# Patient Record
Sex: Male | Born: 1935 | Race: White | Hispanic: No | Marital: Married | Smoking: Never smoker
Health system: Southern US, Community
[De-identification: ages and names within clinical notes are randomized; demographics above are authoritative.]

## PROBLEM LIST (undated history)

## (undated) DIAGNOSIS — K4090 Unilateral inguinal hernia, without obstruction or gangrene, not specified as recurrent: Secondary | ICD-10-CM

## (undated) DIAGNOSIS — C801 Malignant (primary) neoplasm, unspecified: Secondary | ICD-10-CM

## (undated) DIAGNOSIS — J449 Chronic obstructive pulmonary disease, unspecified: Secondary | ICD-10-CM

## (undated) DIAGNOSIS — R251 Tremor, unspecified: Secondary | ICD-10-CM

## (undated) DIAGNOSIS — M21372 Foot drop, left foot: Secondary | ICD-10-CM

## (undated) DIAGNOSIS — M1990 Unspecified osteoarthritis, unspecified site: Secondary | ICD-10-CM

## (undated) DIAGNOSIS — H9190 Unspecified hearing loss, unspecified ear: Secondary | ICD-10-CM

## (undated) HISTORY — PX: WRIST FRACTURE SURGERY: SHX121

## (undated) HISTORY — PX: HEMORRHOID SURGERY: SHX153

## (undated) HISTORY — PX: EYE SURGERY: SHX253

## (undated) HISTORY — PX: ANKLE FRACTURE SURGERY: SHX122

---

## 1998-03-24 HISTORY — PX: JOINT REPLACEMENT: SHX530

## 2007-03-25 HISTORY — PX: ANKLE FRACTURE SURGERY: SHX122

## 2007-09-09 ENCOUNTER — Emergency Department: Payer: Self-pay | Admitting: Emergency Medicine

## 2007-12-02 ENCOUNTER — Inpatient Hospital Stay (HOSPITAL_COMMUNITY): Admission: EM | Admit: 2007-12-02 | Discharge: 2007-12-07 | Payer: Self-pay | Admitting: Emergency Medicine

## 2007-12-07 ENCOUNTER — Encounter: Payer: Self-pay | Admitting: Internal Medicine

## 2010-08-06 NOTE — Discharge Summary (Signed)
NAME:  Steven Greene, Steven Greene NO.:  [id number]   MEDICAL RECORD NO.:  [id number]          PATIENT TYPE:  INP   LOCATION:  1614                         FACILITY:  [facility]   PHYSICIAN:  Burnard Bunting, M.D.    DATE OF BIRTH:  1920-01-26   DATE OF ADMISSION:  12/02/2007  DATE OF DISCHARGE:  12/07/2007                               DISCHARGE SUMMARY   ADMISSION DIAGNOSES:  1. Trimalleolar ankle fracture.  2. Status post right total hip arthroplasty.  3. History of prostate cancer treated nonsurgically.  4. Arthritis.  5. Benign tremor.  6. Questionable spinal stenosis with chronic left foot drop.  7. Recent left distal radius fracture treated with casting.   DISCHARGE DIAGNOSES:  1. Trimalleolar ankle fracture.  2. Status post right total hip arthroplasty.  3. History of prostate cancer treated nonsurgically.  4. Arthritis.  5. Benign tremor.  6. Questionable spinal stenosis with chronic left foot drop.  7. Recent left distal radius fracture treated with casting.   PROCEDURE:  On December 02, 2007, patient underwent open reduction and  internal fixation of left trimalleolar ankle fracture performed by Dr.  August Saucer under general anesthesia.   CONSULTATIONS:  None.   BRIEF HISTORY:  The patient is an 77 year old male who fell injuring his  left ankle on the day of admission.  He apparently was going up some  stairs, and he does have a chronic left foot drop when his foot caught  on the stairs and he fell injuring his left ankle.  He was brought to  [facility] Emergency Room.  Evaluation showed obvious deformity of the  left ankle.  Radiographs indicated a displaced trimalleolar ankle  fracture dislocation.  It was felt that he would require surgical  intervention and was admitted for the procedure as stated above.   BRIEF HOSPITAL COURSE:  The patient tolerated the procedure under  general anesthesia without complications.  Postoperatively, he was  started on Coumadin  for DVT prophylaxis.  Adjustments in Coumadin dose  were made according to daily pro times by the pharmacist at [facility].  At discharge, INR was noted to be 1.1, and his dose of Coumadin at  discharge was recommended to be 7.5 mg daily until he was therapeutic  with an INR of 2 to 2.5.  Neurovascular motor function of the left toes  was intact throughout the hospital stay.  Splint was intact.  Patient's  vital signs were stable throughout the hospital stay.  Patient underwent  physical therapy evaluation for bed to chair transfers as well as  ambulation.  He was nonweightbearing to the operative extremity.  He did  require use of a platform walker to the left upper extremity as he has  recently had a left distal radius fracture.  Patient did ambulate as  much as 45 feet utilizing the left platform rolling walker and was able  to maintain the nonweightbearing status of the left foot with verbal  cueing.  He did require moderate assistance.  Patient received a  clinical social work consult in regards to discharge planning.  Being  that he did live alone, it was felt he may benefit from prolonged  rehabilitation at outlying nursing facility.  Physical therapy did  recommend that he have skilled nursing facility to allow for 24-hour  care for his safety.  FL-2 form was signed and faxed to area nursing  facilities.  A bed offer was made at [facility] which patient accepted on December 07, 2007.  At the time of  discharge, patient was taking a regular diet.  He was having regular  bowel movements and was urinating without difficulty.   LABORATORY VALUES:  Pertinent laboratory values on admission:  CBC with  hemoglobin 12.9, hematocrit 38; hemoglobin and hematocrit  postoperatively 9.8 and 28.9 respectively.  INR at discharge 1.1.  Chemistry studies on admission with sodium 133, glucose 105.  Repeat  chemistries on 12/03/2007:  Sodium 129, chloride 94, glucose  133 and  calcium 8.3.   EKG on admission showed normal sinus rhythm, left axis deviation  confirmed by Dr. Margarita Grizzle.   PLAN:  Patient will be transferred to [facility].  There, he should receive physical therapy on a daily basis.  He will  utilize a platform rolling walker to the left upper extremity.  He is  nonweightbearing on the left lower extremity.  Patient should have  ambulation and gait training.  His splint should be kept dry, clean and  intact at all times.  Patient should receive occupational therapy for  ADLs if he does plan to return home on an independent basis once he is  stable.  Patient is advised to return to clinic to see Dr. August Saucer on  December 10, 2007, and asked to call to arrange the time.  As he is  going to a facility, the facility should assist him with this and also  provide transportation.  Patient should continue on a regular diet.  He  is on Coumadin for DVT prophylaxis.  Dietary adjustments to be made for  this avoiding foods with high content of vitamin K.   OTHER MEDICATIONS AT DISCHARGE:  1. Continue on Coumadin for 4 weeks.  It is recommended that he      receive 5 mg daily.  Weekly blood draws for PT, PTT and INR.  2. Percocet 5/325 one q.4-6 h. as needed for pain.  3. Inderal 20 mg b.i.d.  4. Os-Cal 500 mg p.o. daily.  5. Pepcid 20 mg p.o. b.i.d.  6. Robaxin 500 mg 1 q.8 h. as needed for spasm.  7. Maalox as needed for indigestion, laxative on enema of choice.  8. Colace 1 p.o. b.i.d. for constipation.   All questions encouraged and answered prior to discharge.  If there are  questions or concerns regarding his orthopedic status, please notify Dr.  Diam[facility] office at [phone].      Wende Neighbors, P.A.      Burnard Bunting, M.D.  Electronically Signed    SMV/MEDQ  D:  12/07/2007  T:  12/07/2007  Job:  [id number]

## 2010-08-06 NOTE — Op Note (Signed)
NAME:  Steven Greene, Steven Greene NO.:  [id number]   MEDICAL RECORD NO.:  [id number]          PATIENT TYPE:  INP   LOCATION:  1610                         FACILITY:  [facility]   PHYSICIAN:  Burnard Bunting, M.D.    DATE OF BIRTH:  1919/08/04   DATE OF PROCEDURE:  12/02/2007  DATE OF DISCHARGE:                               OPERATIVE REPORT   PREOPERATIVE DIAGNOSIS:  Left trimalleolar ankle fracture.   POSTOPERATIVE DIAGNOSIS:  Left trimalleolar ankle fracture.   PROCEDURE:  Left trimalleolar ankle fracture open reduction and internal  fixation.   SURGEON:  Burnard Bunting, M.D.   ASSISTANT:  None.   ANESTHESIA:  General endotracheal anesthesia.   ESTIMATED BLOOD LOSS:  Minimal.   INDICATIONS FOR PROCEDURE:  Steven Greene is an 77 year old ambulatory  patient who fell on his left ankle, sustained a closed trimalleolar  ankle fracture, presents now for operative management after adequate  explanation of risks and benefits.   DESCRIPTION OF PROCEDURE:  Patient brought to the operating room where  general endotracheal anesthesia was induced.  Preoperative antibiotics  administered.  Time-out was called.  Left leg was then prepped and  prescrubbed with alcohol and Betadine which was allowed to air dry, then  prepped with DuraPrep solution.  Collier Flowers was covered over the operative  field on both sides of the ankle.  Leg was elevated.  Ankle Esmarch was  utilized for approximately an hour.  Lateral incision was made.  Skin  and subcutaneous tissue was sharply divided.  Periosteal flaps were  elevated off of the fracture site.  Fracture site was irrigated and  reduced on the lateral side and it was held in position with a lag screw  placed from anterior proximal to posterior distal.  Good position of the  screw was achieved.  Good reduction of the fracture was achieved.  A  lateral malleolar plate was then applied with both locking and  nonlocking screws.  The plate fit well on the  distal fibula.  At this  time, incision was thoroughly irrigated.  A medial incision was then  made over the medial malleolar fracture.  Skin and subcutaneous tissue  sharply divided.  Periosteum was moved from the fracture site.  The  fracture was then reduced and 2 malleolar screws were placed across the  fracture site with reasonable purchase obtained.  At this time, under  fluoroscopic guidance, reduction was confirmed in the AP and lateral  planes.  Tourniquet was released.  Bleeding points encountered were  stopped with  electrocautery.  Thorough irrigation was performed on both incisions  which were then closed with 2-0 Vicryl suture and interrupted inverted 3-  0 simple nylon sutures.  Bulky dressing and posterior splint was  applied.  Patient tolerated the procedure well without immediate  complications.      Burnard Bunting, M.D.  Electronically Signed     GSD/MEDQ  D:  12/02/2007  T:  12/03/2007  Job:  [id number]

## 2010-08-06 NOTE — H&P (Signed)
NAME:  Steven Greene NO.:  [id number]   MEDICAL RECORD NO.:  [id number]          PATIENT TYPE:  INP   LOCATION:  1614                         FACILITY:  [facility]   PHYSICIAN:  Burnard Bunting, M.D.    DATE OF BIRTH:  Jul 19, 1919   DATE OF ADMISSION:  12/02/2007  DATE OF DISCHARGE:                              HISTORY & PHYSICAL   CHIEF COMPLAINT:  Left ankle pain.   HISTORY OF PRESENT ILLNESS:  Steven Greene is an active 77 year old [place] foot drop who tripped  while going upstairs today.  He represented obvious deformity and pain  in the left ankle.  He went to [facility] Emergency Room for  evaluation.  He was noted to have a trimalleolar ankle fracture  dislocation and his ankle was splinted.  The patient denies any  orthopedic complaints.   PAST MEDICAL HISTORY:  Notable for:  1. Prostate cancer with nonsurgical treatment elected.  He has had      total hip replacement done at [facility].  2. Recent fracture of the left distal radius.  3. Arthritis.  4. Benign tumor.  5. Questionable spinal problem leading to his left foot drop which was      never essentially worked up.   FAMILY HISTORY:  Positive for father with tremor and alcoholism.  [place]  Walks with a cane occasionally.  Is  independent with ADLs.  Denies any tobacco or drug use.   ALLERGIES:  He has no known drug allergies.   CURRENT MEDICATIONS:  Meloxicam, propranolol, calcium, Zantac.   PRIMARY CARE:  [facility].   All other systems are reviewed and are negative.  No recent fever,  chills, chest pain, shortness of breath.  No history of a DVT in the  family.   PHYSICAL EXAMINATION:  His temperature is 98.5.  Pulse 69.  Respiration  20.  Blood pressure 147/78.  CHEST:  Clear to auscultation.  Heartbeat is regular rate and rhythm.  ABDOMEN:  Benign.  He is well-developed, well-nourished, no acute distress.   Alert and  oriented.  Left lower extremity is splinted.  His foot is perfused.  DP pulse is 1+  out of 4.  EHL 1 out of 5.  Ankle dorsiflexion 0-5.  Tibialis anterior  __________ .   Three views left ankle show a trimalleolar ankle fracture dislocation.  Chest x-ray bibasilar atelectasis.   IMPRESSION:  Left trimalleolar ankle fracture.   PLAN:  Patient is active and walking and patient needs open reduction  with internal fixation.  Risk and benefits discussed with the patient  and family including but not limited to infection, deep vein thrombosis  and decreased ankle mobility, possible death.  All questions answered.      Burnard Bunting, M.D.  Electronically Signed     GSD/MEDQ  D:  12/02/2007  T:  12/03/2007  Job:  [id number]

## 2010-12-23 LAB — PROTIME-INR
INR: 1.1
Prothrombin Time: 14.7

## 2010-12-25 LAB — CBC
Hemoglobin: 9.8 — ABNORMAL LOW
Platelets: 143 — ABNORMAL LOW
Platelets: 204
RDW: 13.8
WBC: 7.1

## 2010-12-25 LAB — BASIC METABOLIC PANEL
Calcium: 8.3 — ABNORMAL LOW
Creatinine, Ser: 1.09
GFR calc non Af Amer: >60
Glucose, Bld: 133 — ABNORMAL HIGH
Sodium: 129 — ABNORMAL LOW

## 2010-12-25 LAB — PROTIME-INR
INR: 1
INR: 1.1
Prothrombin Time: 13.5
Prothrombin Time: 13.6
Prothrombin Time: 14

## 2010-12-25 LAB — DIFFERENTIAL
Eosinophils Absolute: 0.1
Lymphocytes Relative: 14
Lymphs Abs: 1
Neutrophils Relative %: 78 — ABNORMAL HIGH

## 2010-12-25 LAB — POCT I-STAT, CHEM 8
BUN: 13
Creatinine, Ser: 1.1
Hemoglobin: 12.9 — ABNORMAL LOW
Potassium: 5
Sodium: 133 — ABNORMAL LOW

## 2011-05-15 ENCOUNTER — Ambulatory Visit: Payer: Self-pay | Admitting: Oncology

## 2011-05-23 ENCOUNTER — Ambulatory Visit: Payer: Self-pay | Admitting: Oncology

## 2011-06-23 ENCOUNTER — Ambulatory Visit: Payer: Self-pay | Admitting: Oncology

## 2011-08-01 ENCOUNTER — Emergency Department (HOSPITAL_COMMUNITY): Payer: Medicare Other

## 2011-08-01 ENCOUNTER — Encounter (HOSPITAL_COMMUNITY): Payer: Self-pay | Admitting: *Deleted

## 2011-08-01 ENCOUNTER — Emergency Department (HOSPITAL_COMMUNITY)
Admission: EM | Admit: 2011-08-01 | Discharge: 2011-08-01 | Disposition: A | Discharge status: Home / Self Care | Payer: Medicare Other | Attending: Emergency Medicine | Admitting: Emergency Medicine

## 2011-08-01 DIAGNOSIS — S82143A Displaced bicondylar fracture of unspecified tibia, initial encounter for closed fracture: Secondary | ICD-10-CM

## 2011-08-01 DIAGNOSIS — S52509A Unspecified fracture of the lower end of unspecified radius, initial encounter for closed fracture: Secondary | ICD-10-CM | POA: Insufficient documentation

## 2011-08-01 DIAGNOSIS — M25539 Pain in unspecified wrist: Secondary | ICD-10-CM | POA: Insufficient documentation

## 2011-08-01 DIAGNOSIS — Y9229 Other specified public building as the place of occurrence of the external cause: Secondary | ICD-10-CM | POA: Insufficient documentation

## 2011-08-01 DIAGNOSIS — W010XXA Fall on same level from slipping, tripping and stumbling without subsequent striking against object, initial encounter: Secondary | ICD-10-CM | POA: Insufficient documentation

## 2011-08-01 DIAGNOSIS — S52501A Unspecified fracture of the lower end of right radius, initial encounter for closed fracture: Secondary | ICD-10-CM

## 2011-08-01 DIAGNOSIS — S82109A Unspecified fracture of upper end of unspecified tibia, initial encounter for closed fracture: Secondary | ICD-10-CM | POA: Insufficient documentation

## 2011-08-01 DIAGNOSIS — M25569 Pain in unspecified knee: Secondary | ICD-10-CM | POA: Insufficient documentation

## 2011-08-01 HISTORY — DX: Tremor, unspecified: R25.1

## 2011-08-01 LAB — CBC
HCT: 37.6 % — ABNORMAL LOW (ref 39.0–52.0)
MCH: 30.8 pg (ref 26.0–34.0)
MCHC: 33 g/dL (ref 30.0–36.0)
MCV: 93.3 fL (ref 78.0–100.0)
RDW: 14.1 % (ref 11.5–15.5)

## 2011-08-01 LAB — DIFFERENTIAL
Basophils Absolute: 0 10*3/uL (ref 0.0–0.1)
Basophils Relative: 0 % (ref 0–1)
Eosinophils Relative: 1 % (ref 0–5)
Monocytes Absolute: 0.6 10*3/uL (ref 0.1–1.0)
Monocytes Relative: 6 % (ref 3–12)

## 2011-08-01 LAB — POCT I-STAT, CHEM 8
BUN: 17 mg/dL (ref 6–23)
Calcium, Ion: 1.22 mmol/L (ref 1.12–1.32)
HCT: 38 % — ABNORMAL LOW (ref 39.0–52.0)
Hemoglobin: 12.9 g/dL — ABNORMAL LOW (ref 13.0–17.0)
Sodium: 141 mEq/L (ref 135–145)
TCO2: 23 mmol/L (ref 0–100)

## 2011-08-01 MED ORDER — HYDROCODONE-ACETAMINOPHEN 5-325 MG PO TABS
1.0000 | ORAL_TABLET | Freq: Once | ORAL | Status: AC
  Administered 2011-08-01: 1 via ORAL
  Filled 2011-08-01: qty 1

## 2011-08-01 MED ORDER — ONDANSETRON 4 MG PO TBDP
ORAL_TABLET | ORAL | Status: AC
  Administered 2011-08-01: 8 mg via ORAL
  Filled 2011-08-01: qty 2

## 2011-08-01 MED ORDER — ONDANSETRON HCL 4 MG/2ML IJ SOLN: 4.0000 mg | Freq: Once | INTRAMUSCULAR | Status: DC

## 2011-08-01 MED ORDER — HYDROCODONE-ACETAMINOPHEN 5-325 MG PO TABS: 1.0000 | ORAL_TABLET | ORAL | Status: AC | PRN

## 2011-08-01 MED ORDER — MORPHINE SULFATE 4 MG/ML IJ SOLN: 4.0000 mg | Freq: Once | INTRAMUSCULAR | Status: DC

## 2011-08-01 NOTE — ED Notes (Signed)
Patient becoming nauseated with movement.  PA aware.  New orders.

## 2011-08-01 NOTE — Discharge Instructions (Signed)
Cast or Splint Care Casts and splints support injured limbs and keep bones from moving while they heal.  HOME CARE  Keep the cast or splint uncovered during the drying period.   A plaster cast can take 24 to 48 hours to dry.   A fiberglass cast will dry in less than 1 hour.   Do not rest the cast on anything harder than a pillow for 24 hours.   Do not put weight on your injured limb. Do not put pressure on the cast. Wait for your doctor's approval.   Keep the cast or splint dry.   Cover the cast or splint with a plastic bag during baths or wet weather.   If you have a cast over your chest and belly (trunk), take sponge baths until the cast is taken off.   Keep your cast or splint clean. Wash a dirty cast with a damp cloth.   Do not put any objects under your cast or splint. Do not scratch the skin under the cast with an object.   Do not take out the padding from inside your cast.   Exercise your joints near the cast as told by your doctor.   Raise (elevate) your injured limb on 1 or 2 pillows for the first 1 to 3 days.  GET HELP RIGHT AWAY IF:  Your cast or splint cracks.   Your cast or splint is too tight or too loose.   You itch badly under the cast.   Your cast gets wet or has a soft spot.   You have a bad smell coming from the cast.   You get an object stuck under the cast.   Your skin around the cast becomes red or raw.   You have new or more pain after the cast is put on.   You have fluid leaking through the cast.   You cannot move your fingers or toes.   Your fingers or toes turn colors or are cool, painful, or puffy (swollen).   You have tingling or lose feeling (numbness) around the injured area.   You have pain or pressure under the cast.   You have trouble breathing or have shortness of breath.   You have chest pain.  MAKE SURE YOU:  Understand these instructions.   Will watch your condition.   Will get help right away if you are not doing  well or get worse.  Document Released: 07/10/2010 Document Revised: 02/27/2011 Document Reviewed: 07/10/2010 [facility] Patient Information 2012 [place], [place].Knee Fracture, Adult A knee fracture is a break in any of the bones of the lower part of the thigh bone, the upper part of the bones of the lower leg, or of the kneecap. When the bones no longer meet the way they are supposed to it is called a dislocation. Sometimes there can be a dislocation along with fractures. SYMPTOMS  Symptoms may include pain, swelling, inability to bend the knee, deformity of the knee, and inability to walk.  DIAGNOSIS  This problem is usually diagnosed with x-rays. Special studies are sometimes done if a fracture is suspected but cannot be seen on ordinary x-rays. If vessels around the knee are injured, special tests may be done to see what the damage is. TREATMENT   The leg is usually splinted for the first couple of days to allow for swelling. After the swelling is down a cast is put on. Sometimes a cast is put on right away with the sides of the  cast cut to allow the knee to swell. If the bones are in place, this may be all that is needed.   If the bones are out of place, medications for pain are given to allow them to be put back in place. If they are seriously out of place, surgery may be needed to hold the pieces or breaks in place using wires, pins, screws or metal plates.   Generally most fractures will heal in 4 to 6 weeks.  HOME CARE INSTRUCTIONS   Use your crutches as directed.   To lessen the swelling, keep the injured leg elevated while sitting or lying down.   Apply ice to the injury for 15 to 20 minutes, 3 to 4 times per day while awake for 2 days. Put the ice in a plastic bag and place a thin towel between the bag of ice and your cast.   If you have a plaster or fiberglass cast:   Do not try to scratch the skin under the cast using sharp or pointed objects.   Check the skin around the cast  every day. You may put lotion on any red or sore areas.   Keep your cast dry and clean.   If you have a plaster splint:   Wear the splint as directed.   You may loosen the elastic around the splint if your toes become numb, tingle, or turn cold or blue.   Do not put pressure on any part of your cast or splint; it may break. Rest your cast only on a pillow the first 24 hours until it is fully hardened.   Your cast or splint can be protected during bathing with a plastic bag. Do not lower the cast or splint into water.   Only take over-the-counter or prescription medicines for pain, discomfort, or fever as directed by your caregiver.   See your caregiver soon if your cast gets damaged or breaks.   It is very important to keep all follow up appointments. Not following up as directed may result in a worsening of your condition or a failure of the fracture to heal properly.  SEEK IMMEDIATE MEDICAL CARE IF:  You have continued severe pain.   You have more swelling than you did before the cast was put on.   The area below the fracture becomes painful.   Your skin or toenails below the injury turn blue or gray, or feel cold or numb.   There is drainage coming from under the cast.   New, unexplained symptoms develop (drugs used in treatment may produce side effects).  MAKE SURE YOU:   Understand these instructions.   Will watch your condition.   Will get help right away if you are not doing well or get worse.  Document Released: 01/21/2006 Document Revised: 02/27/2011 Document Reviewed: 02/22/2007 [facility] Patient Information 2012 [place], [place].Tibial Fracture, Adult You have a fracture (break in bone) of your tibia. This is the large "shin" bone in your lower leg. These fractures are easily diagnosed with x-rays. TREATMENT  You have a simple fracture which usually will heal without disability. It can be treated with simple immobilization. This means the bone can be held with a  cast or splint in a favorable position until your caregiver feels it is stable (healed well enough). Then you can begin range of motion exercises to keep your knee and ankle limber (moving well). HOME CARE INSTRUCTIONS   Apply ice to the injury for 15 to 20 minutes, 3  to 4 times per day while awake, for 2 days. Put the ice in a plastic bag and place a thin towel between the bag of ice and your cast.   If you have a plaster or fiberglass cast:   Do not try to scratch the skin under the cast using sharp or pointed objects.   Check the skin around the cast every day. You may put lotion on any red or sore areas.   Keep your cast dry and clean.   If you have a plaster splint:   Wear the splint as directed.   You may loosen the elastic around the splint if your toes become numb, tingle, or turn cold or blue.   Do not put pressure on any part of your cast or splint until it is fully hardened.   Your cast or splint can be protected during bathing with a plastic bag. Do not lower the cast or splint into water.   Use crutches as directed.   Only take over-the-counter or prescription medicines for pain, discomfort, or fever as directed by your caregiver.   See your caregiver as directed. It is very important to keep all follow-up referrals and appointments in order to avoid any long-term problems with your leg and ankle including chronic pain, inability to move the ankle normally, failure of the fracture to heal and permanent disability.  SEEK IMMEDIATE MEDICAL CARE IF:   Pain is becoming worse rather than better, or if pain is uncontrolled with medications.   You have increased swelling, pain, or redness in the foot.   You begin to lose feeling in your foot or toes.   You develop a cold or blue foot or toes on the injured side.   You develop severe pain in your injured leg, especially if it is increased with movement of your toes.  Document Released: 12/03/2000 Document Revised:  02/27/2011 Document Reviewed: 06/27/2008 [facility] Patient Information 2012 [place], [place].

## 2011-08-01 NOTE — ED Notes (Signed)
Pt to xray, pt remains clothed, C/o R wrist & R knee injury, palpable pulses, CMS intact, pedal edema noted, +3 pitting, denies pain at this time, declines pain med. Alert, NAD, calm, interactive, skin W&D, neighbor and family at [facility].

## 2011-08-01 NOTE — ED Provider Notes (Signed)
History     CSN: [id number]  Arrival date & time 08/01/11  1836   First MD Initiated Contact with Patient 08/01/11 2003      Chief Complaint  Patient presents with  . Fall  . Wrist Injury    (Consider location/radiation/quality/duration/timing/severity/associated sxs/prior treatment) HPI Comments: Patient here with family after the was at the gas station and went in to pay tripping and falling on his right wrist and right knee - he has a recent history of frequent falls due to spinal stenosis and chronic left foot drop - he denies LOC or passing out before the fall - denies head injury, blurred vision, nausea, vomiting, has sensation distal to the injuries.    Patient is a 78 y.o. male presenting with fall and wrist injury. The history is provided by the patient. No language interpreter was used.  Fall The accident occurred 1 to 2 hours ago. The fall occurred while walking. He fell from a height of 3 to 5 ft. He landed on concrete. There was no blood loss. The point of impact was the right wrist and right knee. The pain is present in the right wrist and right knee. The pain is at a severity of 6/10. The pain is moderate. He was ambulatory at the scene. There was no entrapment after the fall. There was no drug use involved in the accident. There was no alcohol use involved in the accident. Pertinent negatives include no visual change, no fever, no numbness, no abdominal pain, no bowel incontinence, no nausea, no vomiting, no hematuria, no headaches, no hearing loss, no loss of consciousness and no tingling. The symptoms are aggravated by activity.  Wrist Injury  Pertinent negatives include no fever.    Past Medical History  Diagnosis Date  . Tremors of nervous system     History reviewed. No pertinent past surgical history.  History reviewed. No pertinent family history.  History  Substance Use Topics  . Smoking status: Never Smoker   . Smokeless tobacco: Not on file  . Alcohol  Use: No      Review of Systems  Constitutional: Negative for fever.  Gastrointestinal: Negative for nausea, vomiting, abdominal pain and bowel incontinence.  Genitourinary: Negative for hematuria.  Neurological: Negative for tingling, loss of consciousness, numbness and headaches.  All other systems reviewed and are negative.    Allergies  Review of patient's allergies indicates no known allergies.  Home Medications   Current Outpatient Rx  Name Route Sig Dispense Refill  . FLUTICASONE-SALMETEROL 250-50 MCG/DOSE IN AEPB Inhalation Inhale 1 puff into the lungs every 12 (twelve) hours as needed. For shortness of breath    . MELOXICAM 7.5 MG PO TABS Oral Take 7.5 mg by mouth daily.    Marland Kitchen PROPRANOLOL HCL 20 MG PO TABS Oral Take 20 mg by mouth daily.      BP 154/72  Pulse 71  Temp(Src) 98.1 F (36.7 C) (Oral)  Resp 16  SpO2 96%  Physical Exam  Nursing note and vitals reviewed. Constitutional: He is oriented to person, place, and time. He appears well-developed and well-nourished. No distress.  HENT:  Head: Normocephalic and atraumatic.  Right Ear: External ear normal.  Left Ear: External ear normal.  Nose: Nose normal.  Mouth/Throat: Oropharynx is clear and moist. No oropharyngeal exudate.  Eyes: Conjunctivae are normal. Pupils are equal, round, and reactive to light. No scleral icterus.  Neck: Normal range of motion. Neck supple. No spinous process tenderness and no muscular tenderness present.  Cardiovascular: Normal rate, regular rhythm and normal heart sounds.  Exam reveals no gallop and no friction rub.   No murmur heard. Pulmonary/Chest: Effort normal and breath sounds normal. No respiratory distress. He has no wheezes. He has no rales. He exhibits no tenderness.  Abdominal: Soft. Bowel sounds are normal. He exhibits no distension. There is no tenderness.  Musculoskeletal:       Right wrist: He exhibits tenderness, bony tenderness, swelling and deformity. He exhibits  normal range of motion.       Right knee: He exhibits decreased range of motion, swelling, effusion and bony tenderness. tenderness found.  Lymphadenopathy:    He has no cervical adenopathy.  Neurological: He is alert and oriented to person, place, and time. No cranial nerve deficit.  Skin: Skin is warm and dry. No rash noted. No erythema. No pallor.  Psychiatric: He has a normal mood and affect. His behavior is normal. Judgment and thought content normal.    ED Course  Procedures (including critical care time)  Labs Reviewed  CBC - Abnormal; Notable for the following:    RBC 4.03 (*)    Hemoglobin 12.4 (*)    HCT 37.6 (*)    All other components within normal limits  DIFFERENTIAL - Abnormal; Notable for the following:    Neutrophils Relative 82 (*)    Neutro Abs 8.1 (*)    Lymphocytes Relative 11 (*)    All other components within normal limits   Dg Wrist Complete Right  08/01/2011  *RADIOLOGY REPORT*  Clinical Data: Fall, wrist injury  RIGHT WRIST - COMPLETE 3+ VIEW  Comparison: None.  Findings: Four views of the right wrist submitted.  There is diffuse osteopenia.  Mild impacted fracture in distal right radius. This is best visualized on lateral view.  Subtle tiny avulsion fracture of the tip of the ulnar styloid.  IMPRESSION:  There is diffuse osteopenia.  Mild impacted fracture in distal right radius.  This is best visualized on lateral view.  Subtle tiny avulsion fracture of the tip of the ulnar styloid.  Original Report Authenticated By: Natasha Mead, M.D.   Dg Knee Complete 4 Views Right  08/01/2011  *RADIOLOGY REPORT*  Clinical Data: Fall  RIGHT KNEE - COMPLETE 4+ VIEW  Comparison: None.  Findings: Four views of the right knee submitted.  There is diffuse osteopenia.  Mild narrowing of medial joint compartment.  Moderate joint effusion. No displaced fracture or subluxation.  In oblique views there is cortical irregularity and cortical step-off lateral aspect of proximal tibia,  lateral tibial plateau suspicious for fracture.  Clinical correlation is necessary.  Further evaluation with CT scan could be performed.  IMPRESSION: Moderate joint effusion. No displaced fracture or subluxation.  In oblique views there is cortical irregularity and cortical step-off lateral aspect of proximal tibia, lateral tibial plateau suspicious for fracture.  Clinical correlation is necessary.  Further evaluation with CT scan could be performed.  Original Report Authenticated By: Natasha Mead, M.D.     Right tibial plateau fracture Right distal radius fracture    MDM  Patient is elderly gentleman with mechanical fall who presents with right distal radius fracture and small right tibial plateau fracture - Dr. Ignacia Palma has seen the patient with me, Dr. Merlyn Lot will follow the patient for the Colles fracture and Dr. Despina Hick for the tibial plateau fracture.       Izola Price [place], [place] 08/01/11 2339

## 2011-08-01 NOTE — ED Notes (Signed)
Patient with swollen right wrist and swollen right knee after fall.  Patient is CAOx3, HOH.  Patient states his pain is 3/10.  Patient declines any pain medication at this time.

## 2011-08-01 NOTE — ED Notes (Signed)
Patient being placed in gown at this time.

## 2011-08-01 NOTE — ED Notes (Signed)
Pt states that he has drop foot and got tangled up, falling forward and bracing self with right wrist, pt reports hitting right knee on ground also. Pt with swelling noted to right wrist, pt able to make fist and flex extend wrist but this is limited. Pulse good.

## 2011-08-01 NOTE — ED Notes (Signed)
Ortho tech paged  

## 2011-08-02 NOTE — ED Provider Notes (Signed)
Medical screening examination/treatment/procedure(s) were conducted as a shared visit with non-physician practitioner(s) and myself.  I personally evaluated the patient during the encounter   78 year old man fell, injuring the right wrist and right knee.  Exam showed no deformity or point tenderness of the wrist, and x-ray of the right wrist showed an undisplaced colles' fracture.  Discussed with Betha Loa, M.D., on call for hand surgery, who advised use of a volar splint and office followup.  His right knee had a large effusion, and CT of the right knee showed a small lateral tibial plateau fracture.  Discussed this with Ollen Gross, M.D., on call for orthopedics, who advised a knee immobilizer, ice, elevation, and office followup.    Carleene Cooper III, MD 08/02/11 1302

## 2011-08-06 ENCOUNTER — Encounter (HOSPITAL_BASED_OUTPATIENT_CLINIC_OR_DEPARTMENT_OTHER): Payer: Self-pay | Admitting: *Deleted

## 2011-08-06 ENCOUNTER — Other Ambulatory Visit: Payer: Self-pay | Admitting: Orthopedic Surgery

## 2011-08-06 ENCOUNTER — Other Ambulatory Visit: Payer: Self-pay

## 2011-08-06 NOTE — Progress Notes (Signed)
Daughter brought pt in-nice man-fell-fx rt wrist and rt ? Knee-splint on knee and wrist-using walker-daughter says his memory is good, lives alone-cares for self-has an old lt drop ft from spinal stenosis-hx tremors-takes inderal-has never had any cardiac issues -never seen cardiology-has had asthma in past-not used advair in yrs. ekg done-labs done in er 08/01/11

## 2011-08-07 ENCOUNTER — Encounter (HOSPITAL_BASED_OUTPATIENT_CLINIC_OR_DEPARTMENT_OTHER): Payer: Self-pay

## 2011-08-07 ENCOUNTER — Encounter (HOSPITAL_BASED_OUTPATIENT_CLINIC_OR_DEPARTMENT_OTHER): Payer: Self-pay | Admitting: Anesthesiology

## 2011-08-07 ENCOUNTER — Ambulatory Visit (HOSPITAL_BASED_OUTPATIENT_CLINIC_OR_DEPARTMENT_OTHER)
Admission: RE | Admit: 2011-08-07 | Discharge: 2011-08-07 | Disposition: A | Discharge status: Home / Self Care | Payer: Medicare Other | Source: Ambulatory Visit | Attending: Orthopedic Surgery | Admitting: Orthopedic Surgery

## 2011-08-07 ENCOUNTER — Ambulatory Visit (HOSPITAL_BASED_OUTPATIENT_CLINIC_OR_DEPARTMENT_OTHER): Payer: Medicare Other | Admitting: Anesthesiology

## 2011-08-07 ENCOUNTER — Encounter (HOSPITAL_BASED_OUTPATIENT_CLINIC_OR_DEPARTMENT_OTHER)
Admission: RE | Disposition: A | Discharge status: Home / Self Care | Payer: Self-pay | Source: Ambulatory Visit | Attending: Orthopedic Surgery

## 2011-08-07 ENCOUNTER — Encounter (HOSPITAL_BASED_OUTPATIENT_CLINIC_OR_DEPARTMENT_OTHER): Payer: Self-pay | Admitting: Orthopedic Surgery

## 2011-08-07 DIAGNOSIS — W010XXA Fall on same level from slipping, tripping and stumbling without subsequent striking against object, initial encounter: Secondary | ICD-10-CM | POA: Insufficient documentation

## 2011-08-07 DIAGNOSIS — J449 Chronic obstructive pulmonary disease, unspecified: Secondary | ICD-10-CM | POA: Insufficient documentation

## 2011-08-07 DIAGNOSIS — J4489 Other specified chronic obstructive pulmonary disease: Secondary | ICD-10-CM | POA: Insufficient documentation

## 2011-08-07 DIAGNOSIS — S52599A Other fractures of lower end of unspecified radius, initial encounter for closed fracture: Secondary | ICD-10-CM | POA: Insufficient documentation

## 2011-08-07 HISTORY — DX: Foot drop, left foot: M21.372

## 2011-08-07 HISTORY — DX: Unspecified osteoarthritis, unspecified site: M19.90

## 2011-08-07 HISTORY — DX: Chronic obstructive pulmonary disease, unspecified: J44.9

## 2011-08-07 HISTORY — DX: Unspecified hearing loss, unspecified ear: H91.90

## 2011-08-07 HISTORY — DX: Malignant (primary) neoplasm, unspecified: C80.1

## 2011-08-07 LAB — POCT HEMOGLOBIN-HEMACUE: Hemoglobin: 12.5 g/dL — ABNORMAL LOW (ref 13.0–17.0)

## 2011-08-07 SURGERY — OPEN REDUCTION INTERNAL FIXATION (ORIF) DISTAL RADIUS FRACTURE
Anesthesia: Moderate Sedation | Site: Wrist | Laterality: Right | Wound class: Clean

## 2011-08-07 MED ORDER — CEFAZOLIN SODIUM-DEXTROSE 2-3 GM-% IV SOLR: 2.0000 g | Freq: Once | INTRAVENOUS | Status: DC

## 2011-08-07 MED ORDER — LACTATED RINGERS IV SOLN
INTRAVENOUS | Status: DC
  Administered 2011-08-07: 10:00:00 via INTRAVENOUS

## 2011-08-07 MED ORDER — ROPIVACAINE HCL 5 MG/ML IJ SOLN
INTRAMUSCULAR | Status: DC | PRN
  Administered 2011-08-07: 25 mL via EPIDURAL

## 2011-08-07 MED ORDER — FENTANYL CITRATE 0.05 MG/ML IJ SOLN: 25.0000 ug | INTRAMUSCULAR | Status: DC | PRN

## 2011-08-07 MED ORDER — LIDOCAINE HCL 1 % IJ SOLN
INTRAMUSCULAR | Status: DC | PRN
  Administered 2011-08-07: 2 mL via INTRADERMAL

## 2011-08-07 MED ORDER — METOCLOPRAMIDE HCL 5 MG/ML IJ SOLN: 10.0000 mg | Freq: Once | INTRAMUSCULAR | Status: DC | PRN

## 2011-08-07 MED ORDER — CEFAZOLIN SODIUM 1-5 GM-% IV SOLN: 1.0000 g | INTRAVENOUS | Status: DC

## 2011-08-07 MED ORDER — LIDOCAINE HCL (CARDIAC) 20 MG/ML IV SOLN
INTRAVENOUS | Status: DC | PRN
  Administered 2011-08-07: 50 mg via INTRAVENOUS

## 2011-08-07 MED ORDER — HYDROCODONE-ACETAMINOPHEN 5-325 MG PO TABS: ORAL_TABLET | ORAL | Status: DC

## 2011-08-07 MED ORDER — FENTANYL CITRATE 0.05 MG/ML IJ SOLN
INTRAMUSCULAR | Status: DC | PRN
  Administered 2011-08-07: 25 ug via INTRAVENOUS

## 2011-08-07 MED ORDER — FENTANYL CITRATE 0.05 MG/ML IJ SOLN
50.0000 ug | INTRAMUSCULAR | Status: DC | PRN
  Administered 2011-08-07: 50 ug via INTRAVENOUS

## 2011-08-07 MED ORDER — PROPOFOL 10 MG/ML IV EMUL
INTRAVENOUS | Status: DC | PRN
  Administered 2011-08-07: 30 ug/kg/min via INTRAVENOUS

## 2011-08-07 MED ORDER — CHLORHEXIDINE GLUCONATE 4 % EX LIQD: 60.0000 mL | Freq: Once | CUTANEOUS | Status: DC

## 2011-08-07 MED ORDER — MIDAZOLAM HCL 2 MG/2ML IJ SOLN
0.5000 mg | INTRAMUSCULAR | Status: DC | PRN
  Administered 2011-08-07: 1 mg via INTRAVENOUS

## 2011-08-07 SURGICAL SUPPLY — 75 items
BANDAGE CONFORM 3  STR LF (GAUZE/BANDAGES/DRESSINGS) IMPLANT
BANDAGE ELASTIC 3 VELCRO ST LF (GAUZE/BANDAGES/DRESSINGS) ×2 IMPLANT
BANDAGE GAUZE ELAST BULKY 4 IN (GAUZE/BANDAGES/DRESSINGS) ×2 IMPLANT
BIT DRILL 2.0 LNG QUCK RELEASE (BIT) ×1 IMPLANT
BIT DRILL 2.8 QUICK RELEASE (BIT) ×1 IMPLANT
BLADE MINI RND TIP GREEN BEAV (BLADE) IMPLANT
BLADE SURG 10 STRL SS (BLADE) ×2 IMPLANT
BLADE SURG 15 STRL LF DISP TIS (BLADE) ×2 IMPLANT
BLADE SURG 15 STRL SS (BLADE) ×2
BNDG ELASTIC 2 VLCR STRL LF (GAUZE/BANDAGES/DRESSINGS) ×2 IMPLANT
BNDG ESMARK 4X9 LF (GAUZE/BANDAGES/DRESSINGS) ×2 IMPLANT
CHLORAPREP W/TINT 26ML (MISCELLANEOUS) ×2 IMPLANT
CLOTH BEACON ORANGE TIMEOUT ST (SAFETY) ×2 IMPLANT
CORDS BIPOLAR (ELECTRODE) ×2 IMPLANT
COVER MAYO STAND STRL (DRAPES) ×2 IMPLANT
COVER TABLE BACK 60X90 (DRAPES) ×2 IMPLANT
DRAPE EXTREMITY T 121X128X90 (DRAPE) ×2 IMPLANT
DRAPE OEC MINIVIEW 54X84 (DRAPES) ×2 IMPLANT
DRAPE SURG 17X23 STRL (DRAPES) ×2 IMPLANT
DRAPE UTILITY W/TAPE 26X15 (DRAPES) ×2 IMPLANT
DRILL 2.0 LNG QUICK RELEASE (BIT) ×2
DRILL 2.8 QUICK RELEASE (BIT) ×2
GAUZE XEROFORM 1X8 LF (GAUZE/BANDAGES/DRESSINGS) ×2 IMPLANT
GLOVE BIO SURGEON STRL SZ 6.5 (GLOVE) ×4 IMPLANT
GLOVE BIO SURGEON STRL SZ7.5 (GLOVE) ×2 IMPLANT
GLOVE BIOGEL PI IND STRL 7.0 (GLOVE) ×1 IMPLANT
GLOVE BIOGEL PI IND STRL 8 (GLOVE) ×1 IMPLANT
GLOVE BIOGEL PI IND STRL 8.5 (GLOVE) IMPLANT
GLOVE BIOGEL PI INDICATOR 7.0 (GLOVE) ×1
GLOVE BIOGEL PI INDICATOR 8 (GLOVE) ×1
GLOVE BIOGEL PI INDICATOR 8.5 (GLOVE)
GLOVE SURG ORTHO 8.0 STRL STRW (GLOVE) IMPLANT
GOWN PREVENTION PLUS XLARGE (GOWN DISPOSABLE) ×2 IMPLANT
GOWN STRL REIN XL XLG (GOWN DISPOSABLE) ×2 IMPLANT
GUIDEWIRE ORTHO 0.054X6 (WIRE) ×6 IMPLANT
NEEDLE HYPO 22GX1.5 SAFETY (NEEDLE) IMPLANT
NEEDLE HYPO 25X1 1.5 SAFETY (NEEDLE) IMPLANT
NS IRRIG 1000ML POUR BTL (IV SOLUTION) ×2 IMPLANT
PACK BASIN DAY SURGERY FS (CUSTOM PROCEDURE TRAY) ×2 IMPLANT
PAD CAST 3X4 CTTN HI CHSV (CAST SUPPLIES) ×1 IMPLANT
PAD CAST 4YDX4 CTTN HI CHSV (CAST SUPPLIES) ×1 IMPLANT
PADDING CAST ABS 4INX4YD NS (CAST SUPPLIES) ×1
PADDING CAST ABS COTTON 4X4 ST (CAST SUPPLIES) ×1 IMPLANT
PADDING CAST COTTON 3X4 STRL (CAST SUPPLIES) ×1
PADDING CAST COTTON 4X4 STRL (CAST SUPPLIES) ×1
PLATE WIDE RIGHT DISTAL (Plate) ×2 IMPLANT
SCREW CORT 3.5X14 (Screw) ×4 IMPLANT
SCREW CORT 3.5X16 (Screw) ×4 IMPLANT
SCREW CORT 3.5X18 CO (Screw) ×2 IMPLANT
SCREW CORT FT 18X2.3XLCK HEX (Screw) ×2 IMPLANT
SCREW CORTICAL LOCKING 2.3X18M (Screw) ×2 IMPLANT
SCREW CORTICAL LOCKING 2.3X22M (Screw) ×2 IMPLANT
SCREW CORTICAL LOCKING 2.3X24M (Screw) ×2 IMPLANT
SCREW FX22X2.3XLCK SMTH NS CRT (Screw) ×2 IMPLANT
SCREW FX24X2.3XLCK SMTH NS CRT (Screw) ×2 IMPLANT
SCREW SMOOTH PEGGED 2.3X26MM (Screw) ×2 IMPLANT
SLEEVE SCD COMPRESS KNEE MED (MISCELLANEOUS) ×2 IMPLANT
SPLINT PLASTER CAST XFAST 3X15 (CAST SUPPLIES) ×12 IMPLANT
SPLINT PLASTER CAST XFAST 4X15 (CAST SUPPLIES) IMPLANT
SPLINT PLASTER XTRA FAST SET 4 (CAST SUPPLIES)
SPLINT PLASTER XTRA FASTSET 3X (CAST SUPPLIES) ×12
SPONGE GAUZE 4X4 12PLY (GAUZE/BANDAGES/DRESSINGS) ×2 IMPLANT
STOCKINETTE 4X48 STRL (DRAPES) ×2 IMPLANT
SUCTION FRAZIER TIP 10 FR DISP (SUCTIONS) ×2 IMPLANT
SUT ETHILON 3 0 PS 1 (SUTURE) IMPLANT
SUT ETHILON 4 0 PS 2 18 (SUTURE) ×4 IMPLANT
SUT VIC AB 3-0 PS1 18 (SUTURE)
SUT VIC AB 3-0 PS1 18XBRD (SUTURE) IMPLANT
SUT VICRYL 4-0 PS2 18IN ABS (SUTURE) ×2 IMPLANT
SYR BULB 3OZ (MISCELLANEOUS) ×2 IMPLANT
SYR CONTROL 10ML LL (SYRINGE) IMPLANT
TOWEL OR 17X24 6PK STRL BLUE (TOWEL DISPOSABLE) ×4 IMPLANT
TUBE CONNECTING 20X1/4 (TUBING) ×2 IMPLANT
UNDERPAD 30X30 INCONTINENT (UNDERPADS AND DIAPERS) ×2 IMPLANT
WATER STERILE IRR 1000ML POUR (IV SOLUTION) IMPLANT

## 2011-08-07 NOTE — H&P (Signed)
  Steven Greene is an 78 y.o. male.   Chief Complaint: right wrist fracture HPI: 78 yo rhd male fell from standing height 08/01/11.  Seen at [facility] where XR revealed distal radius fracture.  Followed up in office.  Reports no previous injuries to right wrist and no other injuries at this time.  Past Medical History  Diagnosis Date  . Tremors of nervous system   . COPD (chronic obstructive pulmonary disease)   . Asthma   . Arthritis   . Foot drop, left   . HOH (hard of hearing)   . Cancer     prostate cancer-treated medically    Past Surgical History  Procedure Date  . Ankle fracture surgery 2009    lt  . Joint replacement 2000    rt hip replacement  . Hemorrhoid surgery   . Eye surgery     both cataracts  . Ankle fracture surgery     rt    History reviewed. No pertinent family history. Social History:  reports that he has never smoked. He does not have any smokeless tobacco history on file. He reports that he does not drink alcohol or use illicit drugs.  Allergies: No Known Allergies  Medications Prior to Admission  Medication Sig Dispense Refill  . Fluticasone-Salmeterol (ADVAIR) 250-50 MCG/DOSE AEPB Inhale 1 puff into the lungs every 12 (twelve) hours as needed. For shortness of breath      . HYDROcodone-acetaminophen (NORCO) 5-325 MG per tablet Take 1 tablet by mouth every 4 (four) hours as needed for pain.  30 tablet  0  . meloxicam (MOBIC) 7.5 MG tablet Take 7.5 mg by mouth daily.      . propranolol (INDERAL) 20 MG tablet Take 20 mg by mouth daily.        No results found for this or any previous visit (from the past 48 hour(s)).  No results found.   A comprehensive review of systems was negative except for: Eyes: positive for cataracts and contacts/glasses Ears, nose, mouth, throat, and face: positive for hearing loss Respiratory: positive for asthma and cough Hematologic/lymphatic: positive for bleeding Neurological: positive for gait problems and  headaches  Blood pressure 107/68, pulse 67, temperature 98.1 F (36.7 C), temperature source Oral, resp. rate 20, height 5\' 10"  (1.778 m), weight 84.823 kg (187 lb), SpO2 95.00%.  General appearance: alert, cooperative and appears stated age Head: Normocephalic, without obvious abnormality, atraumatic Neck: supple, symmetrical, trachea midline Resp: clear to auscultation bilaterally Cardio: regular rate and rhythm GI: soft, non-tender; bowel sounds normal; no masses,  no organomegaly Extremities: light touch sensation and capillary refill intact all digits.  +epl/fpl/io. Pulses: 2+ and symmetric Skin: eccymosis Neurologic: Grossly normal Incision/Wound: Na  Assessment/Plan Right comminuted intraarticular distal radius fracture.  Discussed non operative and operative treatment options with patient and daughter.  They elected operative fixation.  Risks, benefits, and alternatives of surgery were discussed and the patient agrees with the plan of care.   Cailyn Houdek R 08/07/2011, 10:46 AM

## 2011-08-07 NOTE — Transfer of Care (Signed)
Immediate Anesthesia Transfer of Care Note  Patient: Steven Greene  Procedure(s) Performed: Procedure(s) (LRB): OPEN REDUCTION INTERNAL FIXATION (ORIF) DISTAL RADIAL FRACTURE (Right)  Patient Location: PACU  Anesthesia Type: General  Level of Consciousness: awake  Airway & Oxygen Therapy: Patient Spontanous Breathing and Patient connected to face mask oxygen  Post-op Assessment: Report given to PACU RN and Post -op Vital signs reviewed and stable  Post vital signs: Reviewed and stable  Complications: No apparent anesthesia complications

## 2011-08-07 NOTE — Anesthesia Procedure Notes (Addendum)
Anesthesia Regional Block:  Supraclavicular block  Pre-Anesthetic Checklist: ,, timeout performed, Correct Patient, Correct Site, Correct Laterality, Correct Procedure, Correct Position, site marked, Risks and benefits discussed,  Surgical consent,  Pre-op evaluation,  At surgeon's request and post-op pain management  Laterality: Right  Prep: chloraprep       Needles:   Needle Type: Other   (Arrow Echogenic)   Needle Length: 9cm  Needle Gauge: 21    Additional Needles:  Procedures: ultrasound guided Supraclavicular block Narrative:  Start time: 08/07/2011 9:47 AM End time: 08/07/2011 9:54 AM Injection made incrementally with aspirations every 5 mL.  Performed by: Personally  Anesthesiologist: C Frederick  Additional Notes: Ultrasound guidance used to: id relevant anatomy, confirm needle position, local anesthetic spread, avoidance of vascular puncture. Picture saved. No complications. Block performed personally by Janetta Hora. Gelene Mink, MD    Supraclavicular block Procedure Name: MAC Performed by: York Grice Pre-anesthesia Checklist: Patient identified, Patient being monitored, Emergency Drugs available, Timeout performed and Suction available Patient Re-evaluated:Patient Re-evaluated prior to inductionOxygen Delivery Method: Simple face mask

## 2011-08-07 NOTE — Discharge Instructions (Addendum)
[facility] Instructions °Hand Surgery ° °Wound Care: °Keep your hand elevated above the level of your heart.  Do not allow it to dangle by your side.  Keep the dressing dry and do not remove it unless your doctor advises you to do so.  He will usually change it at the time of your post-op visit.  Moving your fingers is advised to stimulate circulation but will depend on the site of your surgery.  If you have a splint applied, your doctor will advise you regarding movement. ° °Activity: °Do not drive or operate machinery today.  Rest today and then you may return to your normal activity and work as indicated by your physician. ° °Diet:  °Drink liquids today or eat a light diet.  You may resume a regular diet tomorrow.   ° °General expectations: °Pain for two to three days. °Fingers may become slightly swollen. ° °Call your doctor if any of the following occur: °Severe pain not relieved by pain medication. °Elevated temperature. °Dressing soaked with blood. °Inability to move fingers. °White or bluish color to fingers. ° ° °Regional Anesthesia Blocks ° °1. Numbness or the inability to move the "blocked" extremity may last from 3-48 hours after placement. The length of time depends on the medication injected and your individual response to the medication. If the numbness is not going away after 48 hours, call your surgeon. ° °2. The extremity that is blocked will need to be protected until the numbness is gone and the  Strength has returned. Because you cannot feel it, you will need to take extra care to avoid injury. Because it may be weak, you may have difficulty moving it or using it. You may not know what position it is in without looking at it while the block is in effect. ° °3. For blocks in the legs and feet, returning to weight bearing and walking needs to be done carefully. You will need to wait until the numbness is entirely gone and the strength has returned. You should be able to move your leg and foot  normally before you try and bear weight or walk. You will need someone to be with you when you first try to ensure you do not fall and possibly risk injury. ° °4. Bruising and tenderness at the needle site are common side effects and will resolve in a few days. ° °5. Persistent numbness or new problems with movement should be communicated to the surgeon or the [facility] ([phone])/ [facility] ([phone]). ° ° °Post Anesthesia Home Care Instructions ° °Activity: °Get plenty of rest for the remainder of the day. A responsible adult should stay with you for 24 hours following the procedure.  °For the next 24 hours, DO NOT: °-Drive a car °-Operate machinery °-Drink alcoholic beverages °-Take any medication unless instructed by your physician °-Make any legal decisions or sign important papers. ° °Meals: °Start with liquid foods such as gelatin or soup. Progress to regular foods as tolerated. Avoid greasy, spicy, heavy foods. If nausea and/or vomiting occur, drink only clear liquids until the nausea and/or vomiting subsides. Call your physician if vomiting continues. ° °Special Instructions/Symptoms: °Your throat may feel dry or sore from the anesthesia or the breathing tube placed in your throat during surgery. If this causes discomfort, gargle with warm salt water. The discomfort should disappear within 24 hours. ° °

## 2011-08-07 NOTE — Op Note (Signed)
Dictation [phone]

## 2011-08-07 NOTE — Anesthesia Postprocedure Evaluation (Signed)
Anesthesia Post Note  Patient: Steven Greene  Procedure(s) Performed: Procedure(s) (LRB): OPEN REDUCTION INTERNAL FIXATION (ORIF) DISTAL RADIAL FRACTURE (Right)  Anesthesia type: MAC  Patient location: PACU  Post pain: Pain level controlled  Post assessment: Patient's Cardiovascular Status Stable  Last Vitals:  Filed Vitals:   08/07/11 1315  BP: 141/74  Pulse: 82  Temp:   Resp: 20    Post vital signs: Reviewed and stable  Level of consciousness: alert  Complications: No apparent anesthesia complications

## 2011-08-07 NOTE — Anesthesia Preprocedure Evaluation (Signed)
Anesthesia Evaluation  Patient identified by MRN, date of birth, ID band Patient awake    Reviewed: Allergy & Precautions, H&P , NPO status , Patient's Chart, lab work & pertinent test results, reviewed documented beta blocker date and time   Airway Mallampati: II TM Distance: >3 FB Neck ROM: full    Dental   Pulmonary asthma , COPD         Cardiovascular negative cardio ROS      Neuro/Psych  Neuromuscular disease negative psych ROS   GI/Hepatic negative GI ROS, Neg liver ROS,   Endo/Other  negative endocrine ROS  Renal/GU negative Renal ROS  negative genitourinary   Musculoskeletal   Abdominal   Peds  Hematology negative hematology ROS (+)   Anesthesia Other Findings See surgeon's H&P   Reproductive/Obstetrics negative OB ROS                           Anesthesia Physical Anesthesia Plan  ASA: III  Anesthesia Plan: MAC and Regional   Post-op Pain Management:    Induction:   Airway Management Planned: Simple Face Mask  Additional Equipment:   Intra-op Plan:   Post-operative Plan:   Informed Consent: I have reviewed the patients History and Physical, chart, labs and discussed the procedure including the risks, benefits and alternatives for the proposed anesthesia with the patient or authorized representative who has indicated his/her understanding and acceptance.   Dental Advisory Given  Plan Discussed with: CRNA and Surgeon  Anesthesia Plan Comments:         Anesthesia Quick Evaluation

## 2011-08-07 NOTE — Progress Notes (Signed)
Assisted Dr. Frederick with right, ultrasound guided, supraclavicular block. Side rails up, monitors on throughout procedure. See vital signs in flow sheet. Tolerated Procedure well. 

## 2011-08-08 NOTE — Op Note (Signed)
NAME:  Steven Greene, Steven Greene NO.:  [id number]  MEDICAL RECORD NO.:  [id number]  LOCATION:                                 FACILITY:  PHYSICIAN:  Betha Loa, MD        DATE OF BIRTH:  01-Aug-1919  DATE OF PROCEDURE:  08/07/2011 DATE OF DISCHARGE:                              OPERATIVE REPORT   PREOPERATIVE DIAGNOSIS:  Right comminuted intra-articular distal radius fracture.  POSTOPERATIVE DIAGNOSIS:  Right comminuted intra-articular distal radius fracture.  PROCEDURE:  Open reduction and internal fixation of right comminuted intra-articular distal radius fracture.  SURGEON:  Betha Loa, MD  ASSISTANT:  None.  ANESTHESIA:  General regional with sedation.  IV FLUIDS:  Per anesthesia flow sheet.  ESTIMATED BLOOD LOSS:  Minimal.  COMPLICATIONS:  None.  SPECIMENS:  None.  TOURNIQUET TIME:  70 minutes.  DISPOSITION:  Stable to PACU.  INDICATIONS:  Mr. Sturges is a 78 year old right-hand dominant male who fell 6 days ago onto his right wrist from standing height.  He presented to the [facility] Emergency Department where radiographs were taken revealing a comminuted intra-articular distal radius fracture.  He was splinted and followed up with me in the office.  On evaluation, he had intact sensation, capillary refill in all fingertips.  He could flex and extend the IP joint of his thumb and cross his fingers.  I discussed at length with Mr. Mcneal and his daughter the nature of his injury.  We discussed nonoperative and operative treatment measures including immobilization versus open reduction and internal fixation.  Risks, benefits, and alternatives of surgery were discussed including the risk of blood loss; infection; damage to nerves, vessels, tendons, ligaments, bone; failure of surgery; need for additional surgery; complications with wound healing; continued pain; nonunion; malunion; stiffness.  He voiced understanding of these risks and elected to  proceed.  OPERATIVE COURSE:  After being identified preoperatively by myself, the patient and I agreed upon the procedure and site of procedure.  The surgical site was marked.  The risks, benefits, and alternatives of surgery were reviewed and he wished to proceed.  Surgical consent had been signed.  He was given 2 g of IV Ancef as preoperative antibiotic prophylaxis.  A regional block was performed by Anesthesia in preoperative holding.  He was transferred to the operating room and placed on the operating table in supine position with the right upper extremity on an armboard.  Sedation was induced by the anesthesiologist. Right upper extremity was prepped and draped in normal sterile orthopedic fashion.  A surgical pause was performed between the surgeons, anesthesia, and operating room staff, and all were in agreement as to the patient, procedure, and site of procedure.  The tourniquet at the proximal aspect of the extremity was inflated to 250 mmHg after exsanguination of the limb with an Esmarch bandage.  Standard volar Sherilyn Cooter approach was used.  The superficial and deep portions of the FCR tendon sheath were incised.  Bipolar electrocautery was used throughout the case to obtain hemostasis.  The FCR and FPL were retracted ulnarly to protect the palmar cutaneous branch of the median nerve.  The pronator quadratus was  released from the radial side of the radius and elevated.  The brachioradialis was released.  The fracture site was easily identified.  It was somewhat impacted.  It was cleared of soft tissue interposition.  It was brought back out to length.  A wide plate from the [employer] volar distal radial locking set was selected. This was placed onto the radius and secured using the guidepins.  C-arm was used in AP, lateral, and oblique projections to ensure appropriate reduction and position of hardware, which was the case.  The distal holes in the plate were filled with all locking  pegs with the exception of the two styloid screw holes, which were filled with locking screws. The screw had been placed in the slotted hole in the shaft of the plate prior to placing the distal screw holes.  Standard AO drilling measuring technique was used throughout the case.  The remaining holes in the shaft of the plate were then filled again using standard AO drilling measuring technique.  C-arm was used in AP, lateral, and oblique projections to ensure appropriate reduction and position of hardware, which was the case.  There was no intra-articular penetration.  All screws had good purchase.  The wound was copiously irrigated with sterile saline.  The pronator quadratus was repaired back over top of the plate using 4-0 Vicryl suture.  Two inverted interrupted Vicryl sutures were placed in the subcutaneous tissues and the skin was closed with 4-0 nylon in a horizontal mattress fashion.  The wound was dressed with sterile Xeroform, 4x4s, and wrapped with a Kerlix bandage.  A volar splint was placed and wrapped with Kerlix and Ace bandage.  Tourniquet was deflated at 70 minutes.  Fingertips were pink with brisk capillary refill after deflation of the tourniquet.  The patient was awakened from sedation.  The operative drapes were broken down.  He has transferred back to the stretcher and taken to PACU in stable condition.  I will see him back in the office in 1 week for postoperative followup.  I will give him Norco 5/325 1-2 p.o. q.6 hours p.r.n. pain, dispensed #40.     Betha Loa, MD     KK/MEDQ  D:  08/07/2011  T:  08/07/2011  Job:  [id number]

## 2011-09-17 ENCOUNTER — Ambulatory Visit: Payer: Self-pay | Admitting: Oncology

## 2011-09-18 LAB — PSA: PSA: 8.8 ng/mL — ABNORMAL HIGH (ref 0.0–4.0)

## 2011-09-22 ENCOUNTER — Ambulatory Visit: Payer: Self-pay | Admitting: Oncology

## 2012-04-05 ENCOUNTER — Ambulatory Visit: Payer: Self-pay | Admitting: Family Medicine

## 2012-07-08 ENCOUNTER — Ambulatory Visit: Payer: Self-pay | Admitting: Oncology

## 2012-07-09 ENCOUNTER — Ambulatory Visit: Payer: Self-pay | Admitting: Family Medicine

## 2012-07-09 LAB — COMPREHENSIVE METABOLIC PANEL
Bilirubin,Total: 0.5 mg/dL (ref 0.2–1.0)
Calcium, Total: 9.2 mg/dL (ref 8.5–10.1)
Chloride: 101 mmol/L (ref 98–107)
Osmolality: 272 (ref 275–301)
Potassium: 4.5 mmol/L (ref 3.5–5.1)
SGOT(AST): 26 U/L (ref 15–37)
Total Protein: 7.4 g/dL (ref 6.4–8.2)

## 2012-07-09 LAB — LIPASE, BLOOD: Lipase: 78 U/L (ref 73–393)

## 2012-07-09 LAB — CBC WITH DIFFERENTIAL/PLATELET
Basophil %: 0.5 %
Eosinophil #: 0.1 10*3/uL (ref 0.0–0.7)
HCT: 38.5 % — ABNORMAL LOW (ref 40.0–52.0)
Lymphocyte %: 15.1 %
MCH: 31.4 pg (ref 26.0–34.0)
MCV: 94 fL (ref 80–100)
Neutrophil %: 76.1 %
Platelet: 152 10*3/uL (ref 150–440)
RBC: 4.11 10*6/uL — ABNORMAL LOW (ref 4.40–5.90)

## 2012-07-09 LAB — AMYLASE: Amylase: 53 U/L (ref 25–115)

## 2012-07-12 LAB — PSA: PSA: 14.1 ng/mL — ABNORMAL HIGH (ref 0.0–4.0)

## 2012-07-22 ENCOUNTER — Ambulatory Visit: Payer: Self-pay | Admitting: Oncology

## 2012-08-22 ENCOUNTER — Ambulatory Visit: Payer: Self-pay | Admitting: Oncology

## 2013-02-11 ENCOUNTER — Ambulatory Visit: Payer: Self-pay | Admitting: Family Medicine

## 2013-11-10 ENCOUNTER — Ambulatory Visit: Payer: Self-pay | Admitting: Family Medicine

## 2014-03-22 ENCOUNTER — Inpatient Hospital Stay: Payer: Self-pay | Admitting: Internal Medicine

## 2014-03-22 LAB — PROTIME-INR
INR: 0.8
Prothrombin Time: 11.5 secs (ref 11.5–14.7)

## 2014-03-22 LAB — CBC
HCT: 34 % — AB (ref 40.0–52.0)
HGB: 11 g/dL — ABNORMAL LOW (ref 13.0–18.0)
MCH: 31.3 pg (ref 26.0–34.0)
MCHC: 32.5 g/dL (ref 32.0–36.0)
MCV: 96 fL (ref 80–100)
Platelet: 160 10*3/uL (ref 150–440)
RBC: 3.53 10*6/uL — ABNORMAL LOW (ref 4.40–5.90)
RDW: 14.3 % (ref 11.5–14.5)
WBC: 6.6 10*3/uL (ref 3.8–10.6)

## 2014-03-22 LAB — COMPREHENSIVE METABOLIC PANEL
ALBUMIN: 3.3 g/dL — AB (ref 3.4–5.0)
ALK PHOS: 80 U/L
ALT: 21 U/L
AST: 32 U/L (ref 15–37)
Anion Gap: 5 — ABNORMAL LOW (ref 7–16)
BUN: 19 mg/dL — AB (ref 7–18)
Bilirubin,Total: 0.3 mg/dL (ref 0.2–1.0)
CALCIUM: 8.8 mg/dL (ref 8.5–10.1)
CHLORIDE: 105 mmol/L (ref 98–107)
Co2: 28 mmol/L (ref 21–32)
Creatinine: 0.9 mg/dL (ref 0.60–1.30)
EGFR (Non-African Amer.): >60
GLUCOSE: 107 mg/dL — AB (ref 65–99)
OSMOLALITY: 278 (ref 275–301)
Potassium: 4.6 mmol/L (ref 3.5–5.1)
Sodium: 138 mmol/L (ref 136–145)
TOTAL PROTEIN: 6.6 g/dL (ref 6.4–8.2)

## 2014-03-23 LAB — CBC WITH DIFFERENTIAL/PLATELET
Basophil #: 0 10*3/uL (ref 0.0–0.1)
Basophil %: 1.4 %
EOS ABS: 0.4 10*3/uL (ref 0.0–0.7)
Eosinophil %: 10.4 %
HCT: 25.8 % — ABNORMAL LOW (ref 40.0–52.0)
HGB: 8.4 g/dL — ABNORMAL LOW (ref 13.0–18.0)
LYMPHS ABS: 1.2 10*3/uL (ref 1.0–3.6)
LYMPHS PCT: 34.4 %
MCH: 31.1 pg (ref 26.0–34.0)
MCHC: 32.8 g/dL (ref 32.0–36.0)
MCV: 95 fL (ref 80–100)
MONO ABS: 0.4 x10 3/mm (ref 0.2–1.0)
Monocyte %: 11.7 %
NEUTROS ABS: 1.5 10*3/uL (ref 1.4–6.5)
NEUTROS PCT: 42.1 %
Platelet: 114 10*3/uL — ABNORMAL LOW (ref 150–440)
RBC: 2.72 10*6/uL — ABNORMAL LOW (ref 4.40–5.90)
RDW: 14.2 % (ref 11.5–14.5)
WBC: 3.6 10*3/uL — ABNORMAL LOW (ref 3.8–10.6)

## 2014-03-23 LAB — BASIC METABOLIC PANEL
Anion Gap: 1 — ABNORMAL LOW (ref 7–16)
BUN: 16 mg/dL (ref 7–18)
CALCIUM: 8.4 mg/dL — AB (ref 8.5–10.1)
CHLORIDE: 109 mmol/L — AB (ref 98–107)
CREATININE: 0.98 mg/dL (ref 0.60–1.30)
Co2: 31 mmol/L (ref 21–32)
EGFR (African American): >60
EGFR (Non-African Amer.): >60
Glucose: 102 mg/dL — ABNORMAL HIGH (ref 65–99)
Osmolality: 283 (ref 275–301)
Potassium: 4.2 mmol/L (ref 3.5–5.1)
SODIUM: 141 mmol/L (ref 136–145)

## 2014-03-23 LAB — HEMOGLOBIN: HGB: 8.2 g/dL — ABNORMAL LOW (ref 13.0–18.0)

## 2014-03-24 DIAGNOSIS — K625 Hemorrhage of anus and rectum: Secondary | ICD-10-CM | POA: Diagnosis not present

## 2014-03-24 DIAGNOSIS — K5730 Diverticulosis of large intestine without perforation or abscess without bleeding: Secondary | ICD-10-CM | POA: Diagnosis not present

## 2014-03-24 DIAGNOSIS — K922 Gastrointestinal hemorrhage, unspecified: Secondary | ICD-10-CM | POA: Diagnosis not present

## 2014-03-24 DIAGNOSIS — J45909 Unspecified asthma, uncomplicated: Secondary | ICD-10-CM | POA: Diagnosis not present

## 2014-03-24 DIAGNOSIS — K5732 Diverticulitis of large intestine without perforation or abscess without bleeding: Secondary | ICD-10-CM | POA: Diagnosis not present

## 2014-03-24 DIAGNOSIS — R251 Tremor, unspecified: Secondary | ICD-10-CM | POA: Diagnosis not present

## 2014-03-24 DIAGNOSIS — K259 Gastric ulcer, unspecified as acute or chronic, without hemorrhage or perforation: Secondary | ICD-10-CM | POA: Diagnosis not present

## 2014-03-24 DIAGNOSIS — Z791 Long term (current) use of non-steroidal anti-inflammatories (NSAID): Secondary | ICD-10-CM | POA: Diagnosis not present

## 2014-03-24 DIAGNOSIS — K921 Melena: Secondary | ICD-10-CM | POA: Diagnosis not present

## 2014-03-24 LAB — CBC WITH DIFFERENTIAL/PLATELET
Basophil #: 0 10*3/uL (ref 0.0–0.1)
Basophil #: 0 10*3/uL (ref 0.0–0.1)
Basophil %: 0.8 %
Basophil %: 0.9 %
EOS PCT: 8.9 %
Eosinophil #: 0.3 10*3/uL (ref 0.0–0.7)
Eosinophil #: 0.3 10*3/uL (ref 0.0–0.7)
Eosinophil %: 8.2 %
HCT: 22.9 % — AB (ref 40.0–52.0)
HCT: 25.3 % — AB (ref 40.0–52.0)
HGB: 7.4 g/dL — AB (ref 13.0–18.0)
HGB: 8.1 g/dL — AB (ref 13.0–18.0)
LYMPHS PCT: 30.3 %
LYMPHS PCT: 25.6 %
Lymphocyte #: 1 10*3/uL (ref 1.0–3.6)
Lymphocyte #: 0.9 10*3/uL — ABNORMAL LOW (ref 1.0–3.6)
MCH: 30.9 pg (ref 26.0–34.0)
MCH: 31.1 pg (ref 26.0–34.0)
MCHC: 32.3 g/dL (ref 32.0–36.0)
MCHC: 32.2 g/dL (ref 32.0–36.0)
MCV: 96 fL (ref 80–100)
MCV: 97 fL (ref 80–100)
MONO ABS: 0.3 x10 3/mm (ref 0.2–1.0)
MONOS PCT: 9.1 %
Monocyte #: 0.3 x10 3/mm (ref 0.2–1.0)
Monocyte %: 10 %
Neutrophil #: 1.6 10*3/uL (ref 1.4–6.5)
Neutrophil #: 2 10*3/uL (ref 1.4–6.5)
Neutrophil %: 50 %
Neutrophil %: 56.2 %
Platelet: 112 10*3/uL — ABNORMAL LOW (ref 150–440)
Platelet: 121 10*3/uL — ABNORMAL LOW (ref 150–440)
RBC: 2.4 10*6/uL — ABNORMAL LOW (ref 4.40–5.90)
RBC: 2.62 10*6/uL — AB (ref 4.40–5.90)
RDW: 14.2 % (ref 11.5–14.5)
RDW: 14.5 % (ref 11.5–14.5)
WBC: 3.3 10*3/uL — AB (ref 3.8–10.6)
WBC: 3.5 10*3/uL — AB (ref 3.8–10.6)

## 2014-03-24 LAB — BASIC METABOLIC PANEL
Anion Gap: 3 — ABNORMAL LOW (ref 7–16)
BUN: 11 mg/dL (ref 7–18)
CALCIUM: 7.8 mg/dL — AB (ref 8.5–10.1)
Chloride: 111 mmol/L — ABNORMAL HIGH (ref 98–107)
Co2: 28 mmol/L (ref 21–32)
Creatinine: 0.97 mg/dL (ref 0.60–1.30)
GLUCOSE: 97 mg/dL (ref 65–99)
OSMOLALITY: 282 (ref 275–301)
POTASSIUM: 3.8 mmol/L (ref 3.5–5.1)
SODIUM: 142 mmol/L (ref 136–145)

## 2014-03-25 DIAGNOSIS — K5730 Diverticulosis of large intestine without perforation or abscess without bleeding: Secondary | ICD-10-CM | POA: Diagnosis not present

## 2014-03-25 DIAGNOSIS — J45909 Unspecified asthma, uncomplicated: Secondary | ICD-10-CM | POA: Diagnosis not present

## 2014-03-25 DIAGNOSIS — R251 Tremor, unspecified: Secondary | ICD-10-CM | POA: Diagnosis not present

## 2014-03-25 DIAGNOSIS — K922 Gastrointestinal hemorrhage, unspecified: Secondary | ICD-10-CM | POA: Diagnosis not present

## 2014-03-25 LAB — BASIC METABOLIC PANEL
Anion Gap: 5 — ABNORMAL LOW (ref 7–16)
BUN: 8 mg/dL (ref 7–18)
CREATININE: 0.92 mg/dL (ref 0.60–1.30)
Calcium, Total: 8 mg/dL — ABNORMAL LOW (ref 8.5–10.1)
Chloride: 111 mmol/L — ABNORMAL HIGH (ref 98–107)
Co2: 28 mmol/L (ref 21–32)
EGFR (African American): >60
GLUCOSE: 96 mg/dL (ref 65–99)
Osmolality: 285 (ref 275–301)
POTASSIUM: 3.6 mmol/L (ref 3.5–5.1)
SODIUM: 144 mmol/L (ref 136–145)

## 2014-03-25 LAB — CBC WITH DIFFERENTIAL/PLATELET
BASOS ABS: 0 10*3/uL (ref 0.0–0.1)
Basophil %: 1 %
Eosinophil #: 0.3 10*3/uL (ref 0.0–0.7)
Eosinophil %: 8.4 %
HCT: 26 % — AB (ref 40.0–52.0)
HGB: 8.5 g/dL — ABNORMAL LOW (ref 13.0–18.0)
LYMPHS ABS: 1 10*3/uL (ref 1.0–3.6)
Lymphocyte %: 26.3 %
MCH: 30.8 pg (ref 26.0–34.0)
MCHC: 32.8 g/dL (ref 32.0–36.0)
MCV: 94 fL (ref 80–100)
MONO ABS: 0.5 x10 3/mm (ref 0.2–1.0)
MONOS PCT: 12 %
NEUTROS ABS: 2.1 10*3/uL (ref 1.4–6.5)
Neutrophil %: 52.3 %
PLATELETS: 118 10*3/uL — AB (ref 150–440)
RBC: 2.76 10*6/uL — ABNORMAL LOW (ref 4.40–5.90)
RDW: 14.7 % — AB (ref 11.5–14.5)
WBC: 4 10*3/uL (ref 3.8–10.6)

## 2014-03-26 DIAGNOSIS — R251 Tremor, unspecified: Secondary | ICD-10-CM | POA: Diagnosis not present

## 2014-03-26 DIAGNOSIS — K922 Gastrointestinal hemorrhage, unspecified: Secondary | ICD-10-CM | POA: Diagnosis not present

## 2014-03-26 DIAGNOSIS — J45909 Unspecified asthma, uncomplicated: Secondary | ICD-10-CM | POA: Diagnosis not present

## 2014-03-26 LAB — CBC WITH DIFFERENTIAL/PLATELET
Basophil #: 0 10*3/uL (ref 0.0–0.1)
Basophil %: 0.7 %
EOS ABS: 0.4 10*3/uL (ref 0.0–0.7)
Eosinophil %: 8.6 %
HCT: 25.2 % — ABNORMAL LOW (ref 40.0–52.0)
HGB: 8.1 g/dL — ABNORMAL LOW (ref 13.0–18.0)
Lymphocyte #: 1 10*3/uL (ref 1.0–3.6)
Lymphocyte %: 21.8 %
MCH: 30.4 pg (ref 26.0–34.0)
MCHC: 32.3 g/dL (ref 32.0–36.0)
MCV: 94 fL (ref 80–100)
MONOS PCT: 10.4 %
Monocyte #: 0.5 x10 3/mm (ref 0.2–1.0)
Neutrophil #: 2.6 10*3/uL (ref 1.4–6.5)
Neutrophil %: 58.5 %
PLATELETS: 126 10*3/uL — AB (ref 150–440)
RBC: 2.67 10*6/uL — ABNORMAL LOW (ref 4.40–5.90)
RDW: 14.8 % — AB (ref 11.5–14.5)
WBC: 4.4 10*3/uL (ref 3.8–10.6)

## 2014-03-26 LAB — BASIC METABOLIC PANEL
ANION GAP: 7 (ref 7–16)
BUN: 9 mg/dL (ref 7–18)
CALCIUM: 7.9 mg/dL — AB (ref 8.5–10.1)
Chloride: 110 mmol/L — ABNORMAL HIGH (ref 98–107)
Co2: 28 mmol/L (ref 21–32)
Creatinine: 0.95 mg/dL (ref 0.60–1.30)
GLUCOSE: 99 mg/dL (ref 65–99)
OSMOLALITY: 287 (ref 275–301)
Potassium: 3.5 mmol/L (ref 3.5–5.1)
Sodium: 145 mmol/L (ref 136–145)

## 2014-03-29 DIAGNOSIS — K5790 Diverticulosis of intestine, part unspecified, without perforation or abscess without bleeding: Secondary | ICD-10-CM | POA: Diagnosis not present

## 2014-03-29 DIAGNOSIS — K625 Hemorrhage of anus and rectum: Secondary | ICD-10-CM | POA: Diagnosis not present

## 2014-03-30 DIAGNOSIS — K625 Hemorrhage of anus and rectum: Secondary | ICD-10-CM | POA: Diagnosis not present

## 2014-03-30 DIAGNOSIS — K2900 Acute gastritis without bleeding: Secondary | ICD-10-CM | POA: Diagnosis not present

## 2014-04-20 DIAGNOSIS — K5790 Diverticulosis of intestine, part unspecified, without perforation or abscess without bleeding: Secondary | ICD-10-CM | POA: Diagnosis not present

## 2014-04-20 DIAGNOSIS — R58 Hemorrhage, not elsewhere classified: Secondary | ICD-10-CM | POA: Diagnosis not present

## 2014-04-20 DIAGNOSIS — K625 Hemorrhage of anus and rectum: Secondary | ICD-10-CM | POA: Diagnosis not present

## 2014-04-20 DIAGNOSIS — R251 Tremor, unspecified: Secondary | ICD-10-CM | POA: Diagnosis not present

## 2014-04-20 DIAGNOSIS — D649 Anemia, unspecified: Secondary | ICD-10-CM | POA: Diagnosis not present

## 2014-05-18 DIAGNOSIS — K625 Hemorrhage of anus and rectum: Secondary | ICD-10-CM | POA: Diagnosis not present

## 2014-05-18 DIAGNOSIS — R251 Tremor, unspecified: Secondary | ICD-10-CM | POA: Diagnosis not present

## 2014-05-18 DIAGNOSIS — K2210 Ulcer of esophagus without bleeding: Secondary | ICD-10-CM | POA: Diagnosis not present

## 2014-05-18 DIAGNOSIS — M1990 Unspecified osteoarthritis, unspecified site: Secondary | ICD-10-CM | POA: Diagnosis not present

## 2014-05-18 DIAGNOSIS — R109 Unspecified abdominal pain: Secondary | ICD-10-CM | POA: Diagnosis not present

## 2014-07-15 NOTE — Consult Note (Signed)
Chief Complaint:  Subjective/Chief Complaint seen for GI bleeding, reported black stool.  Paitent on meloxicam at home and occasional asa.  No n or v.  However, on rectal exam effluent is maroon bloody/mucousy.  Possibility that the bleeding source is lower rather than upper. I recommend EGD for initial evaluation to assess/rule out upper source, and if negative will do ct abd and pelvis to evaluate for mass, with colonoscopy possibly to follow.  DDX includes upper source, diverticular lower source, other.  Of note there is a mass like effect in the suprapubic to right of midline fullness which can be elicudated with the imaging.   I have discussed the risks beneftis and complications of egd to include not limited to bleeding infection perforationa nd sedation and he wishes to proceed.   Electronic Signatures for Addendum Section:  Loistine Simas (MD) (Signed Addendum 31-Dec-15 18:42)  please see full GI consult [phone]   Electronic Signatures: Loistine Simas (MD)  (Signed 31-Dec-15 11:37)  Authored: Chief Complaint   Last Updated: 31-Dec-15 18:42 by Loistine Simas (MD)

## 2014-07-17 ENCOUNTER — Ambulatory Visit
Admit: 2014-07-17 | Disposition: A | Discharge status: Home / Self Care | Payer: Self-pay | Attending: Family Medicine | Admitting: Family Medicine

## 2014-07-17 DIAGNOSIS — M47816 Spondylosis without myelopathy or radiculopathy, lumbar region: Secondary | ICD-10-CM | POA: Diagnosis not present

## 2014-07-17 DIAGNOSIS — R109 Unspecified abdominal pain: Secondary | ICD-10-CM | POA: Diagnosis not present

## 2014-07-17 DIAGNOSIS — R100 Acute abdomen: Secondary | ICD-10-CM | POA: Diagnosis not present

## 2014-07-17 DIAGNOSIS — M545 Low back pain: Secondary | ICD-10-CM | POA: Diagnosis not present

## 2014-07-17 DIAGNOSIS — C61 Malignant neoplasm of prostate: Secondary | ICD-10-CM | POA: Diagnosis not present

## 2014-07-19 NOTE — H&P (Signed)
PATIENT NAME:  Steven Greene, Steven Greene MR#:  [id number] DATE OF BIRTH:  1920-02-04  DATE OF ADMISSION:  03/22/2014  PRIMARY CARE PROVIDER: Dr. Dossie Arbour.     EMERGENCY DEPARTMENT REFERRING PHYSICIAN:  Dr. Lenard Lance.    CHIEF COMPLAINT: Dark-colored tarry stools.   HISTORY OF PRESENT ILLNESS: The patient is a 81 year old white male with history of prostate cancer in the past, history of resting tremor, history of drop foot, has osteoarthritis, who states that he started having dark-colored bloody stools since this Monday. Every time he went to the bathroom he continued to have that. He had these persistent symptoms, therefore came to the ED.  The patient in the ER was noted to have a slightly low hemoglobin. Hemodynamically stable. We are asked to admit. The ED physician's PA spoke to Dr. Marva Panda, the GI physician on call, who recommended an admission. The patient denies any abdominal pain. He takes meloxicam on a daily basis which is chronic for him. He does not take any additional NSAIDs. No history of GERD, in the past.   PAST MEDICAL HISTORY:  1.  History of prostate cancer in the past.  2.  History of apparently melanoma.  3.  History of osteoarthritis.  4.  Hemorrhoids.  5.  Resting tremor.   PAST SURGICAL HISTORY: Hip replacement, hemorrhoid surgery, history of melanoma resection.    ALLERGIES: None.   MEDICATIONS: Symbicort 2 puffs b.i.d., propranolol 20 one tab p.o. b.i.d., meloxicam 7.5 p.o. daily.   SOCIAL HISTORY: Does not smoke. Does not drink.   FAMILY HISTORY: Son with T-cell lymphoma, sibling with pancreatic cancer.   REVIEW OF SYSTEMS:     CONSTITUTIONAL: Denies any fevers, fatigue, weakness. No weight loss or weight gain.  EYES: No blurred or double vision. No redness. No inflammation.  EARS, NOSE, AND THROAT: Complains of some hearing loss. No sinus pain.  RESPIRATORY: Denies any cough, wheezing, hemoptysis. No COPD.   CARDIOVASCULAR: No chest pain, orthopnea. Complains of  chronic edema. No arrhythmia. No syncope.  GASTROINTESTINAL: No nausea, vomiting, diarrhea. No abdominal pain. No GERD. No IBS.  No jaundice.  GENITOURINARY: Denies any dysuria, hematuria, renal colic, or frequency.  ENDOCRINE: Denies any polyuria, nocturia, thyroid problems.  HEMATOLOGIC AND LYMPHATIC: Denies anemia, easy bleeding, or bruisability.  MUSCULOSKELETAL: Has osteoarthritis.  NEUROLOGIC: No CVA, TIA, seizure.  PSYCHIATRIC: No anxiety, insomnia, or ADD.   PHYSICAL EXAMINATION:  VITAL SIGNS: Temperature 97.9, pulse 89, respirations 18, blood pressure 156/85, O2 of 99%.  GENERAL: The patient is a well-developed, well-nourished male in no acute distress.  HEENT: Head atraumatic, normocephalic. Pupils equally round, reactive to light and accommodation. There is no conjunctival pallor. No sclerae icterus. Nasal exam shows no drainage or ulceration. Oropharynx is clear without any exudate.  NECK: Supple without any thyromegaly.  CARDIOVASCULAR: Regular rate and rhythm. No murmurs, rubs, clicks, or gallops.  LUNGS: Clear to auscultation bilaterally without any rales, rhonchi, wheezing.  ABDOMEN: Soft, nontender, nondistended. Positive bowel sounds x 4.  EXTREMITIES:  He has got 2 + edema.  SKIN: No rash.  LYMPH NODES: Nonpalpable.  MUSCULOSKELETAL: There is no erythema or swelling.  VASCULAR: Good DP and PT pulses.  PSYCHIATRIC: Not anxious or depressed.  NEUROLOGIC:  Awake, alert, oriented x 3.   LABORATORY DATA: Glucose 107, BUN 19, creatinine 0.90, sodium 138, potassium 4.6, chloride 105, CO2 of 28, calcium 8.8. LFTs are normal except albumin of 3.3. WBC 6.6, hemoglobin 11, platelet count 160,000.   ASSESSMENT AND PLAN: The patient is a  81 year old white male with history of mild asthma, resting tremor, presents with dark tarry stools since Monday.   1.  Hematochezia likely due to upper gastrointestinal bleed. At this time will place him on Protonix drip, follow H and H.  GI  consult. Transfuse as needed.  2.  Resting tremor. Continue propranolol.  3.  Mild asthma. Continue Symbicort as taking at home.  4.  Miscellaneous. The patient will be on SCDs for DVT prophylaxis.   TIME SPENT ON THIS PATIENT: 45 minutes.     ____________________________ Lacie Scotts Allena Katz, MD shp:bu D: 03/22/2014 19:24:41 ET T: 03/22/2014 19:34:42 ET JOB#: [id number]  cc: Istvan Behar H. Allena Katz, MD, <Dictator> Charise Carwin MD ELECTRONICALLY SIGNED 03/26/2014 12:24

## 2014-07-19 NOTE — Consult Note (Signed)
Chief Complaint:  Subjective/Chief Complaint seen for GI bleeding.  hemodynamically stable, minimal bleeding overnight/since yesterday pm.  no nausea or abdominal pain.   VITAL SIGNS/ANCILLARY NOTES: **Vital Signs.:   01-Jan-16 07:51  Vital Signs Type Q 8hr  Temperature Temperature (F) 97.8  Celsius 36.5  Pulse Pulse 78  Respirations Respirations 18  Systolic BP Systolic BP 93  Diastolic BP (mmHg) Diastolic BP (mmHg) 46  Mean BP 61  Pulse Ox % Pulse Ox % 90  Pulse Ox Activity Level  At rest  Oxygen Delivery Room Air/ 21 %   Brief Assessment:  Cardiac Regular   Respiratory clear BS   Gastrointestinal details normal Soft  Nontender  Nondistended  No masses palpable  Bowel sounds normal   Lab Results: Routine BB:  01-Jan-16 07:38   Crossmatch Unit 1 Ready (Result(s) reported on 24 Mar 2014 at 11:27AM.)  ABO Group + Rh Type A Positive  Antibody Screen NEGATIVE (Result(s) reported on 24 Mar 2014 at 12:20PM.)  Routine Chem:  01-Jan-16 07:38   Glucose, Serum 97  BUN 11  Creatinine (comp) 0.97  Sodium, Serum 142  Potassium, Serum 3.8  Chloride, Serum  111  CO2, Serum 28  Calcium (Total), Serum  7.8  Anion Gap  3  Osmolality (calc) 282  eGFR (African American) >60  eGFR (Non-African American) >60 (eGFR values <76m/min/1.73 m2 may be an indication of chronic kidney disease (CKD). Calculated eGFR, using the MRDR Study equation, is useful in  patients with stable renal function. The eGFR calculation will not be reliable in acutely ill patients when serum creatinine is changing rapidly. It is not useful in patients on dialysis. The eGFR calculation may not be applicable to patients at the low and high extremes of body sizes, pregnant women, and vegetarians.)  Routine Hem:  01-Jan-16 07:38   WBC (CBC)  3.3  RBC (CBC)  2.40  Hemoglobin (CBC)  7.4  Hematocrit (CBC)  22.9  Platelet Count (CBC)  112  MCV 96  MCH 30.9  MCHC 32.3  RDW 14.2  Neutrophil % 50.0   Lymphocyte % 30.3  Monocyte % 10.0  Eosinophil % 8.9  Basophil % 0.8  Neutrophil # 1.6  Lymphocyte # 1.0  Monocyte # 0.3  Eosinophil # 0.3  Basophil # 0.0 (Result(s) reported on 24 Mar 2014 at 08:00AM.)   Assessment/Plan:  Assessment/Plan:  Assessment 1) GI bleeding-EGD, though showing marked gastritis and multiple erosions/shallow ulcerations likely from meloxicam use,  negative for active bleeding or stigmata of recent bleeding yesterday.  Character of blood is consistant with diverticular bleeding. Improving, much less.  Some drift of hgb, possibly dilutional.   2) history of untreated/tx declined by patient prostate cancer.   Plan 1) agree with tfx, continue current.  Bleeding is improved, and if diverticular in nature should resolve.  would consider ct abd and pelvis for fu on lower right /suprapubic abdominal fullness.  Will need colonoscopy once current clinical situation resolved or sooner if continues blood with stool. Dr RRayann Hemanto be available tomorrow.   Electronic Signatures: SLoistine Simas(MD)  (Signed 01-Jan-16 12:54)  Authored: Chief Complaint, VITAL SIGNS/ANCILLARY NOTES, Brief Assessment, Lab Results, Assessment/Plan   Last Updated: 01-Jan-16 12:54 by SLoistine Simas(MD)

## 2014-07-19 NOTE — Consult Note (Signed)
PATIENT NAME:  Steven Greene, Steven Greene MR#:  [id number] DATE OF BIRTH:  March 21, 1920  DATE OF CONSULTATION:  03/23/2014  REFERRING PHYSICIAN:   CONSULTING PHYSICIAN:  Lollie Sails, MD  REASON FOR CONSULTATION: Melena.   HISTORY OF PRESENT ILLNESS: Mr. Labat is a very pleasant 81 year old Caucasian male who was in his usual state of health until this past Monday. At that time, he noted several stools that were black. He states that this seemed to get a little bit better on Tuesday, but then repeated on Wednesday. He came to the Emergency Room for further evaluation and was admitted in the evening. He denies any nausea or pain. There has been no emesis. He does have some occasional dysphagia, this occurring for well over 5 years. He does have some occasional heartburn, for which he uses TUMS to good effect. He has a bowel movement, usually every 1-2 days, no black stool, blood in the stools, or slimy stools, otherwise. He occasionally takes an aspirin for headaches. He takes a "yellow arthritis pill." In review, this is meloxicam 15 mg daily. He states he did have a colonoscopy about 20 years ago.   The patient has been hemodynamically stable. He has had several maroon-type stools overnight.   GASTROINTESTINAL FAMILY HISTORY: Pertinent for a brother with pancreatic cancer. There is no family history of colon cancer.   PAST MEDICAL HISTORY:  History of prostate cancer diagnosed within the past 2-3 years. He did not follow through with treatment.  1. He has a history of osteoarthritis. 2. History of hemorrhoids.  3. Resting tremor.   PAST SURGICAL HISTORY:   1. History of melanoma resection.  2. He has had a hip replacement. That was done well over 10 years ago.   ALLERGIES: There are no known drug allergies.   CURRENT MEDICATIONS: Includes meloxicam 7.5 mg once a day and propranolol 20 mg 1 tablet twice a day. He also uses Symbicort 160 mcg/4.5 mcg inhalation 2 puffs twice a day.   ALLERGIES: He has  no known drug allergies.   REVIEW OF SYSTEMS: Per admission history and physical. Agree with same.   PHYSICAL EXAMINATION: VITAL SIGNS: Temperature is 97.5, pulse 105, respirations 18, blood pressure 145/80, pulse oximetry 98.  GENERAL: He is a well-appearing 81 year old Caucasian male no acute distress.  HEENT: Normocephalic, atraumatic. Eyes are anicteric. Nose: Septum midline, no lesions. Oropharynx: No lesions.  NECK: Supple. No JVD.  HEART: Regular rate and rhythm.  LUNGS: Clear.  ABDOMEN: Soft, nontender, nondistended. Bowel sounds positive, normoactive.  ANORECTAL: Shows a watery, maroon effluent. This is not a black stool.  EXTREMITIES: No clubbing, cyanosis, or edema.  NEUROLOGICAL: Cranial nerves II through XII grossly intact. Muscle strength, bilaterally equal and symmetric.   LABORATORY DATA: Includes the following: On admission to the hospital, he had a BUN of 19, creatinine 0.90, sodium 138, potassium 4.6, chloride 105, bicarbonate 28, calcium 8.8. Hepatic profile was normal with the exception of a slightly low albumin at 3.3. Hemogram showed a white count of 6.6, H and H 11.0/34.0, platelet count 160. Repeat labs early this morning showed his a hemoglobin to have dropped to 8.4, white count 3.6, platelet count 114, MCV 95. His BUN had improved to 16. His INR was 0.8.   IMAGING STUDIES: There were no imaging studies.   ASSESSMENT: Gastrointestinal bleeding. There were reported black stools prior to admission, and the patient even refers to current stools, that have happened since admission, as being black; however, on rectal examination,  these or not black. These are more of a dark maroon, indicative of possibly a lower gastrointestinal bleed, rather than upper; however, the patient does have a history of taking meloxicam regularly. There is a possibility that the bleeding source is lower, rather than upper. I do recommend an EGD for initial evaluation to assess or rule out upper GI  source. If this were negative, would consider a CT scan of the abdomen and pelvis to evaluate for a mass, with colonoscopy possibly to follow. Differential diagnoses to include upper sources, diverticular lower source, other. Of note, there is a mass-like effect in the suprapubic region to the right of midline, fullness in nature, which can be elucidated further on imaging. It is of note that he does have a history of prostate cancer, which was not treated and has not been followed up. This was approximately a year to 2 years ago.   RECOMMENDATIONS: Continue IV PPI, as you are. I have discussed the risks, benefits, and complications of EGD to include but not limited to bleeding, infection, perforation, and the risk of sedation with both the patient and his daughter, and they wish to proceed.    ____________________________ Lollie Sails, MD mus:mw D: 03/23/2014 18:42:08 ET T: 03/23/2014 19:27:24 ET JOB#: [id number]  cc: Lollie Sails, MD, <Dictator> Lollie Sails MD ELECTRONICALLY SIGNED 04/04/2014 12:06

## 2014-07-23 NOTE — Discharge Summary (Signed)
PATIENT NAME:  Steven Greene, Steven Greene MR#:  [id number] DATE OF BIRTH:  [birth year]  DATE OF ADMISSION:  03/22/2014 DATE OF DISCHARGE:  03/26/2014  ADMITTING DIAGNOSES: 1. Lower gastrointestinal bleed, likely related to diverticular bleed as per gastrointestinal bleeding scan positive for uptake in the rectal area. The patient needs outpatient luminal evaluation. He will follow up with [facility].  2. Acute blood loss anemia, status post transfusion of 1 unit.  3. Some erosive gastritis noted on EGD.  4. Essential tremor,  5. History of prostate cancer in the past.  6. History of melanoma in the past.  7. Osteoarthritis.  8. Status post hemorrhoidectomy.  9. Status post hip replacement.   CONSULTANTS: [facility].  PERTINENT LABORATORIES AND EVALUATIONS: Admitting glucose 107, BUN 19, creatinine 0.9, sodium 138, potassium 4.6, chloride 105, CO2 of 28, calcium 8.8. LFTs were normal except albumin of 3.3. WBC 6.6, hemoglobin 11. It dropped to 7.5 on January 1. INR 0.8. Nuclear blood tag scan positive for uptake in the rectal area.   HOSPITAL COURSE: Please refer to H and P done by me on admission. The patient is a 81 year old white male with history of previous prostate cancer. Her presented with complaint of dark-colored, tarry stools but he also had bright red blood per rectum. It was unclear whether it was an upper or lower GI bleed. He was admitted to the hospital with serial observation of his hemoglobin. The patient did receive 1 unit of packed RBCs. He was seen in consultation by GI throughout the hospitalization. The patient continued to have episodes of bleeding. Therefore, he had an EGD which showed erosive gastritis. He also had a bleeding scan which was positive for in the rectal area. He was seen by Dr. Arther Dames who felt that it was likely diverticular in nature. Stated that if his hemoglobin was stable, the patient can be discharged home. They will consider outpatient sigmoidoscopy or  colonoscopy. At this time, the patient is stable. No further bleeding.   DISCHARGE MEDICATIONS: Propranolol 20 mg 1 tab p.o. b.i.d., Symbicort 2 puffs b.i.d., acetaminophen 650 mg q. 4 hours p.r.n., Protonix 40 mg 1 tab p.o. b.i.d.   DISCHARGE DIET: Low-sodium, low-fat, low-cholesterol.   ACTIVITY: As tolerated.   DISCHARGE FOLLOWUP: Follow up with Dr. Gustavo Lah in 3 to 4 weeks. The patient should have a CBC checked with his primary care provider in the coming week.Marland Kitchen   TIME SPENT: 35 minutes on this discharge.     ____________________________ Lafonda Mosses. Posey Pronto, MD shp:jh D: 03/26/2014 12:16:54 ET T: 03/26/2014 17:04:19 ET JOB#: [id number]  cc: Abe Schools H. Posey Pronto, MD, <Dictator> Alric Seton MD ELECTRONICALLY SIGNED 04/01/2014 9:22

## 2014-07-26 DIAGNOSIS — M545 Low back pain: Secondary | ICD-10-CM | POA: Diagnosis not present

## 2014-07-26 DIAGNOSIS — C61 Malignant neoplasm of prostate: Secondary | ICD-10-CM | POA: Diagnosis not present

## 2014-07-26 DIAGNOSIS — S32008D Other fracture of unspecified lumbar vertebra, subsequent encounter for fracture with routine healing: Secondary | ICD-10-CM | POA: Diagnosis not present

## 2014-08-10 ENCOUNTER — Other Ambulatory Visit: Payer: Self-pay | Admitting: Family Medicine

## 2014-08-10 DIAGNOSIS — C61 Malignant neoplasm of prostate: Secondary | ICD-10-CM

## 2014-08-10 DIAGNOSIS — M545 Low back pain: Secondary | ICD-10-CM

## 2014-08-10 DIAGNOSIS — R1084 Generalized abdominal pain: Secondary | ICD-10-CM

## 2014-08-16 ENCOUNTER — Encounter
Admission: RE | Admit: 2014-08-16 | Discharge: 2014-08-16 | Disposition: A | Discharge status: Home / Self Care | Payer: Medicare Other | Source: Ambulatory Visit | Attending: Family Medicine | Admitting: Family Medicine

## 2014-08-16 DIAGNOSIS — M545 Low back pain: Secondary | ICD-10-CM | POA: Insufficient documentation

## 2014-08-16 DIAGNOSIS — R296 Repeated falls: Secondary | ICD-10-CM | POA: Diagnosis not present

## 2014-08-16 DIAGNOSIS — R109 Unspecified abdominal pain: Secondary | ICD-10-CM | POA: Diagnosis not present

## 2014-08-16 DIAGNOSIS — C61 Malignant neoplasm of prostate: Secondary | ICD-10-CM | POA: Diagnosis not present

## 2014-08-16 DIAGNOSIS — R1084 Generalized abdominal pain: Secondary | ICD-10-CM

## 2014-08-16 MED ORDER — TECHNETIUM TC 99M MEDRONATE IV KIT
25.0000 | PACK | Freq: Once | INTRAVENOUS | Status: AC | PRN
  Administered 2014-08-16: 23.27 via INTRAVENOUS

## 2014-08-22 DIAGNOSIS — K929 Disease of digestive system, unspecified: Secondary | ICD-10-CM | POA: Diagnosis not present

## 2014-08-22 DIAGNOSIS — M545 Low back pain: Secondary | ICD-10-CM | POA: Diagnosis not present

## 2014-08-22 DIAGNOSIS — C61 Malignant neoplasm of prostate: Secondary | ICD-10-CM | POA: Diagnosis not present

## 2014-08-22 DIAGNOSIS — R64 Cachexia: Secondary | ICD-10-CM | POA: Diagnosis not present

## 2014-08-22 DIAGNOSIS — K922 Gastrointestinal hemorrhage, unspecified: Secondary | ICD-10-CM | POA: Diagnosis not present

## 2014-08-22 DIAGNOSIS — M1990 Unspecified osteoarthritis, unspecified site: Secondary | ICD-10-CM | POA: Diagnosis not present

## 2014-08-22 LAB — HEPATIC FUNCTION PANEL
ALT: 9 U/L — AB (ref 10–40)
AST: 21 U/L (ref 14–40)
Alkaline Phosphatase: 107 U/L (ref 25–125)

## 2014-08-22 LAB — BASIC METABOLIC PANEL
BUN: 14 mg/dL (ref 4–21)
CREATININE: 0.9 mg/dL (ref 0.6–1.3)
Glucose: 97 mg/dL
Sodium: 141 mmol/L (ref 137–147)

## 2014-08-22 LAB — CBC AND DIFFERENTIAL
HCT: 37 % — AB (ref 41–53)
Hemoglobin: 11.9 g/dL — AB (ref 13.5–17.5)
NEUTROS ABS: 4 /uL
PLATELETS: 211 10*3/uL (ref 150–399)
WBC: 5.8 10*3/mL

## 2014-08-22 LAB — PSA: PSA: 29.3

## 2014-08-24 DIAGNOSIS — M519 Unspecified thoracic, thoracolumbar and lumbosacral intervertebral disc disorder: Secondary | ICD-10-CM | POA: Diagnosis not present

## 2014-08-24 DIAGNOSIS — K219 Gastro-esophageal reflux disease without esophagitis: Secondary | ICD-10-CM | POA: Diagnosis not present

## 2014-08-24 DIAGNOSIS — Z9181 History of falling: Secondary | ICD-10-CM | POA: Diagnosis not present

## 2014-08-24 DIAGNOSIS — J449 Chronic obstructive pulmonary disease, unspecified: Secondary | ICD-10-CM | POA: Diagnosis not present

## 2014-08-24 DIAGNOSIS — N400 Enlarged prostate without lower urinary tract symptoms: Secondary | ICD-10-CM | POA: Diagnosis not present

## 2014-08-24 DIAGNOSIS — M810 Age-related osteoporosis without current pathological fracture: Secondary | ICD-10-CM | POA: Diagnosis not present

## 2014-08-24 DIAGNOSIS — I10 Essential (primary) hypertension: Secondary | ICD-10-CM | POA: Diagnosis not present

## 2014-08-24 DIAGNOSIS — M21372 Foot drop, left foot: Secondary | ICD-10-CM | POA: Diagnosis not present

## 2014-08-30 ENCOUNTER — Institutional Professional Consult (permissible substitution): Payer: Medicare Other | Admitting: Radiation Oncology

## 2014-08-31 ENCOUNTER — Ambulatory Visit
Admission: RE | Admit: 2014-08-31 | Discharge: 2014-08-31 | Disposition: A | Discharge status: Home / Self Care | Payer: Medicare Other | Source: Ambulatory Visit | Attending: Radiation Oncology | Admitting: Radiation Oncology

## 2014-08-31 ENCOUNTER — Inpatient Hospital Stay: Payer: Medicare Other | Attending: Oncology | Admitting: Oncology

## 2014-08-31 ENCOUNTER — Inpatient Hospital Stay: Payer: Medicare Other

## 2014-08-31 ENCOUNTER — Encounter: Payer: Self-pay | Admitting: Radiation Oncology

## 2014-08-31 VITALS — BP 156/89 | HR 70 | Temp 96.8°F | Resp 21 | Ht 70.0 in | Wt 181.8 lb

## 2014-08-31 DIAGNOSIS — G8929 Other chronic pain: Secondary | ICD-10-CM

## 2014-08-31 DIAGNOSIS — R251 Tremor, unspecified: Secondary | ICD-10-CM

## 2014-08-31 DIAGNOSIS — Z800 Family history of malignant neoplasm of digestive organs: Secondary | ICD-10-CM | POA: Diagnosis not present

## 2014-08-31 DIAGNOSIS — M129 Arthropathy, unspecified: Secondary | ICD-10-CM | POA: Diagnosis not present

## 2014-08-31 DIAGNOSIS — M21372 Foot drop, left foot: Secondary | ICD-10-CM | POA: Insufficient documentation

## 2014-08-31 DIAGNOSIS — J449 Chronic obstructive pulmonary disease, unspecified: Secondary | ICD-10-CM | POA: Insufficient documentation

## 2014-08-31 DIAGNOSIS — C799 Secondary malignant neoplasm of unspecified site: Secondary | ICD-10-CM

## 2014-08-31 DIAGNOSIS — J45909 Unspecified asthma, uncomplicated: Secondary | ICD-10-CM | POA: Diagnosis not present

## 2014-08-31 DIAGNOSIS — C61 Malignant neoplasm of prostate: Secondary | ICD-10-CM

## 2014-08-31 DIAGNOSIS — Z79899 Other long term (current) drug therapy: Secondary | ICD-10-CM

## 2014-08-31 DIAGNOSIS — Z79810 Long term (current) use of selective estrogen receptor modulators (SERMs): Secondary | ICD-10-CM | POA: Insufficient documentation

## 2014-08-31 DIAGNOSIS — C801 Malignant (primary) neoplasm, unspecified: Secondary | ICD-10-CM | POA: Diagnosis not present

## 2014-08-31 DIAGNOSIS — C7951 Secondary malignant neoplasm of bone: Principal | ICD-10-CM

## 2014-08-31 DIAGNOSIS — Z8781 Personal history of (healed) traumatic fracture: Secondary | ICD-10-CM | POA: Insufficient documentation

## 2014-08-31 DIAGNOSIS — M549 Dorsalgia, unspecified: Secondary | ICD-10-CM | POA: Insufficient documentation

## 2014-08-31 DIAGNOSIS — H9190 Unspecified hearing loss, unspecified ear: Secondary | ICD-10-CM | POA: Insufficient documentation

## 2014-08-31 LAB — BASIC METABOLIC PANEL
Anion gap: 8 (ref 5–15)
BUN: 16 mg/dL (ref 6–20)
CO2: 25 mmol/L (ref 22–32)
Calcium: 9.6 mg/dL (ref 8.9–10.3)
Chloride: 106 mmol/L (ref 101–111)
Creatinine, Ser: 0.9 mg/dL (ref 0.61–1.24)
GFR calc Af Amer: >60 mL/min (ref >60)
GFR calc non Af Amer: >60 mL/min (ref >60)
Glucose, Bld: 101 mg/dL — ABNORMAL HIGH (ref 65–99)
POTASSIUM: 4.4 mmol/L (ref 3.5–5.1)
Sodium: 139 mmol/L (ref 135–145)

## 2014-08-31 LAB — PSA: PSA: 27.57 ng/mL — ABNORMAL HIGH (ref 0.00–4.00)

## 2014-08-31 NOTE — Progress Notes (Signed)
Patient here today for a evaluation of radiation treatment for prostate metastatic disease.

## 2014-08-31 NOTE — Consult Note (Signed)
Radiation Oncology NEW PATIENT EVALUATION  Name: Steven Greene  MRN: [id number]  Date:   08/31/2014     DOB: 01/06/1920   This 81 y.o. male patient presents to the clinic for initial evaluation of stage IV prostate cancer with bone metastasis.  REFERRING PHYSICIAN: Jerrol Greene.,*  CHIEF COMPLAINT: No chief complaint on file.   DIAGNOSIS: The encounter diagnosis was Metastatic disease.   PREVIOUS INVESTIGATIONS:  Bone scan and plain films of lumbar spine reviewed Clinical notes reviewed  HPI: Patient is a 81 year old male seen by his PMD Dr. Rosanna Greene for increasing back pain. Patient has a history of over 10 years of stage IV prostate cancer initially treated with [facility] agonist. Is also seeing medical oncology in the past although discontinued all follow-up care about 2 years prior. Patient is a right hip replacement. He had a bone scan which shows multiple areas of metastatic disease. Plain films of lumbar spine show degenerative changes at L2 and L3 with compression deformities. Bone scan also show some increased uptake in the SI joint region. Patient uses a cane for ambulatory assistance. He is referred to radiation oncology for opinion. He is also seeing medical oncology today for opinion.  PLANNED TREATMENT REGIMEN: Probable systemic treatment at this time hold palliative radiation therapy for worsening of symptoms  PAST MEDICAL HISTORY:  has a past medical history of Tremors of nervous system; COPD (chronic obstructive pulmonary disease); Asthma; Arthritis; Foot drop, left; HOH (hard of hearing); and Cancer.    PAST SURGICAL HISTORY:  Past Surgical History  Procedure Laterality Date  . Ankle fracture surgery  2009    lt  . Joint replacement  2000    rt hip replacement  . Hemorrhoid surgery    . Eye surgery      both cataracts  . Ankle fracture surgery      rt    FAMILY HISTORY: family history is not on file.  SOCIAL HISTORY:  reports that he has never smoked. He  has never used smokeless tobacco. He reports that he does not drink alcohol or use illicit drugs.  ALLERGIES: Review of patient's allergies indicates no known allergies.  MEDICATIONS:  Current Outpatient Prescriptions  Medication Sig Dispense Refill  . Fluticasone-Salmeterol (ADVAIR) 250-50 MCG/DOSE AEPB Inhale 1 puff into the lungs 2 (two) times daily. As needed    . HYDROcodone-acetaminophen (NORCO/VICODIN) 5-325 MG per tablet Take 1 tablet by mouth every 6 (six) hours as needed.    . meloxicam (MOBIC) 7.5 MG tablet Take 7.5 mg by mouth daily.    . propranolol (INDERAL) 20 MG tablet Take 20 mg by mouth 3 (three) times daily.     No current facility-administered medications for this encounter.    ECOG PERFORMANCE STATUS:  1 - Symptomatic but completely ambulatory  REVIEW OF SYSTEMS:  Patient denies any weight loss, fatigue, weakness, fever, chills or night sweats. Patient denies any loss of vision, blurred vision. Patient denies any ringing  of the ears or hearing loss. No irregular heartbeat. Patient denies heart murmur or history of fainting. Patient denies any chest pain or pain radiating to her upper extremities. Patient denies any shortness of breath, difficulty breathing at night, cough or hemoptysis. Patient denies any swelling in the lower legs. Patient denies any nausea vomiting, vomiting of blood, or coffee ground material in the vomitus. Patient denies any stomach pain. Patient states has had normal bowel movements no significant constipation or diarrhea. Patient denies any dysuria, hematuria or significant nocturia.  Patient denies any problems walking, swelling in the joints or loss of balance. Patient denies any skin changes, loss of hair or loss of weight. Patient denies any excessive worrying or anxiety or significant depression. Patient denies any problems with insomnia. Patient denies excessive thirst, polyuria, polydipsia. Patient denies any swollen glands, patient denies easy  bruising or easy bleeding. Patient denies any recent infections, allergies or URI. Patient "s visual fields have not changed significantly in recent time.    PHYSICAL EXAM: BP 156/89 mmHg  Pulse 70  Temp(Src) 96.8 F (36 C)  Resp 21  Ht 5\' 10"  (1.778 m)  Wt 181 lb 12.3 oz (82.45 kg)  BMI 26.08 kg/m2 Well-developed elderly male in NAD. Deep palpation of his thoracic lumbar spine does not elicit pain. Range of motion of the lower extremities does not elicit pain. Motor sensory and DTR levels in his lower extremities are equal and symmetric in the upper lower extremities. Proprioception is intact. Well-developed well-nourished patient in NAD. HEENT reveals PERLA, EOMI, discs not visualized.  Oral cavity is clear. No oral mucosal lesions are identified. Neck is clear without evidence of cervical or supraclavicular adenopathy. Lungs are clear to A&P. Cardiac examination is essentially unremarkable with regular rate and rhythm without murmur rub or thrill. Abdomen is benign with no organomegaly or masses noted. Motor sensory and DTR levels are equal and symmetric in the upper and lower extremities. Cranial nerves II through XII are grossly intact. Proprioception is intact. No peripheral adenopathy or edema is identified. No motor or sensory levels are noted. Crude visual fields are within normal range.   LABORATORY DATA:  No results found for this or any previous visit (from the past 72 hour(s)).   RADIOLOGY RESULTS: No results found.  IMPRESSION: Stage for prostate cancer in 81 year old male  PLAN: I discussed the case personally with medical oncology. Would probably recommend a systemic treatment at this time since he is not been symptomatic as far as pain is concerned. If we decide to treat his lumbar spine I would like an MRI scan of that area to further delineate what is just compression deformities as opposed to metastatic disease. I have told Dr. Grayland Greene I can reevaluate the patient any  time should pain progress. Patient is very comfortable at this time not having any palliative treatment. We'll follow-up accordingly as per Dr. Grayland Greene.  I would like to take this opportunity for allowing me to participate in the care of your patient.Armstead Peaks., MD

## 2014-09-01 DIAGNOSIS — Z9181 History of falling: Secondary | ICD-10-CM | POA: Diagnosis not present

## 2014-09-01 DIAGNOSIS — N400 Enlarged prostate without lower urinary tract symptoms: Secondary | ICD-10-CM | POA: Diagnosis not present

## 2014-09-01 DIAGNOSIS — K219 Gastro-esophageal reflux disease without esophagitis: Secondary | ICD-10-CM | POA: Diagnosis not present

## 2014-09-01 DIAGNOSIS — M810 Age-related osteoporosis without current pathological fracture: Secondary | ICD-10-CM | POA: Diagnosis not present

## 2014-09-01 DIAGNOSIS — I10 Essential (primary) hypertension: Secondary | ICD-10-CM | POA: Diagnosis not present

## 2014-09-01 DIAGNOSIS — J449 Chronic obstructive pulmonary disease, unspecified: Secondary | ICD-10-CM | POA: Diagnosis not present

## 2014-09-01 DIAGNOSIS — M21372 Foot drop, left foot: Secondary | ICD-10-CM | POA: Diagnosis not present

## 2014-09-01 DIAGNOSIS — M519 Unspecified thoracic, thoracolumbar and lumbosacral intervertebral disc disorder: Secondary | ICD-10-CM | POA: Diagnosis not present

## 2014-09-04 ENCOUNTER — Telehealth: Payer: Self-pay | Admitting: Family Medicine

## 2014-09-04 DIAGNOSIS — M519 Unspecified thoracic, thoracolumbar and lumbosacral intervertebral disc disorder: Secondary | ICD-10-CM | POA: Diagnosis not present

## 2014-09-04 DIAGNOSIS — I10 Essential (primary) hypertension: Secondary | ICD-10-CM | POA: Diagnosis not present

## 2014-09-04 DIAGNOSIS — K219 Gastro-esophageal reflux disease without esophagitis: Secondary | ICD-10-CM | POA: Diagnosis not present

## 2014-09-04 DIAGNOSIS — Z9181 History of falling: Secondary | ICD-10-CM | POA: Diagnosis not present

## 2014-09-04 DIAGNOSIS — J449 Chronic obstructive pulmonary disease, unspecified: Secondary | ICD-10-CM | POA: Diagnosis not present

## 2014-09-04 DIAGNOSIS — N400 Enlarged prostate without lower urinary tract symptoms: Secondary | ICD-10-CM | POA: Diagnosis not present

## 2014-09-04 DIAGNOSIS — M21372 Foot drop, left foot: Secondary | ICD-10-CM | POA: Diagnosis not present

## 2014-09-04 DIAGNOSIS — M810 Age-related osteoporosis without current pathological fracture: Secondary | ICD-10-CM | POA: Diagnosis not present

## 2014-09-06 DIAGNOSIS — K219 Gastro-esophageal reflux disease without esophagitis: Secondary | ICD-10-CM | POA: Diagnosis not present

## 2014-09-06 DIAGNOSIS — N400 Enlarged prostate without lower urinary tract symptoms: Secondary | ICD-10-CM | POA: Diagnosis not present

## 2014-09-06 DIAGNOSIS — M810 Age-related osteoporosis without current pathological fracture: Secondary | ICD-10-CM | POA: Diagnosis not present

## 2014-09-06 DIAGNOSIS — Z9181 History of falling: Secondary | ICD-10-CM | POA: Diagnosis not present

## 2014-09-06 DIAGNOSIS — M21372 Foot drop, left foot: Secondary | ICD-10-CM | POA: Diagnosis not present

## 2014-09-06 DIAGNOSIS — M519 Unspecified thoracic, thoracolumbar and lumbosacral intervertebral disc disorder: Secondary | ICD-10-CM | POA: Diagnosis not present

## 2014-09-06 DIAGNOSIS — I10 Essential (primary) hypertension: Secondary | ICD-10-CM | POA: Diagnosis not present

## 2014-09-06 DIAGNOSIS — J449 Chronic obstructive pulmonary disease, unspecified: Secondary | ICD-10-CM | POA: Diagnosis not present

## 2014-09-07 ENCOUNTER — Inpatient Hospital Stay: Payer: Medicare Other

## 2014-09-07 VITALS — BP 157/84 | HR 76 | Temp 96.7°F | Resp 18

## 2014-09-07 DIAGNOSIS — M129 Arthropathy, unspecified: Secondary | ICD-10-CM | POA: Diagnosis not present

## 2014-09-07 DIAGNOSIS — J45909 Unspecified asthma, uncomplicated: Secondary | ICD-10-CM | POA: Diagnosis not present

## 2014-09-07 DIAGNOSIS — G8929 Other chronic pain: Secondary | ICD-10-CM | POA: Diagnosis not present

## 2014-09-07 DIAGNOSIS — C61 Malignant neoplasm of prostate: Secondary | ICD-10-CM | POA: Diagnosis not present

## 2014-09-07 DIAGNOSIS — M549 Dorsalgia, unspecified: Secondary | ICD-10-CM | POA: Diagnosis not present

## 2014-09-07 DIAGNOSIS — H9190 Unspecified hearing loss, unspecified ear: Secondary | ICD-10-CM | POA: Diagnosis not present

## 2014-09-07 DIAGNOSIS — C7951 Secondary malignant neoplasm of bone: Secondary | ICD-10-CM | POA: Diagnosis not present

## 2014-09-07 DIAGNOSIS — Z79810 Long term (current) use of selective estrogen receptor modulators (SERMs): Secondary | ICD-10-CM | POA: Diagnosis not present

## 2014-09-07 DIAGNOSIS — Z8781 Personal history of (healed) traumatic fracture: Secondary | ICD-10-CM | POA: Diagnosis not present

## 2014-09-07 DIAGNOSIS — M21372 Foot drop, left foot: Secondary | ICD-10-CM | POA: Diagnosis not present

## 2014-09-07 DIAGNOSIS — R251 Tremor, unspecified: Secondary | ICD-10-CM | POA: Diagnosis not present

## 2014-09-07 DIAGNOSIS — Z800 Family history of malignant neoplasm of digestive organs: Secondary | ICD-10-CM | POA: Diagnosis not present

## 2014-09-07 DIAGNOSIS — J449 Chronic obstructive pulmonary disease, unspecified: Secondary | ICD-10-CM | POA: Diagnosis not present

## 2014-09-07 DIAGNOSIS — Z79899 Other long term (current) drug therapy: Secondary | ICD-10-CM | POA: Diagnosis not present

## 2014-09-07 MED ORDER — DENOSUMAB 120 MG/1.7ML ~~LOC~~ SOLN
120.0000 mg | Freq: Once | SUBCUTANEOUS | Status: AC
  Administered 2014-09-07: 120 mg via SUBCUTANEOUS
  Filled 2014-09-07: qty 1.7

## 2014-09-07 MED ORDER — LEUPROLIDE ACETATE (6 MONTH) 45 MG IM KIT
45.0000 mg | PACK | Freq: Once | INTRAMUSCULAR | Status: AC
  Administered 2014-09-07: 45 mg via INTRAMUSCULAR
  Filled 2014-09-07: qty 45

## 2014-09-07 MED ORDER — LEUPROLIDE ACETATE (4 MONTH) 30 MG IM KIT: 45.0000 mg | PACK | Freq: Once | INTRAMUSCULAR | Status: DC

## 2014-09-08 DIAGNOSIS — Z9181 History of falling: Secondary | ICD-10-CM | POA: Diagnosis not present

## 2014-09-08 DIAGNOSIS — K219 Gastro-esophageal reflux disease without esophagitis: Secondary | ICD-10-CM | POA: Diagnosis not present

## 2014-09-08 DIAGNOSIS — M21372 Foot drop, left foot: Secondary | ICD-10-CM | POA: Diagnosis not present

## 2014-09-08 DIAGNOSIS — I10 Essential (primary) hypertension: Secondary | ICD-10-CM | POA: Diagnosis not present

## 2014-09-08 DIAGNOSIS — J449 Chronic obstructive pulmonary disease, unspecified: Secondary | ICD-10-CM | POA: Diagnosis not present

## 2014-09-08 DIAGNOSIS — M810 Age-related osteoporosis without current pathological fracture: Secondary | ICD-10-CM | POA: Diagnosis not present

## 2014-09-08 DIAGNOSIS — N400 Enlarged prostate without lower urinary tract symptoms: Secondary | ICD-10-CM | POA: Diagnosis not present

## 2014-09-08 DIAGNOSIS — M519 Unspecified thoracic, thoracolumbar and lumbosacral intervertebral disc disorder: Secondary | ICD-10-CM | POA: Diagnosis not present

## 2014-09-11 DIAGNOSIS — I10 Essential (primary) hypertension: Secondary | ICD-10-CM | POA: Diagnosis not present

## 2014-09-11 DIAGNOSIS — J449 Chronic obstructive pulmonary disease, unspecified: Secondary | ICD-10-CM | POA: Diagnosis not present

## 2014-09-11 DIAGNOSIS — N400 Enlarged prostate without lower urinary tract symptoms: Secondary | ICD-10-CM | POA: Diagnosis not present

## 2014-09-11 DIAGNOSIS — Z9181 History of falling: Secondary | ICD-10-CM | POA: Diagnosis not present

## 2014-09-11 DIAGNOSIS — M519 Unspecified thoracic, thoracolumbar and lumbosacral intervertebral disc disorder: Secondary | ICD-10-CM | POA: Diagnosis not present

## 2014-09-11 DIAGNOSIS — M21372 Foot drop, left foot: Secondary | ICD-10-CM | POA: Diagnosis not present

## 2014-09-11 DIAGNOSIS — M810 Age-related osteoporosis without current pathological fracture: Secondary | ICD-10-CM | POA: Diagnosis not present

## 2014-09-11 DIAGNOSIS — K219 Gastro-esophageal reflux disease without esophagitis: Secondary | ICD-10-CM | POA: Diagnosis not present

## 2014-09-14 NOTE — Telephone Encounter (Signed)
Referral completed

## 2014-09-15 DIAGNOSIS — N400 Enlarged prostate without lower urinary tract symptoms: Secondary | ICD-10-CM | POA: Diagnosis not present

## 2014-09-15 DIAGNOSIS — Z9181 History of falling: Secondary | ICD-10-CM | POA: Diagnosis not present

## 2014-09-15 DIAGNOSIS — I10 Essential (primary) hypertension: Secondary | ICD-10-CM | POA: Diagnosis not present

## 2014-09-15 DIAGNOSIS — J449 Chronic obstructive pulmonary disease, unspecified: Secondary | ICD-10-CM | POA: Diagnosis not present

## 2014-09-15 DIAGNOSIS — M519 Unspecified thoracic, thoracolumbar and lumbosacral intervertebral disc disorder: Secondary | ICD-10-CM | POA: Diagnosis not present

## 2014-09-15 DIAGNOSIS — M810 Age-related osteoporosis without current pathological fracture: Secondary | ICD-10-CM | POA: Diagnosis not present

## 2014-09-15 DIAGNOSIS — K219 Gastro-esophageal reflux disease without esophagitis: Secondary | ICD-10-CM | POA: Diagnosis not present

## 2014-09-15 DIAGNOSIS — M21372 Foot drop, left foot: Secondary | ICD-10-CM | POA: Diagnosis not present

## 2014-09-18 DIAGNOSIS — Z9181 History of falling: Secondary | ICD-10-CM | POA: Diagnosis not present

## 2014-09-18 DIAGNOSIS — J449 Chronic obstructive pulmonary disease, unspecified: Secondary | ICD-10-CM | POA: Diagnosis not present

## 2014-09-18 DIAGNOSIS — K219 Gastro-esophageal reflux disease without esophagitis: Secondary | ICD-10-CM | POA: Diagnosis not present

## 2014-09-18 DIAGNOSIS — M519 Unspecified thoracic, thoracolumbar and lumbosacral intervertebral disc disorder: Secondary | ICD-10-CM | POA: Diagnosis not present

## 2014-09-18 DIAGNOSIS — N400 Enlarged prostate without lower urinary tract symptoms: Secondary | ICD-10-CM | POA: Diagnosis not present

## 2014-09-18 DIAGNOSIS — M21372 Foot drop, left foot: Secondary | ICD-10-CM | POA: Diagnosis not present

## 2014-09-18 DIAGNOSIS — I10 Essential (primary) hypertension: Secondary | ICD-10-CM | POA: Diagnosis not present

## 2014-09-18 DIAGNOSIS — M810 Age-related osteoporosis without current pathological fracture: Secondary | ICD-10-CM | POA: Diagnosis not present

## 2014-09-20 DIAGNOSIS — I10 Essential (primary) hypertension: Secondary | ICD-10-CM | POA: Diagnosis not present

## 2014-09-20 DIAGNOSIS — M810 Age-related osteoporosis without current pathological fracture: Secondary | ICD-10-CM | POA: Diagnosis not present

## 2014-09-20 DIAGNOSIS — Z9181 History of falling: Secondary | ICD-10-CM | POA: Diagnosis not present

## 2014-09-20 DIAGNOSIS — J449 Chronic obstructive pulmonary disease, unspecified: Secondary | ICD-10-CM | POA: Diagnosis not present

## 2014-09-20 DIAGNOSIS — K219 Gastro-esophageal reflux disease without esophagitis: Secondary | ICD-10-CM | POA: Diagnosis not present

## 2014-09-20 DIAGNOSIS — M21372 Foot drop, left foot: Secondary | ICD-10-CM | POA: Diagnosis not present

## 2014-09-20 DIAGNOSIS — M519 Unspecified thoracic, thoracolumbar and lumbosacral intervertebral disc disorder: Secondary | ICD-10-CM | POA: Diagnosis not present

## 2014-09-20 DIAGNOSIS — N400 Enlarged prostate without lower urinary tract symptoms: Secondary | ICD-10-CM | POA: Diagnosis not present

## 2014-09-24 NOTE — Progress Notes (Signed)
[place]  [phone] [phone]  ID: Steven Greene OB: 1919-06-26  MR#: [id number]  DGL#:[id number]  Patient Care Team: Jerrol Banana., MD as PCP - General (Unknown Physician Specialty)  CHIEF COMPLAINT: No chief complaint on file.   INTERVAL HISTORY: Patient last evaluated in clinic in April 2014. He is referred back for further evaluation after having a nuclear med bone scan that revealed progression of disease. His PSA is also increasing. He continues to have persistent back pain, but this is chronic and unchanged. He has no neurologic complaints. He denies any recent fevers. He has a good appetite and denies weight loss. He denies any chest pain or shortness of breath. He has no nausea, vomiting, constipation, or diarrhea. He has no urinary complaints. Patient offers no further specific complaints today.  REVIEW OF SYSTEMS:   Review of Systems  Constitutional: Negative for weight loss and malaise/fatigue.  Respiratory: Negative.   Cardiovascular: Negative.   Musculoskeletal: Positive for back pain.  Neurological: Negative for weakness.    As per HPI. Otherwise, a complete review of systems is negatve.  PAST MEDICAL HISTORY: Past Medical History  Diagnosis Date  . Tremors of nervous system   . COPD (chronic obstructive pulmonary disease)   . Asthma   . Arthritis   . Foot drop, left   . HOH (hard of hearing)   . Cancer     prostate cancer-treated medically    PAST SURGICAL HISTORY: Past Surgical History  Procedure Laterality Date  . Ankle fracture surgery  2009    lt  . Joint replacement  2000    rt hip replacement  . Hemorrhoid surgery    . Eye surgery      both cataracts  . Ankle fracture surgery      rt    FAMILY HISTORY: Son with T-cell lymphoma, siblings with pancreatic cancer.     ADVANCED DIRECTIVES:    HEALTH MAINTENANCE: History  Substance Use Topics  . Smoking status: Never Smoker   . Smokeless  tobacco: Never Used  . Alcohol Use: No     Colonoscopy:  PAP:  Bone density:  Lipid panel:  No Known Allergies  Current Outpatient Prescriptions  Medication Sig Dispense Refill  . Fluticasone-Salmeterol (ADVAIR) 250-50 MCG/DOSE AEPB Inhale 1 puff into the lungs 2 (two) times daily. As needed    . HYDROcodone-acetaminophen (NORCO/VICODIN) 5-325 MG per tablet Take 1 tablet by mouth every 6 (six) hours as needed.    . meloxicam (MOBIC) 7.5 MG tablet Take 7.5 mg by mouth daily.    . propranolol (INDERAL) 20 MG tablet Take 20 mg by mouth 3 (three) times daily.     No current facility-administered medications for this visit.    OBJECTIVE: There were no vitals filed for this visit.   There is no weight on file to calculate BMI.    ECOG FS:1 - Symptomatic but completely ambulatory  General: Well-developed, well-nourished, no acute distress. Eyes: anicteric sclera. Lungs: Clear to auscultation bilaterally. Heart: Regular rate and rhythm. No rubs, murmurs, or gallops. Abdomen: Soft, nontender, nondistended. No organomegaly noted, normoactive bowel sounds. Musculoskeletal: No edema, cyanosis, or clubbing. Neuro: Alert, answering all questions appropriately. Cranial nerves grossly intact. Skin: No rashes or petechiae noted. Psych: Normal affect.   LAB RESULTS:  Lab Results  Component Value Date   NA 139 08/31/2014   K 4.4 08/31/2014   CL 106 08/31/2014   CO2 25 08/31/2014   GLUCOSE 101* 08/31/2014  BUN 16 08/31/2014   CREATININE 0.90 08/31/2014   CALCIUM 9.6 08/31/2014   PROT 6.6 03/22/2014   ALBUMIN 3.3* 03/22/2014   AST 32 03/22/2014   ALT 21 03/22/2014   ALKPHOS 80 03/22/2014   GFRNONAA >60 08/31/2014   GFRAA >60 08/31/2014    Lab Results  Component Value Date   WBC 4.4 03/26/2014   NEUTROABS 2.6 03/26/2014   HGB 8.1* 03/26/2014   HCT 25.2* 03/26/2014   MCV 94 03/26/2014   PLT 126* 03/26/2014     STUDIES: No results found.  ASSESSMENT: Progressive stage  IV pancreatic cancer with bony metastasis.  PLAN:    1. Prostate cancer: Nuclear med bone scan from Aug 16, 2014 revealed progressive disease. Patient's PSA is also increasing and now greater than 23. He has agreed to reinitiate Lupron and will return to clinic in 1 week to receive 45 mg. He also has agreed to receive Delton See, but declines to return to clinic on a monthly basis for injections therefore will return to clinic in 3 months for further evaluation, laboratory work, and continuation of Xgeva. His next Lupron will be scheduled for December 2016.  Greater than 30 minutes was spent in discussion and consultation.  Patient expressed understanding and was in agreement with this plan. He also understands that He can call clinic at any time with any questions, concerns, or complaints.   Prostate cancer metastatic to bone   Staging form: Prostate, AJCC 7th Edition     Clinical stage from 09/24/2014: Stage IV (TX, NX, M1b, PSA: 20 or greater, Gleason X - Gleason score cannot be processed) - Signed by Lloyd Huger, MD on 09/24/2014   Lloyd Huger, MD   09/24/2014 1:50 PM

## 2014-11-30 ENCOUNTER — Other Ambulatory Visit: Payer: Self-pay | Admitting: *Deleted

## 2014-11-30 DIAGNOSIS — C7951 Secondary malignant neoplasm of bone: Principal | ICD-10-CM

## 2014-11-30 DIAGNOSIS — C61 Malignant neoplasm of prostate: Secondary | ICD-10-CM

## 2014-12-04 ENCOUNTER — Inpatient Hospital Stay: Payer: Medicare Other | Attending: Oncology

## 2014-12-04 ENCOUNTER — Inpatient Hospital Stay (HOSPITAL_BASED_OUTPATIENT_CLINIC_OR_DEPARTMENT_OTHER): Payer: Medicare Other | Admitting: Oncology

## 2014-12-04 ENCOUNTER — Inpatient Hospital Stay: Payer: Medicare Other

## 2014-12-04 VITALS — BP 159/85 | HR 72 | Temp 97.1°F | Resp 16 | Wt 178.3 lb

## 2014-12-04 DIAGNOSIS — M129 Arthropathy, unspecified: Secondary | ICD-10-CM

## 2014-12-04 DIAGNOSIS — J449 Chronic obstructive pulmonary disease, unspecified: Secondary | ICD-10-CM | POA: Diagnosis not present

## 2014-12-04 DIAGNOSIS — D696 Thrombocytopenia, unspecified: Secondary | ICD-10-CM | POA: Insufficient documentation

## 2014-12-04 DIAGNOSIS — C61 Malignant neoplasm of prostate: Secondary | ICD-10-CM

## 2014-12-04 DIAGNOSIS — M549 Dorsalgia, unspecified: Secondary | ICD-10-CM | POA: Insufficient documentation

## 2014-12-04 DIAGNOSIS — D649 Anemia, unspecified: Secondary | ICD-10-CM

## 2014-12-04 DIAGNOSIS — Z79899 Other long term (current) drug therapy: Secondary | ICD-10-CM | POA: Diagnosis not present

## 2014-12-04 DIAGNOSIS — M21372 Foot drop, left foot: Secondary | ICD-10-CM | POA: Diagnosis not present

## 2014-12-04 DIAGNOSIS — H9190 Unspecified hearing loss, unspecified ear: Secondary | ICD-10-CM | POA: Diagnosis not present

## 2014-12-04 DIAGNOSIS — C7951 Secondary malignant neoplasm of bone: Secondary | ICD-10-CM | POA: Insufficient documentation

## 2014-12-04 DIAGNOSIS — J45909 Unspecified asthma, uncomplicated: Secondary | ICD-10-CM

## 2014-12-04 LAB — BASIC METABOLIC PANEL
Anion gap: 5 (ref 5–15)
BUN: 12 mg/dL (ref 6–20)
CHLORIDE: 104 mmol/L (ref 101–111)
CO2: 27 mmol/L (ref 22–32)
Calcium: 8.5 mg/dL — ABNORMAL LOW (ref 8.9–10.3)
Creatinine, Ser: 0.81 mg/dL (ref 0.61–1.24)
GFR calc Af Amer: >60 mL/min (ref >60)
GLUCOSE: 103 mg/dL — AB (ref 65–99)
Potassium: 4.4 mmol/L (ref 3.5–5.1)
Sodium: 136 mmol/L (ref 135–145)

## 2014-12-04 LAB — PSA: PSA: 26.15 ng/mL — AB (ref 0.00–4.00)

## 2014-12-04 MED ORDER — DENOSUMAB 120 MG/1.7ML ~~LOC~~ SOLN
120.0000 mg | Freq: Once | SUBCUTANEOUS | Status: AC
  Administered 2014-12-04: 120 mg via SUBCUTANEOUS
  Filled 2014-12-04: qty 1.7

## 2014-12-07 NOTE — Progress Notes (Signed)
[place]  [phone] [phone]  ID: [name] OB: [birth year]  MR#: [id number]  LYY#:[id number]  Patient Care Team: Jerrol Banana., MD as PCP - General (Unknown Physician Specialty)  CHIEF COMPLAINT:  Chief Complaint  Patient presents with  . Prostate Cancer    INTERVAL HISTORY: Patient returns to clinic today for further evaluation and continuation of Xgeva. He continues to have persistent back pain, but this is chronic and unchanged. He complained of some weakness and fatigue after his most recent Lupron injection but this has since resolved. He has no neurologic complaints. He denies any recent fevers. He has a good appetite and denies weight loss. He denies any chest pain or shortness of breath. He has no nausea, vomiting, constipation, or diarrhea. He has no urinary complaints. Patient offers no further specific complaints today.  REVIEW OF SYSTEMS:   Review of Systems  Constitutional: Negative for weight loss and malaise/fatigue.  Respiratory: Negative.   Cardiovascular: Negative.   Musculoskeletal: Positive for back pain.  Neurological: Negative for weakness.    As per HPI. Otherwise, a complete review of systems is negatve.  PAST MEDICAL HISTORY: Past Medical History  Diagnosis Date  . Tremors of nervous system   . COPD (chronic obstructive pulmonary disease)   . Asthma   . Arthritis   . Foot drop, left   . HOH (hard of hearing)   . Cancer     prostate cancer-treated medically    PAST SURGICAL HISTORY: Past Surgical History  Procedure Laterality Date  . Ankle fracture surgery  2009    lt  . Joint replacement  2000    rt hip replacement  . Hemorrhoid surgery    . Eye surgery      both cataracts  . Ankle fracture surgery      rt    FAMILY HISTORY: Son with T-cell lymphoma, siblings with pancreatic cancer.     ADVANCED DIRECTIVES:    HEALTH MAINTENANCE: Social History  Substance Use Topics  .  Smoking status: Never Smoker   . Smokeless tobacco: Never Used  . Alcohol Use: No     Colonoscopy:  PAP:  Bone density:  Lipid panel:  No Known Allergies  Current Outpatient Prescriptions  Medication Sig Dispense Refill  . acetaminophen (TYLENOL) 500 MG tablet Take 500 mg by mouth every 6 (six) hours as needed.    . Fluticasone-Salmeterol (ADVAIR) 250-50 MCG/DOSE AEPB Inhale 1 puff into the lungs 2 (two) times daily. As needed    . pantoprazole (PROTONIX) 40 MG tablet Take 40 mg by mouth daily as needed.    . propranolol (INDERAL) 20 MG tablet Take 20 mg by mouth 3 (three) times daily.     No current facility-administered medications for this visit.    OBJECTIVE: Filed Vitals:   12/04/14 1435  BP: 159/85  Pulse: 72  Temp: 97.1 F (36.2 C)  Resp: 16     Body mass index is 25.59 kg/(m^2).    ECOG FS:1 - Symptomatic but completely ambulatory  General: Well-developed, well-nourished, no acute distress. Eyes: anicteric sclera. Lungs: Clear to auscultation bilaterally. Heart: Regular rate and rhythm. No rubs, murmurs, or gallops. Abdomen: Soft, nontender, nondistended. No organomegaly noted, normoactive bowel sounds. Musculoskeletal: No edema, cyanosis, or clubbing. Neuro: Alert, answering all questions appropriately. Cranial nerves grossly intact. Skin: No rashes or petechiae noted. Psych: Normal affect.   LAB RESULTS:  Lab Results  Component Value Date   NA 136 12/04/2014  K 4.4 12/04/2014   CL 104 12/04/2014   CO2 27 12/04/2014   GLUCOSE 103* 12/04/2014   BUN 12 12/04/2014   CREATININE 0.81 12/04/2014   CALCIUM 8.5* 12/04/2014   PROT 6.6 03/22/2014   ALBUMIN 3.3* 03/22/2014   AST 32 03/22/2014   ALT 21 03/22/2014   ALKPHOS 80 03/22/2014   BILITOT 0.3 03/22/2014   GFRNONAA >60 12/04/2014   GFRAA >60 12/04/2014    Lab Results  Component Value Date   WBC 4.4 03/26/2014   NEUTROABS 2.6 03/26/2014   HGB 8.1* 03/26/2014   HCT 25.2* 03/26/2014   MCV 94  03/26/2014   PLT 126* 03/26/2014     STUDIES: No results found.  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  PLAN:    1. Prostate cancer: Nuclear med bone scan from Aug 16, 2014 revealed progressive disease. Patient's PSA is also essentially stable. It was 27.6 in June and now 26.1 today. Patient last received 45mg  Lupron in June 2016. Proceed with Xgeva today. He continues to refuse monthly treatments as recommended, therefore will return to clinic in 3 months for further evaluation and continuation of Xgeva. His next Lupron will be scheduled for December 2016. 2. Anemia: Monitor, consider iron stores with next blood draw. 3. Thrombocytopenia: Mild. Monitor.  Patient expressed understanding and was in agreement with this plan. He also understands that He can call clinic at any time with any questions, concerns, or complaints.   Prostate cancer metastatic to bone   Staging form: Prostate, AJCC 7th Edition     Clinical stage from 09/24/2014: Stage IV (TX, NX, M1b, PSA: 20 or greater, Gleason X - Gleason score cannot be processed) - Signed by Lloyd Huger, MD on 09/24/2014   Lloyd Huger, MD   12/07/2014 12:22 PM

## 2014-12-08 DIAGNOSIS — J449 Chronic obstructive pulmonary disease, unspecified: Secondary | ICD-10-CM | POA: Insufficient documentation

## 2014-12-08 DIAGNOSIS — M549 Dorsalgia, unspecified: Principal | ICD-10-CM

## 2014-12-08 DIAGNOSIS — J45909 Unspecified asthma, uncomplicated: Secondary | ICD-10-CM | POA: Insufficient documentation

## 2014-12-08 DIAGNOSIS — M1990 Unspecified osteoarthritis, unspecified site: Secondary | ICD-10-CM | POA: Insufficient documentation

## 2014-12-08 DIAGNOSIS — R251 Tremor, unspecified: Secondary | ICD-10-CM

## 2014-12-08 DIAGNOSIS — I10 Essential (primary) hypertension: Secondary | ICD-10-CM | POA: Insufficient documentation

## 2014-12-08 DIAGNOSIS — G8929 Other chronic pain: Secondary | ICD-10-CM

## 2014-12-08 DIAGNOSIS — K219 Gastro-esophageal reflux disease without esophagitis: Secondary | ICD-10-CM | POA: Insufficient documentation

## 2014-12-08 DIAGNOSIS — N400 Enlarged prostate without lower urinary tract symptoms: Secondary | ICD-10-CM | POA: Insufficient documentation

## 2014-12-11 ENCOUNTER — Ambulatory Visit (INDEPENDENT_AMBULATORY_CARE_PROVIDER_SITE_OTHER): Payer: Medicare Other | Admitting: Family Medicine

## 2014-12-11 ENCOUNTER — Encounter: Payer: Self-pay | Admitting: Family Medicine

## 2014-12-11 VITALS — BP 138/70 | HR 80 | Temp 98.1°F | Resp 16 | Wt 178.0 lb

## 2014-12-11 DIAGNOSIS — I10 Essential (primary) hypertension: Secondary | ICD-10-CM | POA: Diagnosis not present

## 2014-12-11 DIAGNOSIS — Z23 Encounter for immunization: Secondary | ICD-10-CM

## 2014-12-11 DIAGNOSIS — J449 Chronic obstructive pulmonary disease, unspecified: Secondary | ICD-10-CM | POA: Diagnosis not present

## 2014-12-11 DIAGNOSIS — C61 Malignant neoplasm of prostate: Secondary | ICD-10-CM

## 2014-12-11 DIAGNOSIS — C7951 Secondary malignant neoplasm of bone: Secondary | ICD-10-CM | POA: Diagnosis not present

## 2014-12-11 NOTE — Progress Notes (Signed)
Patient ID: Steven Greene, male   DOB: 03-22-1920, 81 y.o.   MRN: [id number]    Subjective:  HPI  Hypertension, follow-up:  BP Readings from Last 3 Encounters:  12/11/14 138/70  08/22/14 152/84  12/04/14 159/85    He was last seen for hypertension 3 months ago.  BP at that visit was 152/84. Management since that visit includes none. He reports good compliance with treatment. He is not having side effects.  He is not exercising. He is adherent to low salt diet.   Outside blood pressures are not being checked. He is experiencing none.    Wt Readings from Last 3 Encounters:  12/11/14 178 lb (80.74 kg)  08/22/14 184 lb (83.462 kg)  12/04/14 178 lb 5.6 oz (80.899 kg)   -------------------------------------------- ----------------------------  Pt is also following up on Metastatic prostate cancer to the bone. He had recent labs from the cancer center that he would like to discuss. PSA is stable at 26. Last PSA was 27 and before that was 29. He wants to know what to do about the prostate cancer and complains bitterly about the cost of the Lupron.     Prior to Admission medications   Medication Sig Start Date End Date Taking? Authorizing Provider  acetaminophen (TYLENOL) 500 MG tablet Take 500 mg by mouth every 6 (six) hours as needed.   Yes Historical Provider, MD  Budesonide-Formoterol Fumarate (SYMBICORT IN) Inhale into the lungs.   Yes Historical Provider, MD  pantoprazole (PROTONIX) 40 MG tablet Take 40 mg by mouth daily as needed.   Yes Historical Provider, MD  propranolol (INDERAL) 20 MG tablet Take 20 mg by mouth 3 (three) times daily.   Yes Historical Provider, MD    Patient Active Problem List   Diagnosis Date Noted  . Hypertension 12/08/2014  . Chronic obstructive airway disease 12/08/2014  . Asthma 12/08/2014  . GERD (gastroesophageal reflux disease) 12/08/2014  . Osteoarthritis 12/08/2014  . Chronic back pain 12/08/2014  . BPH (benign prostatic hypertrophy)  12/08/2014  . Tremor 12/08/2014  . Prostate cancer metastatic to bone 08/31/2014    Past Medical History  Diagnosis Date  . Tremors of nervous system   . COPD (chronic obstructive pulmonary disease)   . Asthma   . Arthritis   . Foot drop, left   . HOH (hard of hearing)   . Cancer     prostate cancer-treated medically    Social History   Social History  . Marital Status: Married    Spouse Name: N/A  . Number of Children: N/A  . Years of Education: N/A   Occupational History  . Not on file.   Social History Main Topics  . Smoking status: Never Smoker   . Smokeless tobacco: Never Used  . Alcohol Use: No  . Drug Use: No  . Sexual Activity: Not on file   Other Topics Concern  . Not on file   Social History Narrative    No Known Allergies  Review of Systems  Constitutional: Negative.   HENT: Negative.   Eyes: Negative.   Respiratory: Negative.   Cardiovascular: Negative.   Gastrointestinal: Negative.   Genitourinary: Negative.   Musculoskeletal: Negative.   Skin: Negative.   Neurological: Negative.   Endo/Heme/Allergies: Negative.   Psychiatric/Behavioral: Negative.     Immunization History  Administered Date(s) Administered  . Pneumococcal Polysaccharide-23 02/02/2012  . Tdap 02/10/2013   Objective:  BP 138/70 mmHg  Pulse 80  Temp(Src) 98.1 F (36.7 C) (Oral)  Resp 16  Wt 178 lb (80.74 kg)  Physical Exam  Constitutional: He is oriented to person, place, and time and well-developed, well-nourished, and in no distress.  HENT:  Head: Normocephalic and atraumatic.  Right Ear: External ear normal.  Left Ear: External ear normal.  Nose: Nose normal.  Eyes: Conjunctivae are normal.  Neck: Neck supple.  Cardiovascular: Normal rate, regular rhythm and normal heart sounds.   Pulmonary/Chest: Effort normal and breath sounds normal.  Abdominal: Soft.  Musculoskeletal:  Increased thoracic kyphosis that is mild.  Neurological: He is alert and oriented  to person, place, and time.  Chronic foot drop  Skin: Skin is warm and dry.  Psychiatric: Mood, memory, affect and judgment normal.    Lab Results  Component Value Date   WBC 5.8 08/22/2014   HGB 11.9* 08/22/2014   HCT 37* 08/22/2014   PLT 211 08/22/2014   GLUCOSE 103* 12/04/2014   PSA 26.15* 12/04/2014   INR 0.8 03/22/2014    CMP     Component Value Date/Time   NA 136 12/04/2014 1412   NA 141 08/22/2014   NA 145 03/26/2014 0424   K 4.4 12/04/2014 1412   K 3.5 03/26/2014 0424   CL 104 12/04/2014 1412   CL 110* 03/26/2014 0424   CO2 27 12/04/2014 1412   CO2 28 03/26/2014 0424   GLUCOSE 103* 12/04/2014 1412   GLUCOSE 99 03/26/2014 0424   BUN 12 12/04/2014 1412   BUN 14 08/22/2014   BUN 9 03/26/2014 0424   CREATININE 0.81 12/04/2014 1412   CREATININE 0.9 08/22/2014   CREATININE 0.95 03/26/2014 0424   CALCIUM 8.5* 12/04/2014 1412   CALCIUM 7.9* 03/26/2014 0424   PROT 6.6 03/22/2014 1540   ALBUMIN 3.3* 03/22/2014 1540   AST 21 08/22/2014   AST 32 03/22/2014 1540   ALT 9* 08/22/2014   ALT 21 03/22/2014 1540   ALKPHOS 107 08/22/2014   ALKPHOS 80 03/22/2014 1540   BILITOT 0.3 03/22/2014 1540   GFRNONAA >60 12/04/2014 1412   GFRNONAA >60 03/26/2014 0424   GFRNONAA >60 07/09/2012 1110   GFRAA >60 12/04/2014 1412   GFRAA >60 03/26/2014 0424   GFRAA >60 07/09/2012 1110    Assessment and Plan :  1. Prostate cancer metastatic to bone His quality of life is very good. In general I would recommend continue treatment as long as he has good health and alertness. He has no cognitive impairment at all.  2. Essential hypertension More than 50% of this visit was spent in counseling regarding his issues.  3. Chronic obstructive pulmonary disease, unspecified COPD, unspecified chronic bronchitis type Controlle  4. Need for influenza vaccination  - Flu vaccine HIGH DOSE PF 5. Chronic foot drop Patient does walk with a cane. Consider evaluation by PT to see if he should  consider a walker in the future. Also PT evaluation for fall risk assessment.  Miguel Aschoff MD [facility] 12/11/2014 2:50 PM

## 2015-03-05 ENCOUNTER — Inpatient Hospital Stay: Payer: Medicare Other | Admitting: Oncology

## 2015-03-05 ENCOUNTER — Inpatient Hospital Stay: Payer: Medicare Other

## 2015-03-12 ENCOUNTER — Inpatient Hospital Stay: Payer: Medicare Other

## 2015-03-12 ENCOUNTER — Inpatient Hospital Stay: Payer: Medicare Other | Attending: Oncology

## 2015-03-12 ENCOUNTER — Inpatient Hospital Stay (HOSPITAL_BASED_OUTPATIENT_CLINIC_OR_DEPARTMENT_OTHER): Payer: Medicare Other | Admitting: Oncology

## 2015-03-12 VITALS — BP 147/70 | HR 90 | Temp 97.5°F | Resp 18 | Wt 176.8 lb

## 2015-03-12 DIAGNOSIS — C61 Malignant neoplasm of prostate: Secondary | ICD-10-CM | POA: Diagnosis not present

## 2015-03-12 DIAGNOSIS — J45909 Unspecified asthma, uncomplicated: Secondary | ICD-10-CM

## 2015-03-12 DIAGNOSIS — D649 Anemia, unspecified: Secondary | ICD-10-CM | POA: Diagnosis not present

## 2015-03-12 DIAGNOSIS — R251 Tremor, unspecified: Secondary | ICD-10-CM | POA: Insufficient documentation

## 2015-03-12 DIAGNOSIS — C7951 Secondary malignant neoplasm of bone: Secondary | ICD-10-CM

## 2015-03-12 DIAGNOSIS — J449 Chronic obstructive pulmonary disease, unspecified: Secondary | ICD-10-CM

## 2015-03-12 DIAGNOSIS — R531 Weakness: Secondary | ICD-10-CM | POA: Diagnosis not present

## 2015-03-12 DIAGNOSIS — Z79899 Other long term (current) drug therapy: Secondary | ICD-10-CM

## 2015-03-12 DIAGNOSIS — H9190 Unspecified hearing loss, unspecified ear: Secondary | ICD-10-CM

## 2015-03-12 DIAGNOSIS — M549 Dorsalgia, unspecified: Secondary | ICD-10-CM

## 2015-03-12 DIAGNOSIS — M21372 Foot drop, left foot: Secondary | ICD-10-CM | POA: Insufficient documentation

## 2015-03-12 DIAGNOSIS — R5383 Other fatigue: Secondary | ICD-10-CM

## 2015-03-12 DIAGNOSIS — Z79818 Long term (current) use of other agents affecting estrogen receptors and estrogen levels: Secondary | ICD-10-CM | POA: Diagnosis not present

## 2015-03-12 DIAGNOSIS — M129 Arthropathy, unspecified: Secondary | ICD-10-CM | POA: Insufficient documentation

## 2015-03-12 LAB — BASIC METABOLIC PANEL
ANION GAP: 6 (ref 5–15)
BUN: 19 mg/dL (ref 6–20)
CO2: 30 mmol/L (ref 22–32)
Calcium: 9.1 mg/dL (ref 8.9–10.3)
Chloride: 98 mmol/L — ABNORMAL LOW (ref 101–111)
Creatinine, Ser: 0.99 mg/dL (ref 0.61–1.24)
Glucose, Bld: 94 mg/dL (ref 65–99)
POTASSIUM: 4.1 mmol/L (ref 3.5–5.1)
SODIUM: 134 mmol/L — AB (ref 135–145)

## 2015-03-12 LAB — PSA: PSA: 39.17 ng/mL — AB (ref 0.00–4.00)

## 2015-03-12 MED ORDER — LEUPROLIDE ACETATE (4 MONTH) 30 MG IM KIT
45.0000 mg | PACK | Freq: Once | INTRAMUSCULAR | Status: AC
  Administered 2015-03-12: 45 mg via INTRAMUSCULAR
  Filled 2015-03-12: qty 60

## 2015-03-12 MED ORDER — DENOSUMAB 120 MG/1.7ML ~~LOC~~ SOLN
120.0000 mg | Freq: Once | SUBCUTANEOUS | Status: AC
Start: 2015-03-12 — End: 2015-03-12
  Administered 2015-03-12: 120 mg via SUBCUTANEOUS
  Filled 2015-03-12: qty 1.7

## 2015-03-12 NOTE — Progress Notes (Addendum)
[place]  [phone] [phone]  ID: Steven Greene OB: [birth year]  MR#: [id number]  [id number]  Patient Care Team: Jerrol Banana., MD as PCP - General (Unknown Physician Specialty)  CHIEF COMPLAINT:  Chief Complaint  Patient presents with   Prostate Cancer    INTERVAL HISTORY: Patient returns to clinic today for further evaluation and continuation of Xgeva and Lupron. He continues to have persistent back pain, which is relieved with tylenol. He complained of some weakness and fatigue after his most recent Lupron injection but this has since resolved. He has no neurologic complaints. He denies any recent fevers. He has a good appetite and denies weight loss. He denies any chest pain or shortness of breath. He has no nausea, vomiting, constipation, or diarrhea. He has no urinary complaints. Patient offers no further specific complaints today.  REVIEW OF SYSTEMS:   Review of Systems  Constitutional: Negative for weight loss and malaise/fatigue.  Respiratory: Negative.   Cardiovascular: Negative.   Musculoskeletal: Positive for back pain.  Neurological: Negative for weakness.    As per HPI. Otherwise, a complete review of systems is negatve.  PAST MEDICAL HISTORY: Past Medical History  Diagnosis Date   Tremors of nervous system    COPD (chronic obstructive pulmonary disease)    Asthma    Arthritis    Foot drop, left    HOH (hard of hearing)    Cancer     prostate cancer-treated medically    PAST SURGICAL HISTORY: Past Surgical History  Procedure Laterality Date   Ankle fracture surgery  2009    lt   Joint replacement  2000    rt hip replacement   Hemorrhoid surgery     Eye surgery      both cataracts   Ankle fracture surgery      rt    FAMILY HISTORY: Son with T-cell lymphoma, siblings with pancreatic cancer.     ADVANCED DIRECTIVES:    HEALTH MAINTENANCE: Social History  Substance Use Topics    Smoking status: Never Smoker    Smokeless tobacco: Never Used   Alcohol Use: No     Colonoscopy:  PAP:  Bone density:  Lipid panel:  No Known Allergies  Current Outpatient Prescriptions  Medication Sig Dispense Refill   acetaminophen (TYLENOL) 500 MG tablet Take 500 mg by mouth every 6 (six) hours as needed.     Budesonide-Formoterol Fumarate (SYMBICORT IN) Inhale into the lungs.     pantoprazole (PROTONIX) 40 MG tablet Take 40 mg by mouth daily as needed.     propranolol (INDERAL) 20 MG tablet Take 20 mg by mouth 3 (three) times daily.     No current facility-administered medications for this visit.    OBJECTIVE: Filed Vitals:   03/12/15 1244  BP: 147/70  Pulse: 90  Temp: 97.5 F (36.4 C)  Resp: 18     Body mass index is 25.37 kg/(m^2).    ECOG FS:1 - Symptomatic but completely ambulatory  General: Well-developed, well-nourished, no acute distress. Eyes: anicteric sclera. Lungs: Clear to auscultation bilaterally. Heart: Regular rate and rhythm. No rubs, murmurs, or gallops. Abdomen: Soft, nontender, nondistended. No organomegaly noted, normoactive bowel sounds. Musculoskeletal: No edema, cyanosis or clubbing. Neuro: Alert, answering all questions appropriately. Cranial nerves grossly intact. Skin: No rashes or petechiae noted. Psych: Normal affect.   LAB RESULTS:  Lab Results  Component Value Date   NA 134* 03/12/2015   K 4.1 03/12/2015   CL 98* 03/12/2015  CO2 30 03/12/2015   GLUCOSE 94 03/12/2015   BUN 19 03/12/2015   CREATININE 0.99 03/12/2015   CALCIUM 9.1 03/12/2015   PROT 6.6 03/22/2014   ALBUMIN 3.3* 03/22/2014   AST 21 08/22/2014   ALT 9* 08/22/2014   ALKPHOS 107 08/22/2014   BILITOT 0.3 03/22/2014   GFRNONAA >60 03/12/2015   GFRAA >60 03/12/2015    Lab Results  Component Value Date   WBC 5.8 08/22/2014   NEUTROABS 4 08/22/2014   HGB 11.9* 08/22/2014   HCT 37* 08/22/2014   MCV 94 03/26/2014   PLT 211 08/22/2014      STUDIES: No results found.  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  PLAN:    1. Prostate cancer: Patient's PSA today 39.17 up from 26.15 on 12/04/14. Patient last received 45mg  Lupron in June 2016. Proceed with Lupron and Xgeva today. He continues to refuse monthly treatments as recommended, therefore will return to clinic in 3 months for further evaluation, recheck PSA, continuation of Xgeva, and consideration of change in therapy although patient unlikely to agree to change in therapy. 2. Anemia: Monitor, consider iron stores with next blood draw. 3. Thrombocytopenia: Resolved.  Patient expressed understanding and was in agreement with this plan. He also understands that He can call clinic at any time with any questions, concerns, or complaints.   Prostate cancer metastatic to bone    Staging form: Prostate, AJCC 7th Edition     Clinical stage from 09/24/2014: Stage IV (TX, NX, M1b, PSA: 20 or greater, Gleason X - Gleason score cannot be processed) - Signed by Lloyd Huger, MD on 09/24/2014   Steven Greene   03/12/2015 2:01 PM   Patient was seen and evaluated and I agree with the assessment and plan as above.

## 2015-03-21 ENCOUNTER — Other Ambulatory Visit: Payer: Self-pay | Admitting: Nurse Practitioner

## 2015-04-06 ENCOUNTER — Encounter: Payer: Self-pay | Admitting: Family Medicine

## 2015-04-06 ENCOUNTER — Ambulatory Visit (INDEPENDENT_AMBULATORY_CARE_PROVIDER_SITE_OTHER): Payer: Medicare Other | Admitting: Family Medicine

## 2015-04-06 ENCOUNTER — Other Ambulatory Visit: Payer: Self-pay | Admitting: Family Medicine

## 2015-04-06 VITALS — BP 118/66 | HR 68 | Temp 98.4°F | Resp 16 | Wt 181.0 lb

## 2015-04-06 DIAGNOSIS — J069 Acute upper respiratory infection, unspecified: Secondary | ICD-10-CM | POA: Diagnosis not present

## 2015-04-06 DIAGNOSIS — K219 Gastro-esophageal reflux disease without esophagitis: Secondary | ICD-10-CM | POA: Diagnosis not present

## 2015-04-06 DIAGNOSIS — J441 Chronic obstructive pulmonary disease with (acute) exacerbation: Secondary | ICD-10-CM | POA: Diagnosis not present

## 2015-04-06 MED ORDER — PANTOPRAZOLE SODIUM 40 MG PO TBEC: 40.0000 mg | DELAYED_RELEASE_TABLET | Freq: Every day | ORAL | Status: DC

## 2015-04-06 MED ORDER — AZITHROMYCIN 250 MG PO TABS: ORAL_TABLET | ORAL | Status: DC

## 2015-04-06 MED ORDER — PREDNISONE 10 MG PO TABS: ORAL_TABLET | ORAL | Status: DC

## 2015-04-06 NOTE — Progress Notes (Signed)
Patient ID: Steven Greene, male   DOB: [birth year], 82 y.o.   MRN: [id number]         Patient: Steven Greene Male    DOB: Oct 30, 1919   82 y.o.   MRN: [id number] Visit Date: 04/06/2015  Today's Provider: Margarita Rana, MD   Chief Complaint  Patient presents with  . URI  . COPD   Subjective:    URI  Associated symptoms include congestion, coughing, neck pain (Chronic Issue), rhinorrhea and wheezing. Pertinent negatives include no chest pain, ear pain, headaches, sneezing or sore throat.  Breathing Problem He complains of cough, difficulty breathing, shortness of breath and wheezing. Associated symptoms include postnasal drip and rhinorrhea. Pertinent negatives include no chest pain, ear pain, headaches, sneezing, sore throat or trouble swallowing.       No Known Allergies Previous Medications   ACETAMINOPHEN (TYLENOL) 500 MG TABLET    Take 500 mg by mouth every 6 (six) hours as needed.   BUDESONIDE-FORMOTEROL FUMARATE (SYMBICORT IN)    Inhale into the lungs. Reported on 04/06/2015   FLUTICASONE-SALMETEROL (ADVAIR DISKUS) 100-50 MCG/DOSE AEPB    Inhale 1 puff into the lungs 2 (two) times daily.   PANTOPRAZOLE (PROTONIX) 40 MG TABLET    Take 40 mg by mouth daily as needed.   PROPRANOLOL (INDERAL) 20 MG TABLET    Take 20 mg by mouth 3 (three) times daily.    Review of Systems  Constitutional: Negative.   HENT: Positive for congestion, postnasal drip and rhinorrhea. Negative for ear discharge, ear pain, sinus pressure, sneezing, sore throat, tinnitus, trouble swallowing and voice change.   Respiratory: Positive for cough, shortness of breath and wheezing. Negative for apnea, choking, chest tightness and stridor.   Cardiovascular: Negative for chest pain, palpitations and leg swelling.  Gastrointestinal: Negative.   Musculoskeletal: Positive for neck pain (Chronic Issue).  Neurological: Negative for dizziness, light-headedness and headaches.    Social History  Substance Use Topics    . Smoking status: Never Smoker   . Smokeless tobacco: Never Used  . Alcohol Use: No   Objective:   BP 118/66 mmHg  Pulse 68  Temp(Src) 98.4 F (36.9 C) (Oral)  Resp 16  Wt 181 lb (82.101 kg)  Physical Exam  Constitutional: He is oriented to person, place, and time. He appears well-developed and well-nourished.  HENT:  Head: Normocephalic and atraumatic.  Right Ear: Tympanic membrane, external ear and ear canal normal.  Left Ear: Tympanic membrane, external ear and ear canal normal.  Nose: Nose normal.  Mouth/Throat: Uvula is midline, oropharynx is clear and moist and mucous membranes are normal.  Cardiovascular: Normal rate, regular rhythm and normal heart sounds.   Pulmonary/Chest: Effort normal. He has decreased breath sounds. He has wheezes.  Prolonged expiratory phase.   Neurological: He is alert and oriented to person, place, and time.  Psychiatric: He has a normal mood and affect. His speech is normal and behavior is normal. Judgment and thought content normal. Cognition and memory are normal.       Assessment & Plan:      1. COPD exacerbation ([place]) New problem. Will start medication. Patient instructed to call back if condition worsens or does not improve.    - predniSONE (DELTASONE) 10 MG tablet; Take 6 tablets a day for one day; Take 5 tablets a day for one day; Take 4 tablets a day for one day; Take 3 tablets a day for one day; Take 2 tablets for one day; Take 1 tablet  for one day.  Dispense: 21 tablet; Refill: 0 - azithromycin (ZITHROMAX Z-PAK) 250 MG tablet; Take two tablets for one day; then one everyday until gone.  Dispense: 6 each; Refill: 0  2. Upper respiratory infection Has caused COPD exacerbation.   - predniSONE (DELTASONE) 10 MG tablet; Take 6 tablets a day for one day; Take 5 tablets a day for one day; Take 4 tablets a day for one day; Take 3 tablets a day for one day; Take 2 tablets for one day; Take 1 tablet for one day.  Dispense: 21 tablet; Refill:  0 - azithromycin (ZITHROMAX Z-PAK) 250 MG tablet; Take two tablets for one day; then one everyday until gone.  Dispense: 6 each; Refill: 0  3. Gastroesophageal reflux disease, esophagitis presence not specified Has history of bleed from esophagitis.  Stressed importance of restarting Protonix while on Prednisone.   - pantoprazole (PROTONIX) 40 MG tablet; Take 1 tablet (40 mg total) by mouth daily.  Dispense: 30 tablet; Refill: 1     Patient was seen and examined by Jerrell Belfast, MD, and note scribed by Ashley Royalty, CMA.  I have reviewed the document for accuracy and completeness and I agree with above. - Jerrell Belfast, MD   Margarita Rana, MD  [facility]

## 2015-04-12 ENCOUNTER — Ambulatory Visit (INDEPENDENT_AMBULATORY_CARE_PROVIDER_SITE_OTHER): Payer: Medicare Other | Admitting: Family Medicine

## 2015-04-12 ENCOUNTER — Ambulatory Visit
Admission: RE | Admit: 2015-04-12 | Discharge: 2015-04-12 | Disposition: A | Discharge status: Home / Self Care | Payer: Medicare Other | Source: Ambulatory Visit | Attending: Family Medicine | Admitting: Family Medicine

## 2015-04-12 VITALS — BP 152/78 | HR 70 | Temp 97.5°F | Resp 18 | Wt 189.0 lb

## 2015-04-12 DIAGNOSIS — R062 Wheezing: Secondary | ICD-10-CM | POA: Insufficient documentation

## 2015-04-12 DIAGNOSIS — R0989 Other specified symptoms and signs involving the circulatory and respiratory systems: Secondary | ICD-10-CM | POA: Insufficient documentation

## 2015-04-12 DIAGNOSIS — J069 Acute upper respiratory infection, unspecified: Secondary | ICD-10-CM | POA: Diagnosis not present

## 2015-04-12 DIAGNOSIS — J441 Chronic obstructive pulmonary disease with (acute) exacerbation: Secondary | ICD-10-CM

## 2015-04-12 DIAGNOSIS — R05 Cough: Secondary | ICD-10-CM | POA: Diagnosis not present

## 2015-04-12 DIAGNOSIS — R059 Cough, unspecified: Secondary | ICD-10-CM

## 2015-04-12 MED ORDER — PREDNISONE 10 MG PO TABS: ORAL_TABLET | ORAL | Status: DC

## 2015-04-12 MED ORDER — GUAIFENESIN-DM 100-10 MG/5ML PO SYRP: 5.0000 mL | ORAL_SOLUTION | ORAL | Status: DC | PRN

## 2015-04-12 MED ORDER — DOXYCYCLINE HYCLATE 100 MG PO TABS: 100.0000 mg | ORAL_TABLET | Freq: Two times a day (BID) | ORAL | Status: DC

## 2015-04-12 MED ORDER — PREDNISONE 10 MG (21) PO TBPK: 10.0000 mg | ORAL_TABLET | ORAL | Status: DC

## 2015-04-12 NOTE — Progress Notes (Signed)
Patient ID: Steven Greene, male   DOB: [birth year], 82 y.o.   MRN: [id number]   Steven Greene  MRN: [id number] DOB: 06-04-1919  Subjective:  HPI  1. Upper respiratory infection The patient is a 82 year old male who presents today for evaluation of his upper respiratory infection.  He was seen on 04/06/15 by Dr. Venia Minks.  At that time he was started on ZPak, Prednisone 6 day taper and Protonix.  He states that when he first started taking the medication he felt like he was beginning to get better.  However since the day he finished the antibiotic he feels he is now worse than he was before in that he is coughing more and his wheezing is worse.  His cough is still sometimes productive but is no longer yellow.  He also stated that at time he has coughed so hard it has caused him to have a bowel movement.  He was having trouble at night sleeping from the cough but is sleeping well now.  He states he does not feel sick and if not for the cough and wheezing he would be back to his normal self.  He has no increased coughing with walking and denies any DOE.  Patient Active Problem List   Diagnosis Date Noted  . Hypertension 12/08/2014  . Chronic obstructive airway disease ([place]) 12/08/2014  . Asthma 12/08/2014  . GERD (gastroesophageal reflux disease) 12/08/2014  . Osteoarthritis 12/08/2014  . Chronic back pain 12/08/2014  . BPH (benign prostatic hypertrophy) 12/08/2014  . Tremor 12/08/2014  . Prostate cancer metastatic to bone [facility]) 08/31/2014    Past Medical History  Diagnosis Date  . Tremors of nervous system   . COPD (chronic obstructive pulmonary disease) ([place])   . Asthma   . Arthritis   . Foot drop, left   . HOH (hard of hearing)   . Cancer [facility])     prostate cancer-treated medically    Social History   Social History  . Marital Status: Married    Spouse Name: N/A  . Number of Children: N/A  . Years of Education: N/A   Occupational History  . Not on file.   Social History Main  Topics  . Smoking status: Never Smoker   . Smokeless tobacco: Never Used  . Alcohol Use: No  . Drug Use: No  . Sexual Activity: Not on file   Other Topics Concern  . Not on file   Social History Narrative    Outpatient Prescriptions Prior to Visit  Medication Sig Dispense Refill  . acetaminophen (TYLENOL) 500 MG tablet Take 500 mg by mouth every 6 (six) hours as needed.    . Budesonide-Formoterol Fumarate (SYMBICORT IN) Inhale into the lungs. Reported on 04/06/2015    . Fluticasone-Salmeterol (ADVAIR DISKUS) 100-50 MCG/DOSE AEPB Inhale 1 puff into the lungs 2 (two) times daily.    . pantoprazole (PROTONIX) 40 MG tablet Take 1 tablet (40 mg total) by mouth daily. 30 tablet 1  . propranolol (INDERAL) 20 MG tablet Take 20 mg by mouth 3 (three) times daily.    Marland Kitchen azithromycin (ZITHROMAX Z-PAK) 250 MG tablet Take two tablets for one day; then one everyday until gone. 6 each 0  . predniSONE (DELTASONE) 10 MG tablet Take 6 tablets a day for one day; Take 5 tablets a day for one day; Take 4 tablets a day for one day; Take 3 tablets a day for one day; Take 2 tablets for one day; Take 1 tablet  for one day. 21 tablet 0   No facility-administered medications prior to visit.    No Known Allergies  Review of Systems  Constitutional: Negative for fever, chills and malaise/fatigue.  HENT: Positive for hearing loss (chronic and unchanged). Negative for congestion, ear discharge, ear pain, nosebleeds, sore throat and tinnitus.   Eyes: Negative for blurred vision, double vision, photophobia, pain, discharge and redness.  Respiratory: Positive for cough, sputum production and wheezing. Negative for shortness of breath (He only gets short of breath after coughing bad) and stridor.   Cardiovascular: Negative for chest pain, palpitations, orthopnea and leg swelling.  Neurological: Negative for weakness and headaches.   Objective:  BP 152/78 mmHg  Pulse 70  Temp(Src) 97.5 F (36.4 C) (Oral)  Resp 18   Wt 189 lb (85.73 kg)  SpO2 98%  Physical Exam  Constitutional: He is oriented to person, place, and time and well-developed, well-nourished, and in no distress.  HENT:  Head: Normocephalic and atraumatic.  Eyes: Pupils are equal, round, and reactive to light.  Neck: Normal range of motion. Neck supple.  Cardiovascular: Normal rate, regular rhythm and normal heart sounds.   Pulmonary/Chest: Effort normal and breath sounds normal.  Left lower lobe rhonchi  Abdominal: Soft.  Neurological: He is alert and oriented to person, place, and time.  Skin: Skin is warm and dry.  Psychiatric: Mood, memory, affect and judgment normal.    Assessment and Plan :   1. Upper respiratory infection  - predniSONE (DELTASONE) 10 MG tablet; Take 6 tablets a day for one day; Take 5 tablets a day for one day; Take 4 tablets a day for one day; Take 3 tablets a day for one day; Take 2 tablets for one day; Take 1 tablet for one day.  Dispense: 21 tablet; Refill: 0  2. Rhonchi  - DG Chest 2 View; Future - doxycycline (VIBRA-TABS) 100 MG tablet; Take 1 tablet (100 mg total) by mouth 2 (two) times daily.  Dispense: 20 tablet; Refill: 0  3. Wheezing  - DG Chest 2 View; Future - doxycycline (VIBRA-TABS) 100 MG tablet; Take 1 tablet (100 mg total) by mouth 2 (two) times daily.  Dispense: 20 tablet; Refill: 0  4. COPD exacerbation ([facility])  - predniSONE (DELTASONE) 10 MG tablet; Take 6 tablets a day for one day; Take 5 tablets a day for one day; Take 4 tablets a day for one day; Take 3 tablets a day for one day; Take 2 tablets for one day; Take 1 tablet for one day.  Dispense: 21 tablet; Refill: 0  5. Cough  - guaiFENesin-dextromethorphan (ROBITUSSIN DM) 100-10 MG/5ML syrup; Take 5 mLs by mouth every 4 (four) hours as needed for cough.  Dispense: 118 mL; Refill: 0  I have done the exam and reviewed the above chart and it is accurate to the best of my knowledge.  Miguel Aschoff MD [facility] 04/12/2015 10:30 AM

## 2015-04-12 NOTE — Telephone Encounter (Signed)
Patient thinks he is getting worse, have instructed him to come in and be seen.

## 2015-06-04 ENCOUNTER — Other Ambulatory Visit: Payer: Self-pay | Admitting: Family Medicine

## 2015-06-04 ENCOUNTER — Ambulatory Visit (INDEPENDENT_AMBULATORY_CARE_PROVIDER_SITE_OTHER): Payer: Medicare Other | Admitting: Family Medicine

## 2015-06-04 VITALS — BP 160/80 | HR 98 | Temp 97.6°F | Resp 16 | Wt 186.0 lb

## 2015-06-04 DIAGNOSIS — R112 Nausea with vomiting, unspecified: Secondary | ICD-10-CM

## 2015-06-04 DIAGNOSIS — R197 Diarrhea, unspecified: Secondary | ICD-10-CM | POA: Diagnosis not present

## 2015-06-04 DIAGNOSIS — K219 Gastro-esophageal reflux disease without esophagitis: Secondary | ICD-10-CM

## 2015-06-04 MED ORDER — PROPRANOLOL HCL 20 MG PO TABS: 20.0000 mg | ORAL_TABLET | Freq: Every day | ORAL | Status: DC

## 2015-06-04 NOTE — Progress Notes (Signed)
Patient ID: Steven Greene, male   DOB: 02-19-1920, 82 y.o.   MRN: [id number]   Steven Greene  MRN: [id number] DOB: 07/16/1919  Subjective:  HPI   1. Gastroesophageal reflux disease, esophagitis presence not specified Patient is in need of refill for his Pantoprazole  2. Non-intractable vomiting with nausea, vomiting of unspecified type The patient is a 82 year old male who presents with vomiting and diarrhea since last night about midnight.  He said that he has history of diverticulosis and ate popcorn and vegetable soup yesterday.  He states that it has never bothered his diverticulosis before.  The care giver with him today states he was still having symptoms at 9 am today but has been improving and has been able to keep some things down.  3. Diarrhea, unspecified type  The patient also asked if his leg weakness is coming from the bone cancer and when he rolls over it hurts in his ribs. 4. Pain in ribs Think the pain in the. Patient is having this from his metastatic prostate cancer. Says he does not need any treatment for the pain. Patient Active Problem List   Diagnosis Date Noted  . Hypertension 12/08/2014  . Chronic obstructive airway disease ([place]) 12/08/2014  . Asthma 12/08/2014  . GERD (gastroesophageal reflux disease) 12/08/2014  . Osteoarthritis 12/08/2014  . Chronic back pain 12/08/2014  . BPH (benign prostatic hypertrophy) 12/08/2014  . Tremor 12/08/2014  . Prostate cancer metastatic to bone [facility]) 08/31/2014    Past Medical History  Diagnosis Date  . Tremors of nervous system   . COPD (chronic obstructive pulmonary disease) ([place])   . Asthma   . Arthritis   . Foot drop, left   . HOH (hard of hearing)   . Cancer [facility])     prostate cancer-treated medically    Social History   Social History  . Marital Status: Married    Spouse Name: N/A  . Number of Children: N/A  . Years of Education: N/A   Occupational History  . Not on file.   Social History Main Topics    . Smoking status: Never Smoker   . Smokeless tobacco: Never Used  . Alcohol Use: No  . Drug Use: No  . Sexual Activity: Not on file   Other Topics Concern  . Not on file   Social History Narrative    Outpatient Prescriptions Prior to Visit  Medication Sig Dispense Refill  . acetaminophen (TYLENOL) 500 MG tablet Take 500 mg by mouth every 6 (six) hours as needed.    . Fluticasone-Salmeterol (ADVAIR DISKUS) 100-50 MCG/DOSE AEPB Inhale 1 puff into the lungs 2 (two) times daily.    . pantoprazole (PROTONIX) 40 MG tablet Take 1 tablet (40 mg total) by mouth daily. 30 tablet 1  . propranolol (INDERAL) 20 MG tablet TAKE ONE TABLET BY MOUTH ONCE DAILY 90 tablet 0  . Budesonide-Formoterol Fumarate (SYMBICORT IN) Inhale into the lungs. Reported on 06/04/2015    . doxycycline (VIBRA-TABS) 100 MG tablet Take 1 tablet (100 mg total) by mouth 2 (two) times daily. (Patient not taking: Reported on 06/04/2015) 20 tablet 0  . guaiFENesin-dextromethorphan (ROBITUSSIN DM) 100-10 MG/5ML syrup Take 5 mLs by mouth every 4 (four) hours as needed for cough. (Patient not taking: Reported on 06/04/2015) 118 mL 0  . predniSONE (DELTASONE) 10 MG tablet Take 6 tablets a day for one day; Take 5 tablets a day for one day; Take 4 tablets a day for one day; Take  3 tablets a day for one day; Take 2 tablets for one day; Take 1 tablet for one day. (Patient not taking: Reported on 06/04/2015) 21 tablet 0  . predniSONE (STERAPRED UNI-PAK 21 TAB) 10 MG (21) TBPK tablet Take 1 tablet (10 mg total) by mouth See admin instructions. 6 po day 1, 5 po day 2, 4 po day 3, 3 po day 4, 2 po day 5, 1 po day 6 (Patient not taking: Reported on 06/04/2015) 21 tablet 0   No facility-administered medications prior to visit.   Outpatient Encounter Prescriptions as of 06/04/2015  Medication Sig  . acetaminophen (TYLENOL) 500 MG tablet Take 500 mg by mouth every 6 (six) hours as needed.  . Fluticasone-Salmeterol (ADVAIR DISKUS) 100-50 MCG/DOSE AEPB  Inhale 1 puff into the lungs 2 (two) times daily.  . pantoprazole (PROTONIX) 40 MG tablet Take 1 tablet (40 mg total) by mouth daily.  . propranolol (INDERAL) 20 MG tablet Take 1 tablet (20 mg total) by mouth daily.  . [DISCONTINUED] propranolol (INDERAL) 20 MG tablet TAKE ONE TABLET BY MOUTH ONCE DAILY  . guaiFENesin-dextromethorphan (ROBITUSSIN DM) 100-10 MG/5ML syrup Take 5 mLs by mouth every 4 (four) hours as needed for cough. (Patient not taking: Reported on 06/04/2015)  . [DISCONTINUED] Budesonide-Formoterol Fumarate (SYMBICORT IN) Inhale into the lungs. Reported on 06/04/2015  . [DISCONTINUED] doxycycline (VIBRA-TABS) 100 MG tablet Take 1 tablet (100 mg total) by mouth 2 (two) times daily. (Patient not taking: Reported on 06/04/2015)  . [DISCONTINUED] predniSONE (DELTASONE) 10 MG tablet Take 6 tablets a day for one day; Take 5 tablets a day for one day; Take 4 tablets a day for one day; Take 3 tablets a day for one day; Take 2 tablets for one day; Take 1 tablet for one day. (Patient not taking: Reported on 06/04/2015)  . [DISCONTINUED] predniSONE (STERAPRED UNI-PAK 21 TAB) 10 MG (21) TBPK tablet Take 1 tablet (10 mg total) by mouth See admin instructions. 6 po day 1, 5 po day 2, 4 po day 3, 3 po day 4, 2 po day 5, 1 po day 6 (Patient not taking: Reported on 06/04/2015)   No facility-administered encounter medications on file as of 06/04/2015.    No Known Allergies  Review of Systems  Constitutional: Negative for fever, chills and malaise/fatigue.  Eyes: Negative.   Respiratory: Negative for cough, hemoptysis, sputum production, shortness of breath and wheezing.   Cardiovascular: Negative for chest pain, palpitations, orthopnea and leg swelling.  Gastrointestinal: Positive for vomiting, abdominal pain and diarrhea. Negative for heartburn, nausea, constipation, blood in stool and melena.       Symptoms have all improved since this morning  Musculoskeletal: Positive for neck pain.  Neurological:  Negative for dizziness, weakness and headaches.  Endo/Heme/Allergies: Negative.   Psychiatric/Behavioral: Negative.    Objective:  BP 160/80 mmHg  Pulse 98  Temp(Src) 97.6 F (36.4 C) (Oral)  Resp 16  Wt 186 lb (84.369 kg)  Physical Exam  Constitutional: He is oriented to person, place, and time and well-developed, well-nourished, and in no distress.  HENT:  Head: Normocephalic and atraumatic.  Right Ear: External ear normal.  Left Ear: External ear normal.  Nose: Nose normal.  Eyes: Conjunctivae are normal.  Neck: Neck supple.  Cardiovascular: Normal rate, regular rhythm and normal heart sounds.   Pulmonary/Chest: Effort normal and breath sounds normal.  Abdominal: Soft.  Neurological: He is alert and oriented to person, place, and time.  Skin: Skin is warm and dry.  Psychiatric:  Mood, memory, affect and judgment normal.    Assessment and Plan :   1. Gastroesophageal reflux disease, esophagitis presence not specified   2. Non-intractable vomiting with nausea, vomiting of unspecified type Gastroenteritis, resolving. It is 90% better than it was.  3. Diarrhea, unspecified type Resolving. 4. metastatic prostate cancer to bone He declines. Treatment for the pain for his prostate cancer bony metastases.More than 50% of time is spent in counseling regarding the overall situation. 5. Mild asthma I have done the exam and reviewed the above chart and it is accurate to the best of my knowledge.  Miguel Aschoff MD [facility] 06/04/2015 3:10 PM

## 2015-06-12 ENCOUNTER — Inpatient Hospital Stay: Payer: Medicare Other | Admitting: Oncology

## 2015-06-12 ENCOUNTER — Inpatient Hospital Stay: Payer: Medicare Other

## 2015-07-02 ENCOUNTER — Ambulatory Visit: Payer: Medicare Other

## 2015-07-02 ENCOUNTER — Inpatient Hospital Stay: Payer: Medicare Other

## 2015-07-02 ENCOUNTER — Ambulatory Visit: Payer: Medicare Other | Admitting: Oncology

## 2015-07-02 ENCOUNTER — Inpatient Hospital Stay: Payer: Medicare Other | Attending: Oncology

## 2015-07-02 ENCOUNTER — Inpatient Hospital Stay (HOSPITAL_BASED_OUTPATIENT_CLINIC_OR_DEPARTMENT_OTHER): Payer: Medicare Other | Admitting: Oncology

## 2015-07-02 ENCOUNTER — Other Ambulatory Visit: Payer: Medicare Other

## 2015-07-02 DIAGNOSIS — M542 Cervicalgia: Secondary | ICD-10-CM

## 2015-07-02 DIAGNOSIS — Z807 Family history of other malignant neoplasms of lymphoid, hematopoietic and related tissues: Secondary | ICD-10-CM | POA: Insufficient documentation

## 2015-07-02 DIAGNOSIS — M549 Dorsalgia, unspecified: Secondary | ICD-10-CM

## 2015-07-02 DIAGNOSIS — M129 Arthropathy, unspecified: Secondary | ICD-10-CM | POA: Insufficient documentation

## 2015-07-02 DIAGNOSIS — J45909 Unspecified asthma, uncomplicated: Secondary | ICD-10-CM | POA: Insufficient documentation

## 2015-07-02 DIAGNOSIS — R251 Tremor, unspecified: Secondary | ICD-10-CM | POA: Diagnosis not present

## 2015-07-02 DIAGNOSIS — M21372 Foot drop, left foot: Secondary | ICD-10-CM | POA: Diagnosis not present

## 2015-07-02 DIAGNOSIS — C7951 Secondary malignant neoplasm of bone: Secondary | ICD-10-CM

## 2015-07-02 DIAGNOSIS — H9190 Unspecified hearing loss, unspecified ear: Secondary | ICD-10-CM | POA: Insufficient documentation

## 2015-07-02 DIAGNOSIS — Z79899 Other long term (current) drug therapy: Secondary | ICD-10-CM | POA: Insufficient documentation

## 2015-07-02 DIAGNOSIS — C61 Malignant neoplasm of prostate: Secondary | ICD-10-CM

## 2015-07-02 DIAGNOSIS — M8929 Other disorders of bone development and growth, multiple sites: Secondary | ICD-10-CM

## 2015-07-02 DIAGNOSIS — J449 Chronic obstructive pulmonary disease, unspecified: Secondary | ICD-10-CM

## 2015-07-02 DIAGNOSIS — Z800 Family history of malignant neoplasm of digestive organs: Secondary | ICD-10-CM | POA: Diagnosis not present

## 2015-07-02 DIAGNOSIS — D649 Anemia, unspecified: Secondary | ICD-10-CM | POA: Insufficient documentation

## 2015-07-02 LAB — BASIC METABOLIC PANEL
ANION GAP: 3 — AB (ref 5–15)
BUN: 24 mg/dL — ABNORMAL HIGH (ref 6–20)
CALCIUM: 8.8 mg/dL — AB (ref 8.9–10.3)
CO2: 27 mmol/L (ref 22–32)
Chloride: 103 mmol/L (ref 101–111)
Creatinine, Ser: 1.07 mg/dL (ref 0.61–1.24)
GFR calc Af Amer: >60 mL/min (ref >60)
GFR calc non Af Amer: 57 mL/min — ABNORMAL LOW (ref >60)
GLUCOSE: 105 mg/dL — AB (ref 65–99)
Potassium: 4.6 mmol/L (ref 3.5–5.1)
Sodium: 133 mmol/L — ABNORMAL LOW (ref 135–145)

## 2015-07-02 LAB — PSA: PSA: 38.79 ng/mL — ABNORMAL HIGH (ref 0.00–4.00)

## 2015-07-02 MED ORDER — DENOSUMAB 120 MG/1.7ML ~~LOC~~ SOLN
120.0000 mg | Freq: Once | SUBCUTANEOUS | Status: AC
  Administered 2015-07-02: 120 mg via SUBCUTANEOUS
  Filled 2015-07-02: qty 1.7

## 2015-07-02 NOTE — Progress Notes (Signed)
[place]  Telephone:(336) [phone] Fax:(336) [phone]  ID: Steven Greene OB: [birth year]  MR#: [id number]  [id number]  Patient Care Team: Jerrol Banana., MD as PCP - General (Unknown Physician Specialty)  CHIEF COMPLAINT: Metastatic prostate cancer.  INTERVAL HISTORY: Patient returns to clinic today for further evaluation and continuation of Xgeva. He continues to have chronic back and neck pain, which is relieved with tylenol. He complained of some weakness and fatigue after his most recent Lupron injection but this has since resolved. He has no neurologic complaints. He denies any recent fevers. He has a good appetite and denies weight loss. He denies any chest pain or shortness of breath. He has no nausea, vomiting, constipation, or diarrhea. He has no urinary complaints. Patient offers no further specific complaints today.  REVIEW OF SYSTEMS:   Review of Systems  Constitutional: Negative for weight loss and malaise/fatigue.  Respiratory: Negative.   Cardiovascular: Negative.   Musculoskeletal: Positive for back pain.  Neurological: Negative for weakness.    As per HPI. Otherwise, a complete review of systems is negatve.  PAST MEDICAL HISTORY: Past Medical History  Diagnosis Date  . Tremors of nervous system   . COPD (chronic obstructive pulmonary disease) ([place])   . Asthma   . Arthritis   . Foot drop, left   . HOH (hard of hearing)   . Cancer [facility])     prostate cancer-treated medically    PAST SURGICAL HISTORY: Past Surgical History  Procedure Laterality Date  . Ankle fracture surgery  2009    lt  . Joint replacement  2000    rt hip replacement  . Hemorrhoid surgery    . Eye surgery      both cataracts  . Ankle fracture surgery      rt    FAMILY HISTORY: Son with T-cell lymphoma, siblings with pancreatic cancer.     ADVANCED DIRECTIVES:    HEALTH MAINTENANCE: Social History  Substance Use Topics  . Smoking status: Never  Smoker   . Smokeless tobacco: Never Used  . Alcohol Use: No     Colonoscopy:  PAP:  Bone density:  Lipid panel:  No Known Allergies  Current Outpatient Prescriptions  Medication Sig Dispense Refill  . acetaminophen (TYLENOL) 500 MG tablet Take 500 mg by mouth every 6 (six) hours as needed.    . Fluticasone-Salmeterol (ADVAIR DISKUS) 100-50 MCG/DOSE AEPB Inhale 1 puff into the lungs 2 (two) times daily.    Marland Kitchen guaiFENesin-dextromethorphan (ROBITUSSIN DM) 100-10 MG/5ML syrup Take 5 mLs by mouth every 4 (four) hours as needed for cough. (Patient not taking: Reported on 06/04/2015) 118 mL 0  . pantoprazole (PROTONIX) 40 MG tablet Take 1 tablet (40 mg total) by mouth daily. 30 tablet 1  . propranolol (INDERAL) 20 MG tablet Take 1 tablet (20 mg total) by mouth daily. 90 tablet 3   No current facility-administered medications for this visit.    OBJECTIVE: There were no vitals filed for this visit.   There is no weight on file to calculate BMI.    ECOG FS:1 - Symptomatic but completely ambulatory  General: Well-developed, well-nourished, no acute distress. Eyes: anicteric sclera. Lungs: Clear to auscultation bilaterally. Heart: Regular rate and rhythm. No rubs, murmurs, or gallops. Abdomen: Soft, nontender, nondistended. No organomegaly noted, normoactive bowel sounds. Musculoskeletal: No edema, cyanosis or clubbing. Neuro: Alert, answering all questions appropriately. Cranial nerves grossly intact. Skin: No rashes or petechiae noted. Psych: Normal affect.   LAB RESULTS:  Lab Results  Component Value Date   NA 133* 07/02/2015   K 4.6 07/02/2015   CL 103 07/02/2015   CO2 27 07/02/2015   GLUCOSE 105* 07/02/2015   BUN 24* 07/02/2015   CREATININE 1.07 07/02/2015   CALCIUM 8.8* 07/02/2015   PROT 6.6 03/22/2014   ALBUMIN 3.3* 03/22/2014   AST 21 08/22/2014   ALT 9* 08/22/2014   ALKPHOS 107 08/22/2014   BILITOT 0.3 03/22/2014   GFRNONAA 57* 07/02/2015   GFRAA >60 07/02/2015     Lab Results  Component Value Date   WBC 5.8 08/22/2014   NEUTROABS 4 08/22/2014   HGB 11.9* 08/22/2014   HCT 37* 08/22/2014   MCV 94 03/26/2014   PLT 211 08/22/2014   Lab Results  Component Value Date   PSA 39.17* 03/12/2015     STUDIES: No results found.  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  PLAN:    1. Prostate cancer: Proceed with Xgeva only today. Patient's most recent PSA was 39.17, today's result is pending. Patient last received 45mg  Lupron in June 2016. He continues to refuse monthly treatments as recommended, therefore will return to clinic in 3 months for further evaluation, recheck PSA, continuation of Xgeva and Lupron. 2. Anemia: Monitor, consider iron stores with next blood draw. 3. Thrombocytopenia: Resolved.  Patient expressed understanding and was in agreement with this plan. He also understands that He can call clinic at any time with any questions, concerns, or complaints.   Prostate cancer metastatic to bone    Staging form: Prostate, AJCC 7th Edition     Clinical stage from 09/24/2014: Stage IV (TX, NX, M1b, PSA: 20 or greater, Gleason X - Gleason score cannot be processed) - Signed by Lloyd Huger, MD on 09/24/2014   Lloyd Huger, MD   07/02/2015 3:25 PM

## 2015-07-18 ENCOUNTER — Telehealth: Payer: Self-pay | Admitting: Family Medicine

## 2015-07-18 NOTE — Telephone Encounter (Signed)
Pt called me last night at home.  He wanted to know if I could bring him an Advair sample. He was given one in January and needs another one.

## 2015-07-18 NOTE — Telephone Encounter (Signed)
If we have 1.

## 2015-09-21 ENCOUNTER — Other Ambulatory Visit: Payer: Self-pay | Admitting: Nurse Practitioner

## 2015-10-01 ENCOUNTER — Inpatient Hospital Stay: Payer: Medicare Other | Attending: Oncology | Admitting: Oncology

## 2015-10-01 ENCOUNTER — Inpatient Hospital Stay: Payer: Medicare Other

## 2015-10-01 VITALS — BP 154/80 | HR 72 | Wt 179.0 lb

## 2015-10-01 DIAGNOSIS — Z79818 Long term (current) use of other agents affecting estrogen receptors and estrogen levels: Secondary | ICD-10-CM | POA: Diagnosis not present

## 2015-10-01 DIAGNOSIS — C7951 Secondary malignant neoplasm of bone: Principal | ICD-10-CM

## 2015-10-01 DIAGNOSIS — C61 Malignant neoplasm of prostate: Secondary | ICD-10-CM

## 2015-10-01 DIAGNOSIS — M129 Arthropathy, unspecified: Secondary | ICD-10-CM | POA: Insufficient documentation

## 2015-10-01 DIAGNOSIS — Z79899 Other long term (current) drug therapy: Secondary | ICD-10-CM

## 2015-10-01 DIAGNOSIS — J45909 Unspecified asthma, uncomplicated: Secondary | ICD-10-CM | POA: Diagnosis not present

## 2015-10-01 DIAGNOSIS — M21372 Foot drop, left foot: Secondary | ICD-10-CM | POA: Diagnosis not present

## 2015-10-01 DIAGNOSIS — G8929 Other chronic pain: Secondary | ICD-10-CM | POA: Insufficient documentation

## 2015-10-01 DIAGNOSIS — D649 Anemia, unspecified: Secondary | ICD-10-CM | POA: Diagnosis not present

## 2015-10-01 DIAGNOSIS — M542 Cervicalgia: Secondary | ICD-10-CM | POA: Diagnosis not present

## 2015-10-01 DIAGNOSIS — J449 Chronic obstructive pulmonary disease, unspecified: Secondary | ICD-10-CM | POA: Diagnosis not present

## 2015-10-01 DIAGNOSIS — M549 Dorsalgia, unspecified: Secondary | ICD-10-CM | POA: Diagnosis not present

## 2015-10-01 LAB — BASIC METABOLIC PANEL
ANION GAP: 5 (ref 5–15)
BUN: 16 mg/dL (ref 6–20)
CALCIUM: 8.9 mg/dL (ref 8.9–10.3)
CO2: 26 mmol/L (ref 22–32)
Chloride: 105 mmol/L (ref 101–111)
Creatinine, Ser: 0.85 mg/dL (ref 0.61–1.24)
GFR calc Af Amer: >60 mL/min (ref >60)
GLUCOSE: 108 mg/dL — AB (ref 65–99)
Potassium: 4.6 mmol/L (ref 3.5–5.1)
SODIUM: 136 mmol/L (ref 135–145)

## 2015-10-01 LAB — PSA: PSA: 39.38 ng/mL — AB (ref 0.00–4.00)

## 2015-10-01 MED ORDER — LEUPROLIDE ACETATE (4 MONTH) 30 MG IM KIT: 45.0000 mg | PACK | Freq: Once | INTRAMUSCULAR | Status: DC

## 2015-10-01 MED ORDER — DENOSUMAB 120 MG/1.7ML ~~LOC~~ SOLN
120.0000 mg | Freq: Once | SUBCUTANEOUS | Status: AC
  Administered 2015-10-01: 120 mg via SUBCUTANEOUS
  Filled 2015-10-01: qty 1.7

## 2015-10-01 MED ORDER — LEUPROLIDE ACETATE (6 MONTH) 45 MG IM KIT
45.0000 mg | PACK | Freq: Once | INTRAMUSCULAR | Status: AC
  Administered 2015-10-01: 45 mg via INTRAMUSCULAR
  Filled 2015-10-01: qty 45

## 2015-10-01 NOTE — Progress Notes (Signed)
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  INTERVAL HISTORY: Patient returns to clinic today for further evaluation and continuation of Xgeva and Lupron. He continues to have chronic back and neck pain, which is relieved with tylenol. He continues to have mild weakness and fatigue, but is chronic and unchanged. He has no neurologic complaints. He denies any recent fevers. He has a good appetite and denies weight loss. He denies any chest pain or shortness of breath. He has no nausea, vomiting, constipation, or diarrhea. He has no urinary complaints. Patient offers no further specific complaints today.  REVIEW OF SYSTEMS:   Review of Systems  Constitutional: Negative for weight loss and malaise/fatigue.  Respiratory: Negative.   Cardiovascular: Negative.   Musculoskeletal: Positive for back pain.  Neurological: Negative for weakness.    As per HPI. Otherwise, a complete review of systems is negatve.  PAST MEDICAL HISTORY: Past Medical History  Diagnosis Date  . Tremors of nervous system   . COPD (chronic obstructive pulmonary disease) ([place])   . Asthma   . Arthritis   . Foot drop, left   . HOH (hard of hearing)   . Cancer [facility])     prostate cancer-treated medically    PAST SURGICAL HISTORY: Past Surgical History  Procedure Laterality Date  . Ankle fracture surgery  2009    lt  . Joint replacement  2000    rt hip replacement  . Hemorrhoid surgery    . Eye surgery      both cataracts  . Ankle fracture surgery      rt    FAMILY HISTORY: Son with T-cell lymphoma, siblings with pancreatic cancer.     ADVANCED DIRECTIVES:    HEALTH MAINTENANCE: Social History  Substance Use Topics  . Smoking  status: Never Smoker   . Smokeless tobacco: Never Used  . Alcohol Use: No     Colonoscopy:  PAP:  Bone density:  Lipid panel:  No Known Allergies  Current Outpatient Prescriptions  Medication Sig Dispense Refill  . acetaminophen (TYLENOL) 500 MG tablet Take 500 mg by mouth every 6 (six) hours as needed.    . Fluticasone-Salmeterol (ADVAIR DISKUS) 100-50 MCG/DOSE AEPB Inhale 1 puff into the lungs 2 (two) times daily.    Marland Kitchen guaiFENesin-dextromethorphan (ROBITUSSIN DM) 100-10 MG/5ML syrup Take 5 mLs by mouth every 4 (four) hours as needed for cough. 118 mL 0  . pantoprazole (PROTONIX) 40 MG tablet Take 1 tablet (40 mg total) by mouth daily. 30 tablet 1  . propranolol (INDERAL) 20 MG tablet Take 1 tablet (20 mg total) by mouth daily. 90 tablet 3   No current facility-administered medications for this visit.    OBJECTIVE: Filed Vitals:   10/01/15 1457  BP: 154/80  Pulse: 72     Body mass index is 25.69 kg/(m^2).    ECOG FS:1 - Symptomatic but completely ambulatory  General: Well-developed, well-nourished, no acute distress. Eyes: anicteric sclera. Lungs: Clear to auscultation bilaterally. Heart: Regular rate and rhythm. No rubs, murmurs, or gallops. Abdomen: Soft, nontender, nondistended. No organomegaly noted, normoactive bowel sounds. Musculoskeletal: No edema, cyanosis or clubbing. Neuro: Alert, answering all questions appropriately. Cranial nerves grossly intact. Skin: No rashes or petechiae noted. Psych: Normal affect.   LAB RESULTS:  Lab  Results  Component Value Date   NA 136 10/01/2015   K 4.6 10/01/2015   CL 105 10/01/2015   CO2 26 10/01/2015   GLUCOSE 108* 10/01/2015   BUN 16 10/01/2015   CREATININE 0.85 10/01/2015   CALCIUM 8.9 10/01/2015   PROT 6.6 03/22/2014   ALBUMIN 3.3* 03/22/2014   AST 21 08/22/2014   ALT 9* 08/22/2014   ALKPHOS 107 08/22/2014   BILITOT 0.3 03/22/2014   GFRNONAA >60 10/01/2015   GFRAA >60 10/01/2015    Lab Results  Component  Value Date   WBC 5.8 08/22/2014   NEUTROABS 4 08/22/2014   HGB 11.9* 08/22/2014   HCT 37* 08/22/2014   MCV 94 03/26/2014   PLT 211 08/22/2014   Lab Results  Component Value Date   PSA 38.79* 07/02/2015     STUDIES: No results found.  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  PLAN:    1. 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 Patient's most recent PSA is 38.79 is unchanged from previous. Today's result is pending. He continues to refuse monthly treatments as recommended, in fact has now requested less frequent follow-up, therefore will return to clinic in 6 months for further evaluation, recheck PSA, continuation of Xgeva and Lupron. 2. Anemia: Improved, monitor.  3. Thrombocytopenia: Resolved. 4. Bony metastasis: Xgeva as above.  Patient expressed understanding and was in agreement with this plan. He also understands that He can call clinic at any time with any questions, concerns, or complaints.   Prostate cancer metastatic to bone    Staging form: Prostate, AJCC 7th Edition     Clinical stage from 09/24/2014: Stage IV (TX, NX, M1b, PSA: 20 or greater, Gleason X - Gleason score cannot be processed) - Signed by Lloyd Huger, MD on 09/24/2014   Lloyd Huger, MD   10/01/2015 3:15 PM

## 2015-10-01 NOTE — Progress Notes (Signed)
Patient ambulates via cane, brought to exam room 7, accompanied by his daughter.  Patient denies any pain or discomfort at the present time.  Vitals documented, medication record updated, information provided by patient.

## 2015-11-21 ENCOUNTER — Telehealth: Payer: Self-pay | Admitting: Family Medicine

## 2015-11-21 MED ORDER — FLUTICASONE-SALMETEROL 100-50 MCG/DOSE IN AEPB: 1.0000 | INHALATION_SPRAY | Freq: Two times a day (BID) | RESPIRATORY_TRACT | 12 refills | Status: DC

## 2015-11-21 NOTE — Telephone Encounter (Signed)
Pt had gotten a sample of Advair and it has been helping him.  Can he get a RX for one.  He uses [facility] back is [phone]  Thanks Con Memos

## 2015-11-21 NOTE — Telephone Encounter (Signed)
RX sent in-aa 

## 2016-01-01 ENCOUNTER — Ambulatory Visit: Payer: Self-pay

## 2016-01-01 ENCOUNTER — Ambulatory Visit: Payer: Self-pay | Admitting: Oncology

## 2016-01-01 ENCOUNTER — Other Ambulatory Visit: Payer: Self-pay

## 2016-01-17 ENCOUNTER — Ambulatory Visit (INDEPENDENT_AMBULATORY_CARE_PROVIDER_SITE_OTHER): Payer: Medicare Other

## 2016-01-17 DIAGNOSIS — Z23 Encounter for immunization: Secondary | ICD-10-CM | POA: Diagnosis not present

## 2016-01-18 ENCOUNTER — Ambulatory Visit: Payer: Self-pay

## 2016-01-21 ENCOUNTER — Ambulatory Visit (INDEPENDENT_AMBULATORY_CARE_PROVIDER_SITE_OTHER): Payer: Medicare Other | Admitting: Family Medicine

## 2016-01-21 VITALS — BP 164/86 | HR 76 | Temp 97.6°F | Resp 18 | Wt 180.0 lb

## 2016-01-21 DIAGNOSIS — R059 Cough, unspecified: Secondary | ICD-10-CM

## 2016-01-21 DIAGNOSIS — R350 Frequency of micturition: Secondary | ICD-10-CM | POA: Diagnosis not present

## 2016-01-21 DIAGNOSIS — R05 Cough: Secondary | ICD-10-CM

## 2016-01-21 LAB — POCT URINALYSIS DIPSTICK
Bilirubin, UA: NEGATIVE
Blood, UA: NEGATIVE
Glucose, UA: NEGATIVE
KETONES UA: NEGATIVE
LEUKOCYTES UA: NEGATIVE
Nitrite, UA: NEGATIVE
PH UA: 6
PROTEIN UA: NEGATIVE
SPEC GRAV UA: 1.015
Urobilinogen, UA: NEGATIVE

## 2016-01-21 LAB — GLUCOSE, POCT (MANUAL RESULT ENTRY): POC Glucose: 110 mg/dl — AB (ref 70–99)

## 2016-01-21 MED ORDER — FLUTICASONE-SALMETEROL 100-50 MCG/DOSE IN AEPB: 1.0000 | INHALATION_SPRAY | Freq: Two times a day (BID) | RESPIRATORY_TRACT | 2 refills | Status: DC

## 2016-01-21 NOTE — Progress Notes (Signed)
Steven Greene  MRN: [id number] DOB: 01-11-1920  Subjective:  HPI   The patient is a 82 year old male who presents for evaluation of his right sided neck pain that radiates into his head.  He states he has had headaches for years but the pain in  His neck has been for about 1 year.  He states that they are more frequently now.  Patient has a whole list to go through  today. During the visit he expressed concerns about the cost of the contrast cancer center but in reality the nose calls were minimal as what they charged him what workup a by Medicare and the patient were small. The neck pain and mild headache the patient describes above is chronic for at least a year and probably more. The cough that he has is much improved when he uses his Advair. He does not use this because of cost. His last complaint today is one of nocturia 2 and some urinary frequency. Is concerned that this is from metastatic prostate cancer.  Patient Active Problem List   Diagnosis Date Noted  . Hypertension 12/08/2014  . Chronic obstructive airway disease ([place]) 12/08/2014  . Asthma 12/08/2014  . GERD (gastroesophageal reflux disease) 12/08/2014  . Osteoarthritis 12/08/2014  . Chronic back pain 12/08/2014  . BPH (benign prostatic hypertrophy) 12/08/2014  . Tremor 12/08/2014  . Prostate cancer metastatic to bone [facility]) 08/31/2014    Past Medical History:  Diagnosis Date  . Arthritis   . Asthma   . Cancer [facility]    prostate cancer-treated medically  . COPD (chronic obstructive pulmonary disease) ([place])   . Foot drop, left   . HOH (hard of hearing)   . Tremors of nervous system     Social History   Social History  . Marital status: Married    Spouse name: N/A  . Number of children: N/A  . Years of education: N/A   Occupational History  . Not on file.   Social History Main Topics  . Smoking status: Never Smoker  . Smokeless tobacco: Never Used  . Alcohol use No  . Drug use: No  . Sexual activity:  Not on file   Other Topics Concern  . Not on file   Social History Narrative  . No narrative on file    Outpatient Encounter Prescriptions as of 01/21/2016  Medication Sig  . acetaminophen (TYLENOL) 500 MG tablet Take 500 mg by mouth every 6 (six) hours as needed.  . Fluticasone-Salmeterol (ADVAIR DISKUS) 100-50 MCG/DOSE AEPB Inhale 1 puff into the lungs 2 (two) times daily.  . propranolol (INDERAL) 20 MG tablet Take 1 tablet (20 mg total) by mouth daily.  . [DISCONTINUED] guaiFENesin-dextromethorphan (ROBITUSSIN DM) 100-10 MG/5ML syrup Take 5 mLs by mouth every 4 (four) hours as needed for cough.  . [DISCONTINUED] pantoprazole (PROTONIX) 40 MG tablet Take 1 tablet (40 mg total) by mouth daily.   No facility-administered encounter medications on file as of 01/21/2016.     No Known Allergies  Review of Systems  Constitutional: Negative for chills, fever and malaise/fatigue.  Eyes: Negative.   Respiratory: Positive for cough and wheezing. Negative for sputum production and shortness of breath.   Cardiovascular: Negative for chest pain, palpitations and orthopnea.  Gastrointestinal: Negative.   Genitourinary: Positive for frequency.  Musculoskeletal: Positive for neck pain.  Neurological: Positive for dizziness and headaches. Negative for weakness.  Endo/Heme/Allergies: Negative.   Psychiatric/Behavioral: Negative.    Objective:  BP (!) 164/86 (BP  Location: Right Arm, Patient Position: Sitting, Cuff Size: Normal)   Pulse 76   Temp 97.6 F (36.4 C) (Oral)   Resp 18   Wt 180 lb (81.6 kg)   BMI 25.83 kg/m   Physical Exam  Constitutional: He is well-developed, well-nourished, and in no distress.  HENT:  Head: Normocephalic.  Eyes: Pupils are equal, round, and reactive to light.  Neck: Normal range of motion.  Cardiovascular: Normal rate and regular rhythm.   Pulmonary/Chest: Effort normal and breath sounds normal.  II/VI systolic murmur at the LLSB    Assessment and  Plan :   1. Cough Use Advair daily and I think the cough will be much improved as a has for many years. - Fluticasone-Salmeterol (ADVAIR DISKUS) 100-50 MCG/DOSE AEPB; Inhale 1 puff into the lungs 2 (two) times daily.  Dispense: 60 each; Refill: 2  2. Frequency of urination Patient to cut down on liquids after dinner at night. Offered referral back to urology. UA today is normal. Blood sugar is normal. I do not think this is a prostatitis. - POCT urinalysis dipstick - POCT Glucose (CBG) 3. Metastatic prostate cancer Certainly possible that some of the symptoms are from the prostate cancer. As discussed with patient and his neighbor who is a sometime caregiver. The patient is very wise and does not drive. More  than 50% of this 35 minute visit is spent in counseling. 5. Chronic foot drop 6. OA 7. GERD I have done the exam and reviewed the chart and it is accurate to the best of my knowledge. Miguel Aschoff M.D. [facility]  HPI, Exam and A&P Transcribed under the direction and in the presence of Wilhemena Durie., MD. Electronically Signed: Althea Charon, [place]

## 2016-02-04 ENCOUNTER — Ambulatory Visit: Payer: Self-pay | Admitting: Family Medicine

## 2016-02-12 ENCOUNTER — Encounter: Payer: Self-pay | Admitting: Physician Assistant

## 2016-02-12 ENCOUNTER — Ambulatory Visit
Admission: RE | Admit: 2016-02-12 | Discharge: 2016-02-12 | Disposition: A | Discharge status: Home / Self Care | Payer: Medicare Other | Source: Ambulatory Visit | Attending: Physician Assistant | Admitting: Physician Assistant

## 2016-02-12 ENCOUNTER — Ambulatory Visit (INDEPENDENT_AMBULATORY_CARE_PROVIDER_SITE_OTHER): Payer: Medicare Other | Admitting: Physician Assistant

## 2016-02-12 ENCOUNTER — Ambulatory Visit: Payer: Self-pay

## 2016-02-12 ENCOUNTER — Telehealth: Payer: Self-pay | Admitting: Family Medicine

## 2016-02-12 VITALS — BP 122/60 | HR 80 | Temp 97.8°F | Resp 16

## 2016-02-12 DIAGNOSIS — Z96641 Presence of right artificial hip joint: Secondary | ICD-10-CM | POA: Diagnosis not present

## 2016-02-12 DIAGNOSIS — W19XXXA Unspecified fall, initial encounter: Secondary | ICD-10-CM

## 2016-02-12 DIAGNOSIS — M25551 Pain in right hip: Secondary | ICD-10-CM

## 2016-02-12 DIAGNOSIS — M25572 Pain in left ankle and joints of left foot: Secondary | ICD-10-CM | POA: Diagnosis not present

## 2016-02-12 DIAGNOSIS — S99912A Unspecified injury of left ankle, initial encounter: Secondary | ICD-10-CM | POA: Diagnosis not present

## 2016-02-12 DIAGNOSIS — S79912A Unspecified injury of left hip, initial encounter: Secondary | ICD-10-CM | POA: Diagnosis not present

## 2016-02-12 DIAGNOSIS — M25552 Pain in left hip: Secondary | ICD-10-CM | POA: Diagnosis not present

## 2016-02-12 DIAGNOSIS — S79911A Unspecified injury of right hip, initial encounter: Secondary | ICD-10-CM | POA: Diagnosis not present

## 2016-02-12 NOTE — Patient Instructions (Signed)
Hip Fracture A hip fracture is a fracture of the upper part of your thigh bone (femur). What are the causes? A hip fracture is caused by a direct blow to the side of your hip. This is usually the result of a fall but can occur in other circumstances, such as an automobile accident. What increases the risk? There is an increased risk of hip fractures in people with:  An unsteady walking pattern (gait) and those with conditions that contribute to poor balance, such as Parkinson's disease or dementia.  Osteopenia and osteoporosis.  Cancer that spreads to the leg bones.  Certain metabolic diseases.  What are the signs or symptoms? Symptoms of hip fracture include:  Pain over the injured hip.  Inability to put weight on the leg in which the fracture occurred (although, some patients are able to walk after a hip fracture).  Toes and foot of the affected leg point outward when you lie down.  How is this diagnosed? A physical exam can determine if a hip fracture is likely to have occurred. X-ray exams are needed to confirm the fracture and to look for other injuries. The X-ray exam can help to determine the type of hip fracture. Rarely, the fracture is not visible on an X-ray image and a CT scan or MRI will have to be done. How is this treated? The treatment for a fracture is usually surgery. This means using a screw, nail, or rod to hold the bones in place. Follow these instructions at home: Take all medicines as directed by your health care provider. Contact a health care provider if: Pain continues, even after taking pain medicine. This information is not intended to replace advice given to you by your health care provider. Make sure you discuss any questions you have with your health care provider. Document Released: 03/10/2005 Document Revised: 08/16/2015 Document Reviewed: 10/20/2012 Elsevier Interactive Patient Education  2017 [employer].  

## 2016-02-12 NOTE — Telephone Encounter (Signed)
Daughter is calling back about her father's hip xray.   The xray tech told them they should hear from [place] today.   Call back is [phone].

## 2016-02-12 NOTE — Progress Notes (Signed)
Patient: Steven Greene Male    DOB: 1919/11/20   82 y.o.   MRN: [id number] Visit Date: 02/12/2016  Today's Provider: Trinna Post, PA-C   Chief Complaint  Patient presents with  . Fall  . Hip Pain    right hip   Subjective:    HPI Pt is a 82 year old man with a history of right hip arthroplasty, left ankle fracture with possible hardware (patient is unsure), and prostate cancer on lupron and denosumab injections who is here today for a fall. He was in the kitchen, he was trying to get the bottom drawer of the refrigerator in place and fell onto his right side. He thought he was bracing himself when he fell. He reports that he hit his knee and hit on the right side. He reports that it kept him up some last night because he could not get comfortable. He can walk on it but he can tell something is wrong with his hip when he walks. No loss of sensation. No bleeding or open wound. When he got up this morning he stood up and walked to kitchen for breakfast and " I got as sick as I've ever been" He reports that he never threw up but was severely nauseated. Pt reports that it eventually went away and he was able to eat breakfast. He reports feeling well now. His left ankle is bothering him. He does not believe this happened in the fall but he broke this ankle before and has swelling in it ever since. He also has a red place on the inside of his left ankle that he says gets hot sometimes.     No Known Allergies   Current Outpatient Prescriptions:  .  acetaminophen (TYLENOL) 500 MG tablet, Take 500 mg by mouth every 6 (six) hours as needed., Disp: , Rfl:  .  Fluticasone-Salmeterol (ADVAIR DISKUS) 100-50 MCG/DOSE AEPB, Inhale 1 puff into the lungs 2 (two) times daily., Disp: 60 each, Rfl: 12 .  propranolol (INDERAL) 20 MG tablet, Take 1 tablet (20 mg total) by mouth daily., Disp: 90 tablet, Rfl: 3 .  Fluticasone-Salmeterol (ADVAIR DISKUS) 100-50 MCG/DOSE AEPB, Inhale 1 puff into the lungs 2  (two) times daily., Disp: 60 each, Rfl: 2  Review of Systems  Constitutional: Negative.   HENT: Negative.   Eyes: Negative.   Respiratory: Negative.   Cardiovascular: Negative.   Gastrointestinal: Negative.   Endocrine: Negative.   Genitourinary: Negative.   Musculoskeletal: Positive for arthralgias and joint swelling.  Skin: Negative.   Allergic/Immunologic: Negative.   Neurological: Negative.   Hematological: Negative.   Psychiatric/Behavioral: Negative.     Social History  Substance Use Topics  . Smoking status: Never Smoker  . Smokeless tobacco: Never Used  . Alcohol use No   Objective:   BP 122/60 (BP Location: Right Arm, Patient Position: Sitting, Cuff Size: Normal)   Pulse 80   Temp 97.8 F (36.6 C) (Oral)   Resp 16   Physical Exam  Constitutional: He is oriented to person, place, and time. He appears well-developed and well-nourished. No distress.  Cardiovascular:  Pulses:      Dorsalis pedis pulses are 1+ on the right side, and 1+ on the left side.  Musculoskeletal: He exhibits no tenderness.  Physical examination limited by patient's mobility. Patient is in office in wheelchair, do not want to move him to exam table for fear of exacerbating injury.  Neurological: He is alert and oriented  to person, place, and time. No cranial nerve deficit or sensory deficit.  Skin: Skin is warm, dry and intact.  There is limited mobility in his left ankle. There is an area of dry, scaly skin on his left anterior shin. It is not warm, fluctuant, or draining pus.   Psychiatric: He has a normal mood and affect. His behavior is normal.        Assessment & Plan:      Problem List Items Addressed This Visit    None    Visit Diagnoses    Fall, initial encounter    -  Primary   Relevant Orders   DG HIP UNILAT WITH PELVIS 2-3 VIEWS RIGHT   DG HIP UNILAT WITH PELVIS 2-3 VIEWS LEFT   DG Ankle Complete Left     Patient is a 82 y/o male presenting after a fall with right hip  pain concerning for fracture. Patient and his daughter have been counseled extensively that patient should proceed to ER for evaluation, considering his age, previous hip arthroplasty, and cancer requiring bone density injections. Patient and his daughter have adamantly refused this. Patient has been counseled to be non-weight bearing on his right leg. Patient reports he is not in pain, but does seem to have a high pain tolerance as he previously broke his ankle and didn't know it until somebody else told him so. Will evaluate as above. Will contact patient/family with results and further direction.  Regarding his headache, patient feels better in office now. No neurological deficits. Have advised patient and family that if this is the worst headache of his life, then it warrants emergent evaluation in the emergency room with further imaging. Patient and family state they will refuse further emergent evaluation.  The entirety of the information documented in the History of Present Illness, Review of Systems and Physical Exam were personally obtained by me. Portions of this information were initially documented by [place] and reviewed by me for thoroughness and accuracy.   I spent 25 minutes with this patient, greater than 50% of which was spent on counseling or coordination of care.  Return if symptoms worsen or fail to improve.   Patient Instructions  Hip Fracture A hip fracture is a fracture of the upper part of your thigh bone (femur). What are the causes? A hip fracture is caused by a direct blow to the side of your hip. This is usually the result of a fall but can occur in other circumstances, such as an automobile accident. What increases the risk? There is an increased risk of hip fractures in people with:  An unsteady walking pattern (gait) and those with conditions that contribute to poor balance, such as Parkinson's disease or dementia.  Osteopenia and osteoporosis.  Cancer  that spreads to the leg bones.  Certain metabolic diseases. What are the signs or symptoms? Symptoms of hip fracture include:  Pain over the injured hip.  Inability to put weight on the leg in which the fracture occurred (although, some patients are able to walk after a hip fracture).  Toes and foot of the affected leg point outward when you lie down. How is this diagnosed? A physical exam can determine if a hip fracture is likely to have occurred. X-ray exams are needed to confirm the fracture and to look for other injuries. The X-ray exam can help to determine the type of hip fracture. Rarely, the fracture is not visible on an X-ray image and a CT scan or  MRI will have to be done. How is this treated? The treatment for a fracture is usually surgery. This means using a screw, nail, or rod to hold the bones in place. Follow these instructions at home: Take all medicines as directed by your health care provider. Contact a health care provider if: Pain continues, even after taking pain medicine. This information is not intended to replace advice given to you by your health care provider. Make sure you discuss any questions you have with your health care provider. Document Released: 03/10/2005 Document Revised: 08/16/2015 Document Reviewed: 10/20/2012 Elsevier Interactive Patient Education  2017 [place], PA-C  [facility]

## 2016-02-13 ENCOUNTER — Telehealth: Payer: Self-pay

## 2016-02-13 NOTE — Telephone Encounter (Signed)
-----   Message from Trinna Post, [place] sent at 02/13/2016  8:17 AM EST ----- Xrays show post surgical changes but are otherwise negative for acute fractures.

## 2016-02-13 NOTE — Telephone Encounter (Signed)
Patient has been advised. KW 

## 2016-03-31 NOTE — Progress Notes (Signed)
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  INTERVAL HISTORY: Patient returns to clinic today for further evaluation and continuation of Xgeva and Lupron. He continues to have chronic back and neck pain, which is relieved with tylenol. He complains of a new enlarged lymph node underneath his left axilla. He continues to have mild weakness and fatigue, but is chronic and unchanged. He has no neurologic complaints. He denies any recent fevers. He has a good appetite and denies weight loss. He denies any chest pain or shortness of breath. He has no nausea, vomiting, constipation, or diarrhea. He has no urinary complaints. Patient offers no further specific complaints today.  REVIEW OF SYSTEMS:   Review of Systems  Constitutional: Negative for weight loss and malaise/fatigue.  Respiratory: Negative.   Cardiovascular: Negative.   Musculoskeletal: Positive for back pain.  Neurological: Negative for weakness.    As per HPI. Otherwise, a complete review of systems is negative.  PAST MEDICAL HISTORY: Past Medical History:  Diagnosis Date  . Arthritis   . Asthma   . Cancer [facility])    prostate cancer-treated medically  . COPD (chronic obstructive pulmonary disease) ([place])   . Foot drop, left   . HOH (hard of hearing)   . Tremors of nervous system     PAST SURGICAL HISTORY: Past Surgical History:  Procedure Laterality Date  . ANKLE FRACTURE SURGERY  2009   lt  . ANKLE FRACTURE SURGERY     rt  . EYE SURGERY     both cataracts  . HEMORRHOID SURGERY    . JOINT REPLACEMENT  2000   rt hip replacement    FAMILY HISTORY: Son with T-cell lymphoma, siblings with pancreatic cancer.     ADVANCED DIRECTIVES:    HEALTH  MAINTENANCE: Social History  Substance Use Topics  . Smoking status: Never Smoker  . Smokeless tobacco: Never Used  . Alcohol use No     Colonoscopy:  PAP:  Bone density:  Lipid panel:  No Known Allergies  Current Outpatient Prescriptions  Medication Sig Dispense Refill  . acetaminophen (TYLENOL) 500 MG tablet Take 500 mg by mouth every 6 (six) hours as needed.    . Fluticasone-Salmeterol (ADVAIR DISKUS) 100-50 MCG/DOSE AEPB Inhale 1 puff into the lungs 2 (two) times daily. 60 each 12  . Fluticasone-Salmeterol (ADVAIR DISKUS) 100-50 MCG/DOSE AEPB Inhale 1 puff into the lungs 2 (two) times daily. 60 each 2  . propranolol (INDERAL) 20 MG tablet Take 1 tablet (20 mg total) by mouth daily. 90 tablet 3   No current facility-administered medications for this visit.     OBJECTIVE: Vitals:   04/02/16 1454  BP: (!) 161/82  Pulse: 77  Resp: 20  Temp: (!) 96.9 F (36.1 C)     Body mass index is 26.22 kg/m.    ECOG FS:1 - Symptomatic but completely ambulatory  General: Well-developed, well-nourished, no acute distress. Eyes: anicteric sclera. Lungs: Clear to auscultation bilaterally. Heart: Regular rate and rhythm. No rubs, murmurs, or gallops. Abdomen: Soft, nontender, nondistended. No organomegaly noted, normoactive bowel sounds. Musculoskeletal: No edema, cyanosis or clubbing. Neuro: Alert, answering all questions appropriately. Cranial nerves grossly intact. Skin: No rashes or petechiae noted. Psych: Normal affect. Lymphatics: Easily palpable 2 cm lymph node in left axilla.  LAB RESULTS:  Lab Results  Component Value Date   NA 136 04/02/2016   K 4.5 04/02/2016   CL 104 04/02/2016   CO2 25 04/02/2016   GLUCOSE 95 04/02/2016   BUN 17 04/02/2016   CREATININE 0.81 04/02/2016   CALCIUM 9.1 04/02/2016   PROT 7.0 04/02/2016   ALBUMIN 4.0 04/02/2016   AST 23 04/02/2016   ALT 11 (L) 04/02/2016   ALKPHOS 80 04/02/2016   BILITOT 0.6 04/02/2016   GFRNONAA >60 04/02/2016     GFRAA >60 04/02/2016    Lab Results  Component Value Date   WBC 5.7 04/02/2016   NEUTROABS 3.5 04/02/2016   HGB 11.8 (L) 04/02/2016   HCT 35.4 (L) 04/02/2016   MCV 93.8 04/02/2016   PLT 170 04/02/2016   Lab Results  Component Value Date   PSA 57.68 (H) 04/02/2016     STUDIES: No results found.  ASSESSMENT: Progressive stage IV prostate cancer with bony metastasis.  PLAN:    1. Progressive stage IV prostate cancer with bony metastasis: Proceed with Xgeva and Lupron today. Patient's most recent PSA is trending up. Unclear if his no axillary lymph node is progression of disease or something else. We will get an ultrasound-guided lymph node biopsy as well as a nuclear med bone scan in the next week. Will call patient with results. He continues to request minimal follow-up. Return to clinic in 6 months for further evaluation, recheck PSA, continuation of Xgeva and Lupron. 2. Anemia: Improved, monitor.  3. Thrombocytopenia: Resolved. 4. Bony metastasis: Xgeva as above. 5. Enlarged axillary lymph node: Ultrasound biopsy as above.  Patient expressed understanding and was in agreement with this plan. He also understands that He can call clinic at any time with any questions, concerns, or complaints.   Prostate cancer metastatic to bone    Staging form: Prostate, AJCC 7th Edition     Clinical stage from 09/24/2014: Stage IV (TX, NX, M1b, PSA: 20 or greater, Gleason X - Gleason score cannot be processed) - Signed by Lloyd Huger, MD on 09/24/2014   Lloyd Huger, MD   04/04/2016 2:58 PM

## 2016-04-02 ENCOUNTER — Other Ambulatory Visit: Payer: Self-pay | Admitting: *Deleted

## 2016-04-02 ENCOUNTER — Inpatient Hospital Stay: Payer: Medicare Other

## 2016-04-02 ENCOUNTER — Inpatient Hospital Stay: Payer: Medicare Other | Attending: Oncology | Admitting: Oncology

## 2016-04-02 VITALS — BP 161/82 | HR 77 | Temp 96.9°F | Resp 20 | Wt 182.8 lb

## 2016-04-02 DIAGNOSIS — M542 Cervicalgia: Secondary | ICD-10-CM | POA: Diagnosis not present

## 2016-04-02 DIAGNOSIS — C7951 Secondary malignant neoplasm of bone: Principal | ICD-10-CM

## 2016-04-02 DIAGNOSIS — M549 Dorsalgia, unspecified: Secondary | ICD-10-CM | POA: Diagnosis not present

## 2016-04-02 DIAGNOSIS — R531 Weakness: Secondary | ICD-10-CM | POA: Diagnosis not present

## 2016-04-02 DIAGNOSIS — R251 Tremor, unspecified: Secondary | ICD-10-CM | POA: Diagnosis not present

## 2016-04-02 DIAGNOSIS — C61 Malignant neoplasm of prostate: Secondary | ICD-10-CM

## 2016-04-02 DIAGNOSIS — R591 Generalized enlarged lymph nodes: Secondary | ICD-10-CM | POA: Diagnosis not present

## 2016-04-02 DIAGNOSIS — J449 Chronic obstructive pulmonary disease, unspecified: Secondary | ICD-10-CM | POA: Insufficient documentation

## 2016-04-02 DIAGNOSIS — R5383 Other fatigue: Secondary | ICD-10-CM | POA: Insufficient documentation

## 2016-04-02 DIAGNOSIS — Z79899 Other long term (current) drug therapy: Secondary | ICD-10-CM | POA: Insufficient documentation

## 2016-04-02 DIAGNOSIS — C8334 Diffuse large B-cell lymphoma, lymph nodes of axilla and upper limb: Secondary | ICD-10-CM | POA: Insufficient documentation

## 2016-04-02 DIAGNOSIS — D649 Anemia, unspecified: Secondary | ICD-10-CM | POA: Diagnosis not present

## 2016-04-02 DIAGNOSIS — Z8546 Personal history of malignant neoplasm of prostate: Secondary | ICD-10-CM

## 2016-04-02 LAB — CBC WITH DIFFERENTIAL/PLATELET
BASOS ABS: 0.1 10*3/uL (ref 0–0.1)
Basophils Relative: 1 %
Eosinophils Absolute: 0.3 10*3/uL (ref 0–0.7)
Eosinophils Relative: 6 %
HCT: 35.4 % — ABNORMAL LOW (ref 40.0–52.0)
Hemoglobin: 11.8 g/dL — ABNORMAL LOW (ref 13.0–18.0)
LYMPHS ABS: 1.3 10*3/uL (ref 1.0–3.6)
LYMPHS PCT: 23 %
MCH: 31.3 pg (ref 26.0–34.0)
MCHC: 33.4 g/dL (ref 32.0–36.0)
MCV: 93.8 fL (ref 80.0–100.0)
Monocytes Absolute: 0.5 10*3/uL (ref 0.2–1.0)
Monocytes Relative: 9 %
Neutro Abs: 3.5 10*3/uL (ref 1.4–6.5)
Neutrophils Relative %: 61 %
Platelets: 170 10*3/uL (ref 150–440)
RBC: 3.77 MIL/uL — AB (ref 4.40–5.90)
RDW: 14.6 % — ABNORMAL HIGH (ref 11.5–14.5)
WBC: 5.7 10*3/uL (ref 3.8–10.6)

## 2016-04-02 LAB — COMPREHENSIVE METABOLIC PANEL
ALK PHOS: 80 U/L (ref 38–126)
ALT: 11 U/L — AB (ref 17–63)
AST: 23 U/L (ref 15–41)
Albumin: 4 g/dL (ref 3.5–5.0)
Anion gap: 7 (ref 5–15)
BILIRUBIN TOTAL: 0.6 mg/dL (ref 0.3–1.2)
BUN: 17 mg/dL (ref 6–20)
CALCIUM: 9.1 mg/dL (ref 8.9–10.3)
CHLORIDE: 104 mmol/L (ref 101–111)
CO2: 25 mmol/L (ref 22–32)
CREATININE: 0.81 mg/dL (ref 0.61–1.24)
Glucose, Bld: 95 mg/dL (ref 65–99)
Potassium: 4.5 mmol/L (ref 3.5–5.1)
Sodium: 136 mmol/L (ref 135–145)
TOTAL PROTEIN: 7 g/dL (ref 6.5–8.1)

## 2016-04-02 LAB — PSA: PSA: 57.68 ng/mL — AB (ref 0.00–4.00)

## 2016-04-02 MED ORDER — LEUPROLIDE ACETATE (4 MONTH) 30 MG IM KIT: 45.0000 mg | PACK | Freq: Once | INTRAMUSCULAR | Status: DC

## 2016-04-02 MED ORDER — DENOSUMAB 120 MG/1.7ML ~~LOC~~ SOLN
120.0000 mg | Freq: Once | SUBCUTANEOUS | Status: AC
  Administered 2016-04-02: 120 mg via SUBCUTANEOUS
  Filled 2016-04-02: qty 1.7

## 2016-04-02 MED ORDER — LEUPROLIDE ACETATE (6 MONTH) 45 MG IM KIT
45.0000 mg | PACK | Freq: Once | INTRAMUSCULAR | Status: AC
  Administered 2016-04-02: 45 mg via INTRAMUSCULAR
  Filled 2016-04-02: qty 45

## 2016-04-02 NOTE — Progress Notes (Signed)
Patient is here today for a follow up, he mentions pain on the right side of his body. He has pain in his left foot. He also mentions sometimes his right arm will "vibrate" and tingle but it goes away. Patient fell back in Nov 2017 did go get checked out and xray show no broken bones, declined MRI

## 2016-04-04 ENCOUNTER — Other Ambulatory Visit: Payer: Self-pay | Admitting: Radiology

## 2016-04-07 ENCOUNTER — Other Ambulatory Visit: Payer: Self-pay | Admitting: Radiology

## 2016-04-07 ENCOUNTER — Encounter: Admission: RE | Admit: 2016-04-07 | Payer: Medicare Other | Source: Ambulatory Visit

## 2016-04-07 ENCOUNTER — Ambulatory Visit: Admission: RE | Admit: 2016-04-07 | Payer: Medicare Other | Source: Ambulatory Visit

## 2016-04-08 ENCOUNTER — Ambulatory Visit
Admission: RE | Admit: 2016-04-08 | Discharge: 2016-04-08 | Disposition: A | Discharge status: Home / Self Care | Payer: Medicare Other | Source: Ambulatory Visit | Attending: Oncology | Admitting: Oncology

## 2016-04-08 DIAGNOSIS — C8334 Diffuse large B-cell lymphoma, lymph nodes of axilla and upper limb: Secondary | ICD-10-CM | POA: Diagnosis not present

## 2016-04-08 DIAGNOSIS — C7951 Secondary malignant neoplasm of bone: Secondary | ICD-10-CM | POA: Insufficient documentation

## 2016-04-08 DIAGNOSIS — R229 Localized swelling, mass and lump, unspecified: Secondary | ICD-10-CM | POA: Diagnosis not present

## 2016-04-08 DIAGNOSIS — R590 Localized enlarged lymph nodes: Secondary | ICD-10-CM | POA: Diagnosis not present

## 2016-04-08 DIAGNOSIS — C61 Malignant neoplasm of prostate: Secondary | ICD-10-CM

## 2016-04-08 DIAGNOSIS — C8330 Diffuse large B-cell lymphoma, unspecified site: Secondary | ICD-10-CM | POA: Insufficient documentation

## 2016-04-08 HISTORY — DX: Unilateral inguinal hernia, without obstruction or gangrene, not specified as recurrent: K40.90

## 2016-04-08 MED ORDER — SODIUM CHLORIDE 0.9 % IV SOLN: INTRAVENOUS | Status: DC

## 2016-04-08 NOTE — Procedures (Signed)
[place] core L ax LAN No complication No blood loss. See complete dictation in [facility].

## 2016-04-15 ENCOUNTER — Encounter
Admission: RE | Admit: 2016-04-15 | Discharge: 2016-04-15 | Disposition: A | Discharge status: Home / Self Care | Payer: Medicare Other | Source: Ambulatory Visit | Attending: Oncology | Admitting: Oncology

## 2016-04-15 DIAGNOSIS — C61 Malignant neoplasm of prostate: Secondary | ICD-10-CM | POA: Diagnosis not present

## 2016-04-15 DIAGNOSIS — C7951 Secondary malignant neoplasm of bone: Secondary | ICD-10-CM | POA: Diagnosis not present

## 2016-04-15 MED ORDER — TECHNETIUM TC 99M MEDRONATE IV KIT
25.0000 | PACK | Freq: Once | INTRAVENOUS | Status: AC | PRN
  Administered 2016-04-15: 21 via INTRAVENOUS

## 2016-04-16 LAB — SURGICAL PATHOLOGY

## 2016-04-20 DIAGNOSIS — C8330 Diffuse large B-cell lymphoma, unspecified site: Secondary | ICD-10-CM | POA: Insufficient documentation

## 2016-04-20 NOTE — Progress Notes (Signed)
Steven Greene  Telephone:([phone] Fax:(336) [phone]  ID: [name] OB: 13-Mar-1920  MR#: [id number]  SAY#:[id number]  Patient Care Team: Jerrol Banana., MD as PCP - General (Unknown Physician Specialty)  CHIEF COMPLAINT: Progressive stage IV prostate cancer with bony metastasis, diffuse large B-cell lymphoma  INTERVAL HISTORY: Patient returns to clinic today for further evaluation and discussion of his biopsy results. He continues to have chronic back and neck pain, which is relieved with tylenol. He continues to have right hip/leg pain.  He continues to have mild weakness and fatigue, but is chronic and unchanged. He has no neurologic complaints. He denies any recent fevers. He has a good appetite and denies weight loss. He denies any chest pain or shortness of breath. He has no nausea, vomiting, constipation, or diarrhea. He has no urinary complaints. Patient offers no further specific complaints today.  REVIEW OF SYSTEMS:   Review of Systems  Constitutional: Negative for weight loss and malaise/fatigue.  Respiratory: Negative.   Cardiovascular: Negative.   Musculoskeletal: Positive for back pain.  Neurological: Negative for weakness.    As per HPI. Otherwise, a complete review of systems is negative.  PAST MEDICAL HISTORY: Past Medical History:  Diagnosis Date  . Arthritis   . Asthma   . Cancer [facility])    prostate cancer-treated medically  . COPD (chronic obstructive pulmonary disease) ([place])   . Foot drop, left   . Hernia, inguinal, left   . HOH (hard of hearing)   . Tremors of nervous system     PAST SURGICAL HISTORY: Past Surgical History:  Procedure Laterality Date  . ANKLE FRACTURE SURGERY  2009   lt  . ANKLE FRACTURE SURGERY     rt  . EYE SURGERY     both cataracts  . HEMORRHOID SURGERY    . JOINT REPLACEMENT  2000   rt hip replacement  . WRIST FRACTURE SURGERY Bilateral     FAMILY HISTORY: Son with T-cell lymphoma, siblings  with pancreatic cancer.     ADVANCED DIRECTIVES:    HEALTH MAINTENANCE: Social History  Substance Use Topics  . Smoking status: Never Smoker  . Smokeless tobacco: Never Used  . Alcohol use No     Colonoscopy:  PAP:  Bone density:  Lipid panel:  No Known Allergies  Current Outpatient Prescriptions  Medication Sig Dispense Refill  . acetaminophen (TYLENOL) 500 MG tablet Take 500 mg by mouth every 6 (six) hours as needed.    . Fluticasone-Salmeterol (ADVAIR DISKUS) 100-50 MCG/DOSE AEPB Inhale 1 puff into the lungs 2 (two) times daily. 60 each 12  . Fluticasone-Salmeterol (ADVAIR DISKUS) 100-50 MCG/DOSE AEPB Inhale 1 puff into the lungs 2 (two) times daily. 60 each 2  . propranolol (INDERAL) 20 MG tablet Take 1 tablet (20 mg total) by mouth daily. 90 tablet 3   No current facility-administered medications for this visit.     OBJECTIVE: Vitals:   04/21/16 1025  BP: (!) 150/85  Pulse: 74  Resp: 18  Temp: (!) 96.6 F (35.9 C)     Body mass index is 26.19 kg/m.    ECOG FS:1 - Symptomatic but completely ambulatory  General: Well-developed, well-nourished, no acute distress. Eyes: anicteric sclera. Lungs: Clear to auscultation bilaterally. Heart: Regular rate and rhythm. No rubs, murmurs, or gallops. Abdomen: Soft, nontender, nondistended. No organomegaly noted, normoactive bowel sounds. Musculoskeletal: No edema, cyanosis or clubbing. Neuro: Alert, answering all questions appropriately. Cranial nerves grossly intact. Skin: No rashes or petechiae noted.  Psych: Normal affect. Lymphatics: Easily palpable 2 cm lymph node in left axilla.  LAB RESULTS:  Lab Results  Component Value Date   NA 136 04/02/2016   K 4.5 04/02/2016   CL 104 04/02/2016   CO2 25 04/02/2016   GLUCOSE 95 04/02/2016   BUN 17 04/02/2016   CREATININE 0.81 04/02/2016   CALCIUM 9.1 04/02/2016   PROT 7.0 04/02/2016   ALBUMIN 4.0 04/02/2016   AST 23 04/02/2016   ALT 11 (L) 04/02/2016   ALKPHOS 80  04/02/2016   BILITOT 0.6 04/02/2016   GFRNONAA >60 04/02/2016   GFRAA >60 04/02/2016    Lab Results  Component Value Date   WBC 5.7 04/02/2016   NEUTROABS 3.5 04/02/2016   HGB 11.8 (L) 04/02/2016   HCT 35.4 (L) 04/02/2016   MCV 93.8 04/02/2016   PLT 170 04/02/2016   Lab Results  Component Value Date   PSA 57.68 (H) 04/02/2016     STUDIES: Nm Bone Scan Whole Body  Result Date: 04/15/2016 CLINICAL DATA:  Prostate cancer metastatic to bone. EXAM: NUCLEAR MEDICINE WHOLE BODY BONE SCAN TECHNIQUE: Whole body anterior and posterior images were obtained approximately 3 hours after intravenous injection of radiopharmaceutical. RADIOPHARMACEUTICALS:  21.0 mCi Technetium-93mMDP IV COMPARISON:  Bone scan of Aug 16, 2014. FINDINGS: Large area of abnormal uptake involving the trochanteric region of proximal right femur which is significantly increased compared to prior exam, consistent with worsening metastatic disease. New focus of abnormal uptake seen involving anterior portion of left lower rib concerning for metastatic lesion. Abnormal uptake seen in left knee most consistent with degenerative change. IMPRESSION: Large area of abnormal uptake seen involving trochanteric region of proximal right femur consistent with worsening metastatic lesion. New focus of abnormal uptake seen involving anterior portion of left lower rib also consistent with metastatic disease. Electronically Signed   By: JMarijo Conception M.D.   On: 04/15/2016 15:31   [place]  Result Date: 04/08/2016 CLINICAL DATA:  Palpable left axillary mass. History of prostate carcinoma. EXAM: ULTRASOUND GUIDED CORE BIOPSY OF LEFT AXILLARY MASS MEDICATIONS: Lidocaine 1% subcutaneous PROCEDURE: The procedure, risks, benefits, and alternatives were explained to the patient. Questions regarding the procedure were encouraged and answered. The patient understands and consents to the procedure. Survey ultrasound of the left axilla was performed  and at mixed attenuation 4.6 cm mass corresponding to palpable region was localized. An appropriate skin entry site was determined and marked. The operative field was prepped with chlorhexidine in a sterile fashion, and a sterile drape was applied covering the operative field. A sterile gown and sterile gloves were used for the procedure. Local anesthesia was provided with 1% Lidocaine. Under ultrasound guidance, 18-gauge core biopsy samples were obtained, submitted in saline to surgical pathology. The guide needle was removed. Postprocedure scans show no hemorrhage or other apparent complication. The patient tolerated the procedure well. COMPLICATIONS: None. FINDINGS: 4.6 cm complex solid left axillary mass corresponding to palpable abnormality. Representative core biopsy samples obtained under ultrasound guidance. IMPRESSION: 1. Technically successful ultrasound-guided core biopsy of left axillary mass. Electronically Signed   By: DLucrezia EuropeM.D.   On: 04/08/2016 11:34    ASSESSMENT: Progressive stage IV prostate cancer with bony metastasis, diffuse large B-cell lymphoma.  PLAN:    1. Progressive stage IV prostate cancer with bony metastasis: Patient received Xgeva and Lupron last week. Patient's most recent PSA is trending up. He continues to request minimal follow-up.  2. Diffuse large B-cell lymphoma: Axillary lymph node biopsied  and pathology results reviewed independently. Patient is still hesitant to pursue aggressive treatment, but has agreed to do a PET scan to determine the extent of disease. He has declined bone marrow biopsy to complete staging workup. Patient will return to clinic in 1 week to discuss the results and also decide whether he wishes to pursue treatment or consider comfort care. 3. Anemia: Improved, monitor.  4. Thrombocytopenia: Resolved. 5. Bony metastasis: Nuclear med bone scan results reviewed independently. Patient was given a referral to radiation oncology for  consideration of palliative XRT to the lesion in his right leg.   Approximately 30 minutes was spent in discussion of which greater than 50% was consultation.  Patient expressed understanding and was in agreement with this plan. He also understands that He can call clinic at any time with any questions, concerns, or complaints.   Prostate cancer metastatic to bone    Staging form: Prostate, AJCC 7th Edition     Clinical stage from 09/24/2014: Stage IV (TX, NX, M1b, PSA: 20 or greater, Gleason X - Gleason score cannot be processed) - Signed by Lloyd Huger, MD on 09/24/2014   Lloyd Huger, MD   04/23/2016 1:45 PM

## 2016-04-21 ENCOUNTER — Inpatient Hospital Stay (HOSPITAL_BASED_OUTPATIENT_CLINIC_OR_DEPARTMENT_OTHER): Payer: Medicare Other | Admitting: Oncology

## 2016-04-21 VITALS — BP 150/85 | HR 74 | Temp 96.6°F | Resp 18 | Wt 182.5 lb

## 2016-04-21 DIAGNOSIS — R251 Tremor, unspecified: Secondary | ICD-10-CM

## 2016-04-21 DIAGNOSIS — C61 Malignant neoplasm of prostate: Secondary | ICD-10-CM

## 2016-04-21 DIAGNOSIS — R5383 Other fatigue: Secondary | ICD-10-CM

## 2016-04-21 DIAGNOSIS — M549 Dorsalgia, unspecified: Secondary | ICD-10-CM | POA: Diagnosis not present

## 2016-04-21 DIAGNOSIS — C8334 Diffuse large B-cell lymphoma, lymph nodes of axilla and upper limb: Secondary | ICD-10-CM | POA: Diagnosis not present

## 2016-04-21 DIAGNOSIS — C7951 Secondary malignant neoplasm of bone: Secondary | ICD-10-CM | POA: Diagnosis not present

## 2016-04-21 DIAGNOSIS — Z79899 Other long term (current) drug therapy: Secondary | ICD-10-CM | POA: Diagnosis not present

## 2016-04-21 DIAGNOSIS — D649 Anemia, unspecified: Secondary | ICD-10-CM | POA: Diagnosis not present

## 2016-04-21 DIAGNOSIS — R531 Weakness: Secondary | ICD-10-CM

## 2016-04-21 DIAGNOSIS — J449 Chronic obstructive pulmonary disease, unspecified: Secondary | ICD-10-CM | POA: Diagnosis not present

## 2016-04-21 DIAGNOSIS — R591 Generalized enlarged lymph nodes: Secondary | ICD-10-CM

## 2016-04-21 DIAGNOSIS — M542 Cervicalgia: Secondary | ICD-10-CM

## 2016-04-21 NOTE — Progress Notes (Signed)
Patient states his right arm has intermittent episodes of a vibrating feeling that runs from his shoulder to his hand.  Patient recently was diagnosed by PCP with asthma/COPD and was prescribed Advair.

## 2016-04-23 ENCOUNTER — Inpatient Hospital Stay: Payer: Medicare Other | Admitting: Oncology

## 2016-04-24 ENCOUNTER — Ambulatory Visit
Admission: RE | Admit: 2016-04-24 | Discharge: 2016-04-24 | Disposition: A | Discharge status: Home / Self Care | Payer: Medicare Other | Source: Ambulatory Visit | Attending: Oncology | Admitting: Oncology

## 2016-04-24 DIAGNOSIS — I2510 Atherosclerotic heart disease of native coronary artery without angina pectoris: Secondary | ICD-10-CM | POA: Diagnosis not present

## 2016-04-24 DIAGNOSIS — I700 Atherosclerosis of aorta: Secondary | ICD-10-CM | POA: Insufficient documentation

## 2016-04-24 DIAGNOSIS — Z96641 Presence of right artificial hip joint: Secondary | ICD-10-CM | POA: Diagnosis not present

## 2016-04-24 DIAGNOSIS — C61 Malignant neoplasm of prostate: Secondary | ICD-10-CM | POA: Diagnosis not present

## 2016-04-24 DIAGNOSIS — C7951 Secondary malignant neoplasm of bone: Secondary | ICD-10-CM | POA: Diagnosis not present

## 2016-04-24 DIAGNOSIS — R591 Generalized enlarged lymph nodes: Secondary | ICD-10-CM | POA: Insufficient documentation

## 2016-04-24 DIAGNOSIS — C8334 Diffuse large B-cell lymphoma, lymph nodes of axilla and upper limb: Secondary | ICD-10-CM | POA: Diagnosis not present

## 2016-04-24 DIAGNOSIS — R590 Localized enlarged lymph nodes: Secondary | ICD-10-CM | POA: Diagnosis not present

## 2016-04-24 LAB — GLUCOSE, CAPILLARY: Glucose-Capillary: 110 mg/dL — ABNORMAL HIGH (ref 65–99)

## 2016-04-24 MED ORDER — FLUDEOXYGLUCOSE F - 18 (FDG) INJECTION
13.1800 | Freq: Once | INTRAVENOUS | Status: AC | PRN
  Administered 2016-04-24: 13.18 via INTRAVENOUS

## 2016-04-28 ENCOUNTER — Ambulatory Visit: Payer: Medicare Other | Admitting: Radiation Oncology

## 2016-04-28 ENCOUNTER — Ambulatory Visit: Payer: Self-pay | Admitting: Oncology

## 2016-04-28 NOTE — Progress Notes (Signed)
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 He continues to have chronic back and neck pain, which is relieved with tylenol. He continues to have right hip/leg pain.  He continues to have mild weakness and fatigue, but is chronic and unchanged. He has no neurologic complaints. He denies any recent fevers. He has a good appetite and denies weight loss. He denies any chest pain or shortness of breath. He has no nausea, vomiting, constipation, or diarrhea. He has no urinary complaints. Patient offers no further specific complaints today.  REVIEW OF SYSTEMS:   Review of Systems  Constitutional: Negative for weight loss and malaise/fatigue.  Respiratory: Negative.   Cardiovascular: Negative.   Musculoskeletal: Positive for back pain.  Neurological: Negative for weakness.    As per HPI. Otherwise, a complete review of systems is negative.  PAST MEDICAL HISTORY: Past Medical History:  Diagnosis Date  . Arthritis   . Asthma   . Cancer [facility])    prostate cancer-treated medically  . COPD (chronic obstructive pulmonary disease) ([place])   . Foot drop, left   . Hernia, inguinal, left   . HOH (hard of hearing)   . Tremors of nervous system     PAST SURGICAL HISTORY: Past Surgical History:  Procedure Laterality Date  . ANKLE FRACTURE SURGERY  2009   lt  . ANKLE FRACTURE SURGERY     rt  . EYE SURGERY     both cataracts  . HEMORRHOID SURGERY    . JOINT REPLACEMENT  2000   rt hip replacement  . WRIST FRACTURE SURGERY Bilateral     FAMILY HISTORY: Son with T-cell  lymphoma, siblings with pancreatic cancer.     ADVANCED DIRECTIVES:    HEALTH MAINTENANCE: Social History  Substance Use Topics  . Smoking status: Never Smoker  . Smokeless tobacco: Never Used  . Alcohol use No     Colonoscopy:  PAP:  Bone density:  Lipid panel:  No Known Allergies  Current Outpatient Prescriptions  Medication Sig Dispense Refill  . acetaminophen (TYLENOL) 500 MG tablet Take 500 mg by mouth every 6 (six) hours as needed.    . Fluticasone-Salmeterol (ADVAIR DISKUS) 100-50 MCG/DOSE AEPB Inhale 1 puff into the lungs 2 (two) times daily. (Patient taking differently: Inhale 1 puff into the lungs daily as needed. ) 60 each 12  . propranolol (INDERAL) 20 MG tablet Take 1 tablet (20 mg total) by mouth daily. 90 tablet 3  . predniSONE (DELTASONE) 20 MG tablet Take 1 tablet (20 mg total) by mouth daily with breakfast. 30 tablet 0   No current facility-administered medications for this visit.     OBJECTIVE: Vitals:   04/29/16 1051  BP: 137/72  Pulse: 70  Resp: 18  Temp: 97.1 F (36.2 C)     Body mass index is 26.1 kg/m.    ECOG FS:1 - Symptomatic but completely ambulatory  General: Well-developed, well-nourished, no acute distress. Eyes: anicteric sclera. Lungs: Clear to auscultation bilaterally. Heart: Regular rate and rhythm. No rubs, murmurs, or gallops. Abdomen: Soft, nontender, nondistended. No organomegaly noted, normoactive bowel sounds. Musculoskeletal: No edema, cyanosis or clubbing.  Neuro: Alert, answering all questions appropriately. Cranial nerves grossly intact. Skin: No rashes or petechiae noted. Psych: Normal affect. Lymphatics: Easily palpable 2 cm lymph node in left axilla.  LAB RESULTS:  Lab Results  Component Value Date   NA 136 04/02/2016   K 4.5 04/02/2016   CL 104 04/02/2016   CO2 25 04/02/2016   GLUCOSE 95 04/02/2016   BUN 17 04/02/2016   CREATININE 0.81 04/02/2016   CALCIUM 9.1 04/02/2016   PROT 7.0 04/02/2016   ALBUMIN  4.0 04/02/2016   AST 23 04/02/2016   ALT 11 (L) 04/02/2016   ALKPHOS 80 04/02/2016   BILITOT 0.6 04/02/2016   GFRNONAA >60 04/02/2016   GFRAA >60 04/02/2016    Lab Results  Component Value Date   WBC 5.7 04/02/2016   NEUTROABS 3.5 04/02/2016   HGB 11.8 (L) 04/02/2016   HCT 35.4 (L) 04/02/2016   MCV 93.8 04/02/2016   PLT 170 04/02/2016   Lab Results  Component Value Date   PSA 57.68 (H) 04/02/2016     STUDIES: Nm Bone Scan Whole Body  Result Date: 04/15/2016 CLINICAL DATA:  Prostate cancer metastatic to bone. EXAM: NUCLEAR MEDICINE WHOLE BODY BONE SCAN TECHNIQUE: Whole body anterior and posterior images were obtained approximately 3 hours after intravenous injection of radiopharmaceutical. RADIOPHARMACEUTICALS:  21.0 mCi Technetium-107m MDP IV COMPARISON:  Bone scan of Aug 16, 2014. FINDINGS: Large area of abnormal uptake involving the trochanteric region of proximal right femur which is significantly increased compared to prior exam, consistent with worsening metastatic disease. New focus of abnormal uptake seen involving anterior portion of left lower rib concerning for metastatic lesion. Abnormal uptake seen in left knee most consistent with degenerative change. IMPRESSION: Large area of abnormal uptake seen involving trochanteric region of proximal right femur consistent with worsening metastatic lesion. New focus of abnormal uptake seen involving anterior portion of left lower rib also consistent with metastatic disease. Electronically Signed   By: Marijo Conception, M.D.   On: 04/15/2016 15:31   Nm Pet Image Initial (pi) Skull Base To Thigh  Result Date: 04/24/2016 CLINICAL DATA:  Initial treatment strategy for diffuse large B-cell lymphoma of axilla. Initial staging. Metastatic prostate cancer to bone. EXAM: NUCLEAR MEDICINE PET SKULL BASE TO THIGH TECHNIQUE: 13.1 mCi F-18 FDG was injected intravenously. Full-ring PET imaging was performed from the skull base to thigh after the  radiotracer. CT data was obtained and used for attenuation correction and anatomic localization. FASTING BLOOD GLUCOSE:  Value: 110 mg/dl COMPARISON:  Bone scan of 04/15/2016. Chest radiograph of 04/12/2015. FINDINGS: NECK Small right sided cervical hypermetabolic nodes. Example right posterior triangle node which measures 8 mm and a S.U.V. max of 3.6 on image 32/series 3. Mucosal thickening of both maxillary sinuses. No cervical adenopathy. CHEST Hypermetabolic bilateral axillary bulky adenopathy. Index left axillary node measures 4.4 x 4.7 cm and a S.U.V. max of 15.3. Mediastinal and right hilar nodal hypermetabolism, including at a S.U.V. max of 5.0 within the right hilum. Retrocrural node measures 1.8 cm and a S.U.V. max of 1 9.9 on image 119/series 3. Mild cardiomegaly. Aortic valvular calcifications. Lad and probable left circumflex coronary artery atherosclerosis. Small hiatal hernia. Bibasilar scarring. ABDOMEN/PELVIS Extensive abdominopelvic hypermetabolic adenopathy. Index aortocaval node measures 1.7 cm a S.U.V. max of 11.9 on image 156/series 3. Small bowel mesenteric node measures 1.8 cm and a S.U.V. max of 10.1. Left greater than right pelvic hypermetabolic adenopathy, including a left inguinal node which measures 2.4 cm and a S.U.V. max  of 16.2. Left renal cysts. Right worse than left renal atrophy. Normal adrenal glands. Degraded evaluation of the pelvis, secondary to beam hardening artifact from right hip arthroplasty. Moderate prostatomegaly. Fat containing left inguinal hernia. SKELETON No abnormal marrow activity. Right hip arthroplasty. L2 and L3 compression deformities without ventral canal encroachment. Seventh anterior left rib irregularity may correspond to the presumed metastasis on prior bone scan. Right proximal femoral sclerosis is also likely indicative of metastasis, given bone scan appearance. IMPRESSION: 1. Hypermetabolic adenopathy in the neck, chest, abdomen, and pelvis. Presumably  all related to lymphoma. Nodal metastasis from prostate carcinoma could look similar. 2.  Coronary artery atherosclerosis. Aortic atherosclerosis. 3. Aortic valvular calcifications. Consider echocardiography to evaluation for valvular dysfunction. 4. Presumed osseous metastasis in the proximal right femur. Electronically Signed   By: Abigail Miyamoto M.D.   On: 04/24/2016 16:53   US Biopsy  Result Date: 04/08/2016 CLINICAL DATA:  Palpable left axillary mass. History of prostate carcinoma. EXAM: ULTRASOUND GUIDED CORE BIOPSY OF LEFT AXILLARY MASS MEDICATIONS: Lidocaine 1% subcutaneous PROCEDURE: The procedure, risks, benefits, and alternatives were explained to the patient. Questions regarding the procedure were encouraged and answered. The patient understands and consents to the procedure. Survey ultrasound of the left axilla was performed and at mixed attenuation 4.6 cm mass corresponding to palpable region was localized. An appropriate skin entry site was determined and marked. The operative field was prepped with chlorhexidine in a sterile fashion, and a sterile drape was applied covering the operative field. A sterile gown and sterile gloves were used for the procedure. Local anesthesia was provided with 1% Lidocaine. Under ultrasound guidance, 18-gauge core biopsy samples were obtained, submitted in saline to surgical pathology. The guide needle was removed. Postprocedure scans show no hemorrhage or other apparent complication. The patient tolerated the procedure well. COMPLICATIONS: None. FINDINGS: 4.6 cm complex solid left axillary mass corresponding to palpable abnormality. Representative core biopsy samples obtained under ultrasound guidance. IMPRESSION: 1. Technically successful ultrasound-guided core biopsy of left axillary mass. Electronically Signed   By: Lucrezia Europe M.D.   On: 04/08/2016 11:34    ASSESSMENT: Progressive stage IV prostate cancer with bony metastasis, at least stage III diffuse large  B-cell lymphoma.  PLAN:    1. 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 Patient's most recent PSA is trending up. 2. At least stage III diffuse large B-cell lymphoma: Axillary lymph node biopsied and pathology results reviewed independently. PET scan results reviewed independently and reported as above. After lengthy discussion with the patient and his family, he does not wish to undergo chemotherapy but states he will attempt palliative treatment with Rituxan and prednisone. He expressed understanding that with the aggressiveness of his disease, single agent Rituxan may not be effective but he wishes to pursue treatment. Patient was given a prescription for 20 mg low-dose prednisone to take daily. He will return to clinic in 1 week to receive cycle 1 of 4 of weekly Rituxan.  3. Anemia: Improved, monitor.  4. Thrombocytopenia: Resolved. 5. Bony metastasis: Nuclear med bone scan results reviewed independently. Patient was distracted to keep his follow-up appointment with radiation oncology for consideration of palliative XRT to the lesion in his right leg.   Approximately 30 minutes was spent in discussion of which greater than 50% was consultation.  Patient expressed understanding and was in agreement with this plan. He also understands that He can call clinic at any time with  any questions, concerns, or complaints.   Prostate cancer metastatic to bone    Staging form: Prostate, AJCC 7th Edition     Clinical stage from 09/24/2014: Stage IV (TX, NX, M1b, PSA: 20 or greater, Gleason X - Gleason score cannot be processed) - Signed by Lloyd Huger, MD on 09/24/2014   Lloyd Huger, MD   05/03/2016 8:10 AM

## 2016-04-29 ENCOUNTER — Ambulatory Visit
Admission: RE | Admit: 2016-04-29 | Discharge: 2016-04-29 | Disposition: A | Discharge status: Home / Self Care | Payer: Medicare Other | Source: Ambulatory Visit | Attending: Radiation Oncology | Admitting: Radiation Oncology

## 2016-04-29 ENCOUNTER — Inpatient Hospital Stay: Payer: Medicare Other | Attending: Oncology | Admitting: Oncology

## 2016-04-29 VITALS — BP 137/72 | HR 70 | Temp 97.1°F | Resp 18 | Wt 181.9 lb

## 2016-04-29 DIAGNOSIS — I700 Atherosclerosis of aorta: Secondary | ICD-10-CM | POA: Diagnosis not present

## 2016-04-29 DIAGNOSIS — R5383 Other fatigue: Secondary | ICD-10-CM

## 2016-04-29 DIAGNOSIS — K4090 Unilateral inguinal hernia, without obstruction or gangrene, not specified as recurrent: Secondary | ICD-10-CM | POA: Insufficient documentation

## 2016-04-29 DIAGNOSIS — C7951 Secondary malignant neoplasm of bone: Secondary | ICD-10-CM | POA: Diagnosis not present

## 2016-04-29 DIAGNOSIS — M21372 Foot drop, left foot: Secondary | ICD-10-CM

## 2016-04-29 DIAGNOSIS — M25551 Pain in right hip: Secondary | ICD-10-CM | POA: Diagnosis not present

## 2016-04-29 DIAGNOSIS — C8330 Diffuse large B-cell lymphoma, unspecified site: Secondary | ICD-10-CM | POA: Diagnosis not present

## 2016-04-29 DIAGNOSIS — R590 Localized enlarged lymph nodes: Secondary | ICD-10-CM | POA: Insufficient documentation

## 2016-04-29 DIAGNOSIS — Z809 Family history of malignant neoplasm, unspecified: Secondary | ICD-10-CM | POA: Diagnosis not present

## 2016-04-29 DIAGNOSIS — Z5112 Encounter for antineoplastic immunotherapy: Secondary | ICD-10-CM | POA: Diagnosis not present

## 2016-04-29 DIAGNOSIS — J449 Chronic obstructive pulmonary disease, unspecified: Secondary | ICD-10-CM | POA: Diagnosis not present

## 2016-04-29 DIAGNOSIS — D649 Anemia, unspecified: Secondary | ICD-10-CM | POA: Insufficient documentation

## 2016-04-29 DIAGNOSIS — M129 Arthropathy, unspecified: Secondary | ICD-10-CM | POA: Diagnosis not present

## 2016-04-29 DIAGNOSIS — Z5111 Encounter for antineoplastic chemotherapy: Secondary | ICD-10-CM | POA: Insufficient documentation

## 2016-04-29 DIAGNOSIS — Z510 Encounter for antineoplastic radiation therapy: Secondary | ICD-10-CM | POA: Insufficient documentation

## 2016-04-29 DIAGNOSIS — J45909 Unspecified asthma, uncomplicated: Secondary | ICD-10-CM

## 2016-04-29 DIAGNOSIS — M542 Cervicalgia: Secondary | ICD-10-CM | POA: Insufficient documentation

## 2016-04-29 DIAGNOSIS — R251 Tremor, unspecified: Secondary | ICD-10-CM | POA: Diagnosis not present

## 2016-04-29 DIAGNOSIS — M549 Dorsalgia, unspecified: Secondary | ICD-10-CM | POA: Insufficient documentation

## 2016-04-29 DIAGNOSIS — R531 Weakness: Secondary | ICD-10-CM | POA: Insufficient documentation

## 2016-04-29 DIAGNOSIS — Z8781 Personal history of (healed) traumatic fracture: Secondary | ICD-10-CM | POA: Diagnosis not present

## 2016-04-29 DIAGNOSIS — G8929 Other chronic pain: Secondary | ICD-10-CM | POA: Diagnosis not present

## 2016-04-29 DIAGNOSIS — R9721 Rising PSA following treatment for malignant neoplasm of prostate: Secondary | ICD-10-CM | POA: Insufficient documentation

## 2016-04-29 DIAGNOSIS — D696 Thrombocytopenia, unspecified: Secondary | ICD-10-CM | POA: Insufficient documentation

## 2016-04-29 DIAGNOSIS — I517 Cardiomegaly: Secondary | ICD-10-CM | POA: Diagnosis not present

## 2016-04-29 DIAGNOSIS — Z79899 Other long term (current) drug therapy: Secondary | ICD-10-CM | POA: Diagnosis not present

## 2016-04-29 DIAGNOSIS — Z7952 Long term (current) use of systemic steroids: Secondary | ICD-10-CM | POA: Diagnosis not present

## 2016-04-29 DIAGNOSIS — Z800 Family history of malignant neoplasm of digestive organs: Secondary | ICD-10-CM | POA: Insufficient documentation

## 2016-04-29 DIAGNOSIS — C61 Malignant neoplasm of prostate: Secondary | ICD-10-CM

## 2016-04-29 DIAGNOSIS — C8338 Diffuse large B-cell lymphoma, lymph nodes of multiple sites: Secondary | ICD-10-CM

## 2016-04-29 MED ORDER — PREDNISONE 20 MG PO TABS: 20.0000 mg | ORAL_TABLET | Freq: Every day | ORAL | 0 refills | Status: DC

## 2016-04-29 NOTE — Consult Note (Signed)
NEW PATIENT EVALUATION  Name: Steven Greene  MRN: [id number]  Date:   04/29/2016     DOB: 1919-12-24   This 83 y.o. male patient presents to the clinic for initial evaluation of right hip pain secondary to metastatic prostate cancer stage IV.  REFERRING PHYSICIAN: Jerrol Banana.,*  CHIEF COMPLAINT: No chief complaint on file.   DIAGNOSIS: The encounter diagnosis was Bone metastasis ([place]).   PREVIOUS INVESTIGATIONS:  PET scan and bone scan reviewed Pathology report reviewed Clinical notes reviewed  HPI: Patient is a 83 year old male with a history of stage IV prostate cancer with a PSA in 57 range also has progressive diffuse large B-cell lymphoma. He's had a hip replacement in the right side has been experiencing increasing right hip pain although he treatments this will fall he had several months ago. Bone scan demonstrates abnormal uptake in the trochanteric region of his proximal right femur consistent with worsening metastatic disease. He also has an anterior portion of the left lower rib also consistent with metastatic disease. Recent PET CT scan shows hypermetabolic acting activity in the neck chest abdomen and pelvis or related to probable lymphoma. Also PET scan demonstrates presumed osseous metastatic disease in proximal right femur. Patient states his pain waxes and wanes. He doesn't takes Tylenol no narcotic analgesics for that. I been asked to evaluate him for possible palliative radiation therapy to his right hip. Recent biopsy of his left axillary node is positive for B-cell lymphoma  PLANNED TREATMENT REGIMEN: Palliative radiation therapy to right hip  PAST MEDICAL HISTORY:  has a past medical history of Arthritis; Asthma; Cancer ([place]); COPD (chronic obstructive pulmonary disease) ([place]); Foot drop, left; Hernia, inguinal, left; HOH (hard of hearing); and Tremors of nervous system.    PAST SURGICAL HISTORY:  Past Surgical History:  Procedure Laterality Date  . ANKLE  FRACTURE SURGERY  2009   lt  . ANKLE FRACTURE SURGERY     rt  . EYE SURGERY     both cataracts  . HEMORRHOID SURGERY    . JOINT REPLACEMENT  2000   rt hip replacement  . WRIST FRACTURE SURGERY Bilateral     FAMILY HISTORY: family history includes Cancer in his sister; Heart attack in his father, sister, and sister; Stomach cancer in his brother; Tremor in his father.  SOCIAL HISTORY:  reports that he has never smoked. He has never used smokeless tobacco. He reports that he does not drink alcohol or use drugs.  ALLERGIES: Patient has no known allergies.  MEDICATIONS:  Current Outpatient Prescriptions  Medication Sig Dispense Refill  . acetaminophen (TYLENOL) 500 MG tablet Take 500 mg by mouth every 6 (six) hours as needed.    . Fluticasone-Salmeterol (ADVAIR DISKUS) 100-50 MCG/DOSE AEPB Inhale 1 puff into the lungs 2 (two) times daily. (Patient taking differently: Inhale 1 puff into the lungs daily as needed. ) 60 each 12  . predniSONE (DELTASONE) 20 MG tablet Take 1 tablet (20 mg total) by mouth daily with breakfast. 30 tablet 0  . propranolol (INDERAL) 20 MG tablet Take 1 tablet (20 mg total) by mouth daily. 90 tablet 3   No current facility-administered medications for this encounter.     ECOG PERFORMANCE STATUS:  1 - Symptomatic but completely ambulatory  REVIEW OF SYSTEMS:  Patient denies any weight loss, fatigue, weakness, fever, chills or night sweats. Patient denies any loss of vision, blurred vision. Patient denies any ringing  of the ears or hearing loss. No irregular heartbeat. Patient  denies heart murmur or history of fainting. Patient denies any chest pain or pain radiating to her upper extremities. Patient denies any shortness of breath, difficulty breathing at night, cough or hemoptysis. Patient denies any swelling in the lower legs. Patient denies any nausea vomiting, vomiting of blood, or coffee ground material in the vomitus. Patient denies any stomach pain. Patient  states has had normal bowel movements no significant constipation or diarrhea. Patient denies any dysuria, hematuria or significant nocturia. Patient denies any problems walking, swelling in the joints or loss of balance. Patient denies any skin changes, loss of hair or loss of weight. Patient denies any excessive worrying or anxiety or significant depression. Patient denies any problems with insomnia. Patient denies excessive thirst, polyuria, polydipsia. Patient denies any swollen glands, patient denies easy bruising or easy bleeding. Patient denies any recent infections, allergies or URI. Patient "s visual fields have not changed significantly in recent time.    PHYSICAL EXAM: There were no vitals taken for this visit. Well-developed elderly male in NAD. Uses a walker for ambulatory assistance. Range of motion of his right hip does not elicit pain motor sensory and DTR levels are equal symmetric in the upper lower extremities. Well-developed well-nourished patient in NAD. HEENT reveals PERLA, EOMI, discs not visualized.  Oral cavity is clear. No oral mucosal lesions are identified. Neck is clear without evidence of cervical or supraclavicular adenopathy. Lungs are clear to A&P. Cardiac examination is essentially unremarkable with regular rate and rhythm without murmur rub or thrill. Abdomen is benign with no organomegaly or masses noted. Motor sensory and DTR levels are equal and symmetric in the upper and lower extremities. Cranial nerves II through XII are grossly intact. Proprioception is intact. No peripheral adenopathy or edema is identified. No motor or sensory levels are noted. Crude visual fields are within normal range.  LABORATORY DATA: Pathology reports reviewed    RADIOLOGY RESULTS: Bone scan and PET/CT scan reviewed   IMPRESSION: Metastatic disease to right hip in 83 year old male with both stage IV prostate cancer as well as progressive diffuse B-cell lymphoma  PLAN: At this time I  have offered palliative course of radiation therapy to his right hip. Would plan on delivering 3000 cGy in 10 fractions. Risks and benefits of treatment including skin reaction fatigue alteration of blood counts all were discussed in detail with the patient and his daughter. Consent was signed. I have set him up and ordered CT simulation for later this week.  I would like to take this opportunity to thank you for allowing me to participate in the care of your patient.Armstead Peaks., MD

## 2016-05-01 ENCOUNTER — Ambulatory Visit
Admission: RE | Admit: 2016-05-01 | Discharge: 2016-05-01 | Disposition: A | Discharge status: Home / Self Care | Payer: Medicare Other | Source: Ambulatory Visit | Attending: Radiation Oncology | Admitting: Radiation Oncology

## 2016-05-01 DIAGNOSIS — C8330 Diffuse large B-cell lymphoma, unspecified site: Secondary | ICD-10-CM | POA: Diagnosis not present

## 2016-05-01 DIAGNOSIS — Z800 Family history of malignant neoplasm of digestive organs: Secondary | ICD-10-CM | POA: Diagnosis not present

## 2016-05-01 DIAGNOSIS — C61 Malignant neoplasm of prostate: Secondary | ICD-10-CM | POA: Diagnosis not present

## 2016-05-01 DIAGNOSIS — J45909 Unspecified asthma, uncomplicated: Secondary | ICD-10-CM | POA: Diagnosis not present

## 2016-05-01 DIAGNOSIS — R251 Tremor, unspecified: Secondary | ICD-10-CM | POA: Diagnosis not present

## 2016-05-01 DIAGNOSIS — J449 Chronic obstructive pulmonary disease, unspecified: Secondary | ICD-10-CM | POA: Diagnosis not present

## 2016-05-01 DIAGNOSIS — Z8781 Personal history of (healed) traumatic fracture: Secondary | ICD-10-CM | POA: Diagnosis not present

## 2016-05-01 DIAGNOSIS — M129 Arthropathy, unspecified: Secondary | ICD-10-CM | POA: Diagnosis not present

## 2016-05-01 DIAGNOSIS — Z809 Family history of malignant neoplasm, unspecified: Secondary | ICD-10-CM | POA: Diagnosis not present

## 2016-05-01 DIAGNOSIS — Z79899 Other long term (current) drug therapy: Secondary | ICD-10-CM | POA: Diagnosis not present

## 2016-05-01 DIAGNOSIS — K4090 Unilateral inguinal hernia, without obstruction or gangrene, not specified as recurrent: Secondary | ICD-10-CM | POA: Diagnosis not present

## 2016-05-01 DIAGNOSIS — M21372 Foot drop, left foot: Secondary | ICD-10-CM | POA: Diagnosis not present

## 2016-05-01 DIAGNOSIS — Z510 Encounter for antineoplastic radiation therapy: Secondary | ICD-10-CM | POA: Diagnosis not present

## 2016-05-01 DIAGNOSIS — C7951 Secondary malignant neoplasm of bone: Secondary | ICD-10-CM | POA: Diagnosis not present

## 2016-05-02 ENCOUNTER — Telehealth: Payer: Self-pay | Admitting: Family Medicine

## 2016-05-02 NOTE — Telephone Encounter (Signed)
Pt's daughter called saying her dad has cancer and she wants Dr. Marlan Palau thoughts and opinion on the treatment plan.  He starts his treatments on Tuesday and she would like to talk to Dr. Darnell Level asap.  Her call back is [phone]  Thank steri

## 2016-05-02 NOTE — Telephone Encounter (Signed)
Per Karna Christmas, daughter is aware that you are out of the office on Friday's. Daughter refused OV for Monday, stating she prefers to speak to Dr. Rosanna Randy over the phone. Renaldo Fiddler, CMA

## 2016-05-03 NOTE — Progress Notes (Signed)
Patient on plan of care prior to pathways. 

## 2016-05-03 NOTE — Progress Notes (Signed)
START OFF PATHWAY REGIMEN - Lymphoma and CLL  Off Pathway: Rituximab (Weekly)  [id number]:Rituximab (Weekly):   Administer weekly:     Rituximab (Rituxan(R)) 375 mg/m2 in _____ mL NS IV days 1, 8, 15, 22.  Initiate first dose at a rate of 50 mg/hr.  In the absence of infusion toxicity, increase infusion rate by 50 mg/hr increments every 30 minutes, to a maximum of 400 mg/hr.  For  follicular and DLBC lymphoma patients see "Rapid infusion protocol" link for more information about accelerating the infusion time of rituximab. Dose Mod: None Additional Orders: Hepatitis B&C testing recommended prior to rituximab use on all patients. Final concentration of rituximab must be between 1 and 4 mg/ml.  **Always confirm dose/schedule in your pharmacy ordering system**    Patient Characteristics: Diffuse Large B Cell, First Line, Stage III and IV Disease Type: Not Applicable Disease Type: Diffuse Large B Cell Line of therapy: First Line [place] Stage: III  Intent of Therapy: Non-Curative / Palliative Intent, Discussed with Patient

## 2016-05-04 NOTE — Progress Notes (Signed)
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 His energy levels and appetite have improved since initiating prednisone last week. He continues to have right hip/leg pain. He has no neurologic complaints. He denies any recent fevers. He denies weight loss. He denies any chest pain or shortness of breath. He has no nausea, vomiting, constipation, or diarrhea. He has no urinary complaints. Patient offers no further specific complaints today.  REVIEW OF SYSTEMS:   Review of Systems  Constitutional: Negative for weight loss and malaise/fatigue.  Respiratory: Negative.   Cardiovascular: Negative.   Musculoskeletal: Positive for back pain.  Neurological: Negative for weakness.    As per HPI. Otherwise, a complete review of systems is negative.  PAST MEDICAL HISTORY: Past Medical History:  Diagnosis Date  . Arthritis   . Asthma   . Cancer [facility])    prostate cancer-treated medically  . COPD (chronic obstructive pulmonary disease) ([place])   . Foot drop, left   . Hernia, inguinal, left   . HOH (hard of hearing)   . Tremors of nervous system     PAST SURGICAL HISTORY: Past Surgical History:  Procedure Laterality Date  . ANKLE FRACTURE SURGERY  2009   lt  . ANKLE FRACTURE SURGERY     rt  . EYE SURGERY     both cataracts  . HEMORRHOID SURGERY    . JOINT REPLACEMENT  2000   rt hip replacement  . WRIST FRACTURE SURGERY Bilateral     FAMILY HISTORY: Son with T-cell lymphoma, siblings with pancreatic cancer.     ADVANCED DIRECTIVES:    HEALTH  MAINTENANCE: Social History  Substance Use Topics  . Smoking status: Never Smoker  . Smokeless tobacco: Never Used  . Alcohol use No     Colonoscopy:  PAP:  Bone density:  Lipid panel:  No Known Allergies  Current Outpatient Prescriptions  Medication Sig Dispense Refill  . acetaminophen (TYLENOL) 500 MG tablet Take 500 mg by mouth every 6 (six) hours as needed.    . Fluticasone-Salmeterol (ADVAIR DISKUS) 100-50 MCG/DOSE AEPB Inhale 1 puff into the lungs 2 (two) times daily. (Patient taking differently: Inhale 1 puff into the lungs daily as needed. ) 60 each 12  . predniSONE (DELTASONE) 20 MG tablet Take 1 tablet (20 mg total) by mouth daily with breakfast. 30 tablet 0  . propranolol (INDERAL) 20 MG tablet Take 1 tablet (20 mg total) by mouth daily. 90 tablet 3   No current facility-administered medications for this visit.     OBJECTIVE: Vitals:   05/06/16 0858  BP: 136/80  Pulse: 73  Temp: 98.2 F (36.8 C)     Body mass index is 26.11 kg/m.    ECOG FS:1 - Symptomatic but completely ambulatory  General: Well-developed, well-nourished, no acute distress. Eyes: anicteric sclera. Lungs: Clear to auscultation bilaterally. Heart: Regular rate and rhythm. No rubs, murmurs, or gallops. Abdomen: Soft, nontender, nondistended. No organomegaly noted, normoactive bowel sounds. Musculoskeletal: No edema, cyanosis or clubbing. Neuro: Alert, answering all questions appropriately. Cranial nerves grossly intact. Skin: No rashes or petechiae noted. Psych: Normal affect. Lymphatics: Easily  palpable 2 cm lymph node in left axilla.  LAB RESULTS:  Lab Results  Component Value Date   NA 136 04/02/2016   K 4.5 04/02/2016   CL 104 04/02/2016   CO2 25 04/02/2016   GLUCOSE 95 04/02/2016   BUN 17 04/02/2016   CREATININE 0.81 04/02/2016   CALCIUM 9.1 04/02/2016   PROT 7.0 04/02/2016   ALBUMIN 4.0 04/02/2016   AST 23 04/02/2016   ALT 11 (L) 04/02/2016   ALKPHOS 80 04/02/2016    BILITOT 0.6 04/02/2016   GFRNONAA >60 04/02/2016   GFRAA >60 04/02/2016    Lab Results  Component Value Date   WBC 7.3 05/06/2016   NEUTROABS 5.1 05/06/2016   HGB 12.5 (L) 05/06/2016   HCT 37.3 (L) 05/06/2016   MCV 93.9 05/06/2016   PLT 205 05/06/2016   Lab Results  Component Value Date   PSA 57.68 (H) 04/02/2016     STUDIES: Nm Bone Scan Whole Body  Result Date: 04/15/2016 CLINICAL DATA:  Prostate cancer metastatic to bone. EXAM: NUCLEAR MEDICINE WHOLE BODY BONE SCAN TECHNIQUE: Whole body anterior and posterior images were obtained approximately 3 hours after intravenous injection of radiopharmaceutical. RADIOPHARMACEUTICALS:  21.0 mCi Technetium-64m MDP IV COMPARISON:  Bone scan of Aug 16, 2014. FINDINGS: Large area of abnormal uptake involving the trochanteric region of proximal right femur which is significantly increased compared to prior exam, consistent with worsening metastatic disease. New focus of abnormal uptake seen involving anterior portion of left lower rib concerning for metastatic lesion. Abnormal uptake seen in left knee most consistent with degenerative change. IMPRESSION: Large area of abnormal uptake seen involving trochanteric region of proximal right femur consistent with worsening metastatic lesion. New focus of abnormal uptake seen involving anterior portion of left lower rib also consistent with metastatic disease. Electronically Signed   By: Marijo Conception, M.D.   On: 04/15/2016 15:31   Nm Pet Image Initial (pi) Skull Base To Thigh  Result Date: 04/24/2016 CLINICAL DATA:  Initial treatment strategy for diffuse large B-cell lymphoma of axilla. Initial staging. Metastatic prostate cancer to bone. EXAM: NUCLEAR MEDICINE PET SKULL BASE TO THIGH TECHNIQUE: 13.1 mCi F-18 FDG was injected intravenously. Full-ring PET imaging was performed from the skull base to thigh after the radiotracer. CT data was obtained and used for attenuation correction and anatomic  localization. FASTING BLOOD GLUCOSE:  Value: 110 mg/dl COMPARISON:  Bone scan of 04/15/2016. Chest radiograph of 04/12/2015. FINDINGS: NECK Small right sided cervical hypermetabolic nodes. Example right posterior triangle node which measures 8 mm and a S.U.V. max of 3.6 on image 32/series 3. Mucosal thickening of both maxillary sinuses. No cervical adenopathy. CHEST Hypermetabolic bilateral axillary bulky adenopathy. Index left axillary node measures 4.4 x 4.7 cm and a S.U.V. max of 15.3. Mediastinal and right hilar nodal hypermetabolism, including at a S.U.V. max of 5.0 within the right hilum. Retrocrural node measures 1.8 cm and a S.U.V. max of 1 9.9 on image 119/series 3. Mild cardiomegaly. Aortic valvular calcifications. Lad and probable left circumflex coronary artery atherosclerosis. Small hiatal hernia. Bibasilar scarring. ABDOMEN/PELVIS Extensive abdominopelvic hypermetabolic adenopathy. Index aortocaval node measures 1.7 cm a S.U.V. max of 11.9 on image 156/series 3. Small bowel mesenteric node measures 1.8 cm and a S.U.V. max of 10.1. Left greater than right pelvic hypermetabolic adenopathy, including a left inguinal node which measures 2.4 cm and a S.U.V. max of 16.2. Left renal cysts. Right worse than left renal atrophy. Normal adrenal glands. Degraded evaluation of the pelvis, secondary to  beam hardening artifact from right hip arthroplasty. Moderate prostatomegaly. Fat containing left inguinal hernia. SKELETON No abnormal marrow activity. Right hip arthroplasty. L2 and L3 compression deformities without ventral canal encroachment. Seventh anterior left rib irregularity may correspond to the presumed metastasis on prior bone scan. Right proximal femoral sclerosis is also likely indicative of metastasis, given bone scan appearance. IMPRESSION: 1. Hypermetabolic adenopathy in the neck, chest, abdomen, and pelvis. Presumably all related to lymphoma. Nodal metastasis from prostate carcinoma could look  similar. 2.  Coronary artery atherosclerosis. Aortic atherosclerosis. 3. Aortic valvular calcifications. Consider echocardiography to evaluation for valvular dysfunction. 4. Presumed osseous metastasis in the proximal right femur. Electronically Signed   By: Abigail Miyamoto M.D.   On: 04/24/2016 16:53   US Biopsy  Result Date: 04/08/2016 CLINICAL DATA:  Palpable left axillary mass. History of prostate carcinoma. EXAM: ULTRASOUND GUIDED CORE BIOPSY OF LEFT AXILLARY MASS MEDICATIONS: Lidocaine 1% subcutaneous PROCEDURE: The procedure, risks, benefits, and alternatives were explained to the patient. Questions regarding the procedure were encouraged and answered. The patient understands and consents to the procedure. Survey ultrasound of the left axilla was performed and at mixed attenuation 4.6 cm mass corresponding to palpable region was localized. An appropriate skin entry site was determined and marked. The operative field was prepped with chlorhexidine in a sterile fashion, and a sterile drape was applied covering the operative field. A sterile gown and sterile gloves were used for the procedure. Local anesthesia was provided with 1% Lidocaine. Under ultrasound guidance, 18-gauge core biopsy samples were obtained, submitted in saline to surgical pathology. The guide needle was removed. Postprocedure scans show no hemorrhage or other apparent complication. The patient tolerated the procedure well. COMPLICATIONS: None. FINDINGS: 4.6 cm complex solid left axillary mass corresponding to palpable abnormality. Representative core biopsy samples obtained under ultrasound guidance. IMPRESSION: 1. Technically successful ultrasound-guided core biopsy of left axillary mass. Electronically Signed   By: Lucrezia Europe M.D.   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  PLAN:    1. Progressive stage IV prostate cancer with  bony metastasis: Patient received Xgeva and 45 mg IM Lupron on April 02, 2016. Patient's most recent PSA is trending up. 2. At least stage III diffuse large B-cell lymphoma: Axillary lymph node biopsied and pathology results reviewed independently. PET scan results reviewed independently and reported as above. After lengthy discussion with the patient and his family, he does not wish to undergo chemotherapy but states he will attempt palliative treatment with Rituxan and prednisone. He expressed understanding that with the aggressiveness of his disease, single agent Rituxan may not be effective but he wishes to pursue treatment. Proceed with cycle 1 of 4 of weekly Rituxan. Continue 20 mg prednisone daily. Return to clinic in 1 week for consideration of cycle 2. 3. Anemia: Improved, monitor.  4. Thrombocytopenia: Resolved. 5. Bony metastasis: Nuclear med bone scan results reviewed independently. Patient was distracted to keep his follow-up appointment with radiation oncology for consideration of palliative XRT to the lesion in his right leg.    Patient expressed understanding and was in agreement with this plan. He also understands that He can call clinic at any time with any questions, concerns, or complaints.   Prostate cancer metastatic to bone    Staging form: Prostate, AJCC 7th Edition     Clinical stage from 09/24/2014: Stage IV (TX, NX, M1b, PSA: 20 or greater, Gleason X -  Gleason score cannot be processed) - Signed by Lloyd Huger, MD on 09/24/2014   Lloyd Huger, MD   05/07/2016 2:11 PM

## 2016-05-05 DIAGNOSIS — J449 Chronic obstructive pulmonary disease, unspecified: Secondary | ICD-10-CM | POA: Diagnosis not present

## 2016-05-05 DIAGNOSIS — M21372 Foot drop, left foot: Secondary | ICD-10-CM | POA: Diagnosis not present

## 2016-05-05 DIAGNOSIS — R251 Tremor, unspecified: Secondary | ICD-10-CM | POA: Diagnosis not present

## 2016-05-05 DIAGNOSIS — C8330 Diffuse large B-cell lymphoma, unspecified site: Secondary | ICD-10-CM | POA: Diagnosis not present

## 2016-05-05 DIAGNOSIS — Z8781 Personal history of (healed) traumatic fracture: Secondary | ICD-10-CM | POA: Diagnosis not present

## 2016-05-05 DIAGNOSIS — Z79899 Other long term (current) drug therapy: Secondary | ICD-10-CM | POA: Diagnosis not present

## 2016-05-05 DIAGNOSIS — Z800 Family history of malignant neoplasm of digestive organs: Secondary | ICD-10-CM | POA: Diagnosis not present

## 2016-05-05 DIAGNOSIS — Z510 Encounter for antineoplastic radiation therapy: Secondary | ICD-10-CM | POA: Diagnosis not present

## 2016-05-05 DIAGNOSIS — C7951 Secondary malignant neoplasm of bone: Secondary | ICD-10-CM | POA: Diagnosis not present

## 2016-05-05 DIAGNOSIS — J45909 Unspecified asthma, uncomplicated: Secondary | ICD-10-CM | POA: Diagnosis not present

## 2016-05-05 DIAGNOSIS — K4090 Unilateral inguinal hernia, without obstruction or gangrene, not specified as recurrent: Secondary | ICD-10-CM | POA: Diagnosis not present

## 2016-05-05 DIAGNOSIS — C61 Malignant neoplasm of prostate: Secondary | ICD-10-CM | POA: Diagnosis not present

## 2016-05-05 DIAGNOSIS — Z809 Family history of malignant neoplasm, unspecified: Secondary | ICD-10-CM | POA: Diagnosis not present

## 2016-05-05 DIAGNOSIS — M129 Arthropathy, unspecified: Secondary | ICD-10-CM | POA: Diagnosis not present

## 2016-05-06 ENCOUNTER — Inpatient Hospital Stay: Payer: Medicare Other

## 2016-05-06 ENCOUNTER — Inpatient Hospital Stay (HOSPITAL_BASED_OUTPATIENT_CLINIC_OR_DEPARTMENT_OTHER): Payer: Medicare Other | Admitting: Oncology

## 2016-05-06 VITALS — BP 134/69 | HR 76 | Temp 97.1°F | Resp 18

## 2016-05-06 VITALS — BP 136/80 | HR 73 | Temp 98.2°F | Ht 70.0 in | Wt 182.0 lb

## 2016-05-06 DIAGNOSIS — M21372 Foot drop, left foot: Secondary | ICD-10-CM | POA: Diagnosis not present

## 2016-05-06 DIAGNOSIS — Z79899 Other long term (current) drug therapy: Secondary | ICD-10-CM

## 2016-05-06 DIAGNOSIS — I517 Cardiomegaly: Secondary | ICD-10-CM | POA: Diagnosis not present

## 2016-05-06 DIAGNOSIS — C8330 Diffuse large B-cell lymphoma, unspecified site: Secondary | ICD-10-CM | POA: Diagnosis not present

## 2016-05-06 DIAGNOSIS — R9721 Rising PSA following treatment for malignant neoplasm of prostate: Secondary | ICD-10-CM

## 2016-05-06 DIAGNOSIS — R590 Localized enlarged lymph nodes: Secondary | ICD-10-CM | POA: Diagnosis not present

## 2016-05-06 DIAGNOSIS — G8929 Other chronic pain: Secondary | ICD-10-CM | POA: Diagnosis not present

## 2016-05-06 DIAGNOSIS — Z5112 Encounter for antineoplastic immunotherapy: Secondary | ICD-10-CM | POA: Diagnosis not present

## 2016-05-06 DIAGNOSIS — Z7952 Long term (current) use of systemic steroids: Secondary | ICD-10-CM | POA: Diagnosis not present

## 2016-05-06 DIAGNOSIS — J449 Chronic obstructive pulmonary disease, unspecified: Secondary | ICD-10-CM | POA: Diagnosis not present

## 2016-05-06 DIAGNOSIS — C61 Malignant neoplasm of prostate: Secondary | ICD-10-CM | POA: Diagnosis not present

## 2016-05-06 DIAGNOSIS — C8338 Diffuse large B-cell lymphoma, lymph nodes of multiple sites: Secondary | ICD-10-CM

## 2016-05-06 DIAGNOSIS — K4090 Unilateral inguinal hernia, without obstruction or gangrene, not specified as recurrent: Secondary | ICD-10-CM | POA: Diagnosis not present

## 2016-05-06 DIAGNOSIS — R531 Weakness: Secondary | ICD-10-CM | POA: Diagnosis not present

## 2016-05-06 DIAGNOSIS — R5383 Other fatigue: Secondary | ICD-10-CM | POA: Diagnosis not present

## 2016-05-06 DIAGNOSIS — C7951 Secondary malignant neoplasm of bone: Secondary | ICD-10-CM

## 2016-05-06 DIAGNOSIS — D696 Thrombocytopenia, unspecified: Secondary | ICD-10-CM | POA: Diagnosis not present

## 2016-05-06 DIAGNOSIS — M129 Arthropathy, unspecified: Secondary | ICD-10-CM | POA: Diagnosis not present

## 2016-05-06 DIAGNOSIS — Z5111 Encounter for antineoplastic chemotherapy: Secondary | ICD-10-CM | POA: Diagnosis not present

## 2016-05-06 DIAGNOSIS — M542 Cervicalgia: Secondary | ICD-10-CM | POA: Diagnosis not present

## 2016-05-06 DIAGNOSIS — D649 Anemia, unspecified: Secondary | ICD-10-CM

## 2016-05-06 DIAGNOSIS — M25551 Pain in right hip: Secondary | ICD-10-CM | POA: Diagnosis not present

## 2016-05-06 DIAGNOSIS — R251 Tremor, unspecified: Secondary | ICD-10-CM | POA: Diagnosis not present

## 2016-05-06 DIAGNOSIS — I700 Atherosclerosis of aorta: Secondary | ICD-10-CM | POA: Diagnosis not present

## 2016-05-06 DIAGNOSIS — M549 Dorsalgia, unspecified: Secondary | ICD-10-CM | POA: Diagnosis not present

## 2016-05-06 DIAGNOSIS — J45909 Unspecified asthma, uncomplicated: Secondary | ICD-10-CM | POA: Diagnosis not present

## 2016-05-06 LAB — CBC WITH DIFFERENTIAL/PLATELET
BASOS PCT: 1 %
Basophils Absolute: 0.1 10*3/uL (ref 0–0.1)
EOS ABS: 0.1 10*3/uL (ref 0–0.7)
Eosinophils Relative: 1 %
HCT: 37.3 % — ABNORMAL LOW (ref 40.0–52.0)
HEMOGLOBIN: 12.5 g/dL — AB (ref 13.0–18.0)
Lymphocytes Relative: 21 %
Lymphs Abs: 1.5 10*3/uL (ref 1.0–3.6)
MCH: 31.4 pg (ref 26.0–34.0)
MCHC: 33.5 g/dL (ref 32.0–36.0)
MCV: 93.9 fL (ref 80.0–100.0)
MONOS PCT: 9 %
Monocytes Absolute: 0.6 10*3/uL (ref 0.2–1.0)
NEUTROS PCT: 68 %
Neutro Abs: 5.1 10*3/uL (ref 1.4–6.5)
PLATELETS: 205 10*3/uL (ref 150–440)
RBC: 3.97 MIL/uL — ABNORMAL LOW (ref 4.40–5.90)
RDW: 14.5 % (ref 11.5–14.5)
WBC: 7.3 10*3/uL (ref 3.8–10.6)

## 2016-05-06 MED ORDER — ACETAMINOPHEN 325 MG PO TABS
650.0000 mg | ORAL_TABLET | Freq: Once | ORAL | Status: AC
  Administered 2016-05-06: 650 mg via ORAL
  Filled 2016-05-06: qty 2

## 2016-05-06 MED ORDER — DIPHENHYDRAMINE HCL 25 MG PO CAPS
25.0000 mg | ORAL_CAPSULE | Freq: Once | ORAL | Status: AC
  Administered 2016-05-06: 25 mg via ORAL
  Filled 2016-05-06: qty 1

## 2016-05-06 MED ORDER — SODIUM CHLORIDE 0.9 % IV SOLN
375.0000 mg/m2 | Freq: Once | INTRAVENOUS | Status: AC
  Administered 2016-05-06: 800 mg via INTRAVENOUS
  Filled 2016-05-06: qty 50

## 2016-05-06 MED ORDER — SODIUM CHLORIDE 0.9 % IV SOLN
Freq: Once | INTRAVENOUS | Status: AC
  Administered 2016-05-06: 10:00:00 via INTRAVENOUS
  Filled 2016-05-06: qty 1000

## 2016-05-06 NOTE — Progress Notes (Signed)
Patient here for pre treatment check. No changes since last appointment. 

## 2016-05-07 ENCOUNTER — Ambulatory Visit
Admission: RE | Admit: 2016-05-07 | Discharge: 2016-05-07 | Disposition: A | Discharge status: Home / Self Care | Payer: Medicare Other | Source: Ambulatory Visit | Attending: Radiation Oncology | Admitting: Radiation Oncology

## 2016-05-07 DIAGNOSIS — Z8781 Personal history of (healed) traumatic fracture: Secondary | ICD-10-CM | POA: Diagnosis not present

## 2016-05-07 DIAGNOSIS — Z809 Family history of malignant neoplasm, unspecified: Secondary | ICD-10-CM | POA: Diagnosis not present

## 2016-05-07 DIAGNOSIS — C8330 Diffuse large B-cell lymphoma, unspecified site: Secondary | ICD-10-CM | POA: Diagnosis not present

## 2016-05-07 DIAGNOSIS — Z510 Encounter for antineoplastic radiation therapy: Secondary | ICD-10-CM | POA: Diagnosis not present

## 2016-05-07 DIAGNOSIS — C7951 Secondary malignant neoplasm of bone: Secondary | ICD-10-CM | POA: Diagnosis not present

## 2016-05-07 DIAGNOSIS — C61 Malignant neoplasm of prostate: Secondary | ICD-10-CM | POA: Diagnosis not present

## 2016-05-07 DIAGNOSIS — M21372 Foot drop, left foot: Secondary | ICD-10-CM | POA: Diagnosis not present

## 2016-05-07 DIAGNOSIS — Z800 Family history of malignant neoplasm of digestive organs: Secondary | ICD-10-CM | POA: Diagnosis not present

## 2016-05-07 DIAGNOSIS — M129 Arthropathy, unspecified: Secondary | ICD-10-CM | POA: Diagnosis not present

## 2016-05-07 DIAGNOSIS — J45909 Unspecified asthma, uncomplicated: Secondary | ICD-10-CM | POA: Diagnosis not present

## 2016-05-07 DIAGNOSIS — K4090 Unilateral inguinal hernia, without obstruction or gangrene, not specified as recurrent: Secondary | ICD-10-CM | POA: Diagnosis not present

## 2016-05-07 DIAGNOSIS — R251 Tremor, unspecified: Secondary | ICD-10-CM | POA: Diagnosis not present

## 2016-05-07 DIAGNOSIS — Z79899 Other long term (current) drug therapy: Secondary | ICD-10-CM | POA: Diagnosis not present

## 2016-05-07 DIAGNOSIS — J449 Chronic obstructive pulmonary disease, unspecified: Secondary | ICD-10-CM | POA: Diagnosis not present

## 2016-05-07 NOTE — Progress Notes (Signed)
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  INTERVAL HISTORY: Patient returns to clinic today for further evaluation and consideration of cycle 2 of 4 of weekly Rituxan. His energy levels and appetite have improved since initiating prednisone. He continues to have right hip/leg pain. He has no neurologic complaints. He denies any recent fevers. He denies weight loss. He denies any chest pain or shortness of breath. He has no nausea, vomiting, constipation, or diarrhea. He has no urinary complaints. Patient offers no further specific complaints today.  REVIEW OF SYSTEMS:   Review of Systems  Constitutional: Negative for fever, malaise/fatigue and weight loss.  Respiratory: Negative.  Negative for cough and shortness of breath.   Cardiovascular: Negative.  Negative for chest pain and leg swelling.  Gastrointestinal: Negative.  Negative for abdominal pain.  Musculoskeletal: Positive for joint pain.  Neurological: Negative.  Negative for sensory change and weakness.  Psychiatric/Behavioral: Negative.  The patient is not nervous/anxious.     As per HPI. Otherwise, a complete review of systems is negative.  PAST MEDICAL HISTORY: Past Medical History:  Diagnosis Date  . Arthritis   . Asthma   . Cancer [facility])    prostate cancer-treated medically  . COPD (chronic obstructive pulmonary disease) ([place])   . Foot drop, left   . Hernia, inguinal, left   . HOH (hard of hearing)   . Tremors of nervous system     PAST SURGICAL HISTORY: Past Surgical History:  Procedure Laterality Date  . ANKLE FRACTURE SURGERY  2009   lt  . ANKLE FRACTURE SURGERY     rt  . EYE SURGERY     both cataracts  . HEMORRHOID SURGERY    . JOINT REPLACEMENT  2000   rt hip replacement  . WRIST FRACTURE SURGERY Bilateral     FAMILY HISTORY: Family History  Problem Relation Age of Onset  . Heart attack Father   . Tremor Father   . Cancer Sister   . Stomach cancer Brother   . Heart attack Sister   . Heart attack Sister     ADVANCED DIRECTIVES (Y/N):  N  HEALTH MAINTENANCE: Social History  Substance Use Topics  . Smoking status: Never Smoker  . Smokeless tobacco: Never Used  . Alcohol use No     Colonoscopy:  PAP:  Bone density:  Lipid panel:  No Known Allergies  Current Outpatient Prescriptions  Medication Sig Dispense Refill  . acetaminophen (TYLENOL) 500 MG tablet Take 500 mg by mouth every 6 (six) hours as needed.    . Fluticasone-Salmeterol (ADVAIR DISKUS) 100-50 MCG/DOSE AEPB Inhale 1 puff into the lungs 2 (two) times daily. (Patient taking differently: Inhale 1 puff into the lungs daily as needed. ) 60 each 12  . pantoprazole (PROTONIX) 40 MG tablet Take by mouth.    . predniSONE (DELTASONE) 20 MG tablet Take 1 tablet (20 mg total) by mouth daily with breakfast. 30 tablet 0  . propranolol (INDERAL) 20 MG tablet Take 1 tablet (20 mg total) by mouth daily. 90 tablet 3   No current facility-administered medications for this visit.     OBJECTIVE: Vitals:   05/13/16 1241  BP: (!) 141/74  Pulse: 73  Temp: (!) 96.8 F (  36 C)     Body mass index is 25.99 kg/m.    ECOG FS:1 - Symptomatic but completely ambulatory  General: Well-developed, well-nourished, no acute distress. Eyes: Pink conjunctiva, anicteric sclera. HEENT: Normocephalic, moist mucous membranes, clear oropharnyx. Lungs: Clear to auscultation bilaterally. Heart: Regular rate and rhythm. No rubs, murmurs, or gallops. Abdomen: Soft, nontender, nondistended. No organomegaly noted, normoactive bowel sounds. Musculoskeletal: No edema, cyanosis, or clubbing. Neuro: Alert, answering all questions  appropriately. Cranial nerves grossly intact. Skin: No rashes or petechiae noted. Psych: Normal affect. Lymphatics: Easily palpable left axillary lympadenopathy.  Unchanged.  LAB RESULTS:  Lab Results  Component Value Date   NA 136 04/02/2016   K 4.5 04/02/2016   CL 104 04/02/2016   CO2 25 04/02/2016   GLUCOSE 95 04/02/2016   BUN 17 04/02/2016   CREATININE 0.81 04/02/2016   CALCIUM 9.1 04/02/2016   PROT 7.0 04/02/2016   ALBUMIN 4.0 04/02/2016   AST 23 04/02/2016   ALT 11 (L) 04/02/2016   ALKPHOS 80 04/02/2016   BILITOT 0.6 04/02/2016   GFRNONAA >60 04/02/2016   GFRAA >60 04/02/2016    Lab Results  Component Value Date   WBC 8.9 05/13/2016   NEUTROABS 7.3 (H) 05/13/2016   HGB 12.9 (L) 05/13/2016   HCT 38.3 (L) 05/13/2016   MCV 91.9 05/13/2016   PLT 185 05/13/2016     STUDIES: Nm Pet Image Initial (pi) Skull Base To Thigh  Result Date: 04/24/2016 CLINICAL DATA:  Initial treatment strategy for diffuse large B-cell lymphoma of axilla. Initial staging. Metastatic prostate cancer to bone. EXAM: NUCLEAR MEDICINE PET SKULL BASE TO THIGH TECHNIQUE: 13.1 mCi F-18 FDG was injected intravenously. Full-ring PET imaging was performed from the skull base to thigh after the radiotracer. CT data was obtained and used for attenuation correction and anatomic localization. FASTING BLOOD GLUCOSE:  Value: 110 mg/dl COMPARISON:  Bone scan of 04/15/2016. Chest radiograph of 04/12/2015. FINDINGS: NECK Small right sided cervical hypermetabolic nodes. Example right posterior triangle node which measures 8 mm and a S.U.V. max of 3.6 on image 32/series 3. Mucosal thickening of both maxillary sinuses. No cervical adenopathy. CHEST Hypermetabolic bilateral axillary bulky adenopathy. Index left axillary node measures 4.4 x 4.7 cm and a S.U.V. max of 15.3. Mediastinal and right hilar nodal hypermetabolism, including at a S.U.V. max of 5.0 within the right hilum. Retrocrural node measures 1.8 cm and a S.U.V.  max of 1 9.9 on image 119/series 3. Mild cardiomegaly. Aortic valvular calcifications. Lad and probable left circumflex coronary artery atherosclerosis. Small hiatal hernia. Bibasilar scarring. ABDOMEN/PELVIS Extensive abdominopelvic hypermetabolic adenopathy. Index aortocaval node measures 1.7 cm a S.U.V. max of 11.9 on image 156/series 3. Small bowel mesenteric node measures 1.8 cm and a S.U.V. max of 10.1. Left greater than right pelvic hypermetabolic adenopathy, including a left inguinal node which measures 2.4 cm and a S.U.V. max of 16.2. Left renal cysts. Right worse than left renal atrophy. Normal adrenal glands. Degraded evaluation of the pelvis, secondary to beam hardening artifact from right hip arthroplasty. Moderate prostatomegaly. Fat containing left inguinal hernia. SKELETON No abnormal marrow activity. Right hip arthroplasty. L2 and L3 compression deformities without ventral canal encroachment. Seventh anterior left rib irregularity may correspond to the presumed metastasis on prior bone scan. Right proximal femoral sclerosis is also likely indicative of metastasis, given bone scan appearance. IMPRESSION: 1. Hypermetabolic adenopathy in the neck, chest, abdomen, and pelvis. Presumably all related to lymphoma. Nodal metastasis from prostate carcinoma could look similar. 2.  Coronary  artery atherosclerosis. Aortic atherosclerosis. 3. Aortic valvular calcifications. Consider echocardiography to evaluation for valvular dysfunction. 4. Presumed osseous metastasis in the proximal right femur. Electronically Signed   By: Abigail Miyamoto M.D.   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  PLAN:    1. 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 Patient's most recent PSA is trending up. 2. At least stage III diffuse large B-cell  lymphoma: Axillary lymph node biopsied and pathology results reviewed independently. PET scan results reviewed independently and reported as above. After lengthy discussion with the patient and his family, he does not wish to undergo chemotherapy but states he will attempt palliative treatment with Rituxan and prednisone. He expressed understanding that with the aggressiveness of his disease, single agent Rituxan may not be effective but he wishes to pursue treatment. Proceed with cycle 2 of 4 of weekly Rituxan. Continue 20 mg prednisone daily. Return to clinic in 1 week for consideration of cycle 3. 3. Anemia: Improved, monitor.  4. Thrombocytopenia: Resolved. 5. Bony metastasis: Nuclear med bone scan results reviewed independently. Patient was distracted to keep his follow-up appointment with radiation oncology for consideration of palliative XRT to the lesion in his right leg.   Patient expressed understanding and was in agreement with this plan. He also understands that He can call clinic at any time with any questions, concerns, or complaints.   Cancer Staging DLBCL (diffuse large B cell lymphoma) (HCC) Staging form: Hodgkin and Non-Hodgkin Lymphoma, AJCC 8th Edition - Clinical stage from 04/28/2016: Stage III (Diffuse large B-cell lymphoma) - Signed by Lloyd Huger, MD on 04/28/2016  Prostate cancer metastatic to bone [facility]) Staging form: Prostate, AJCC 7th Edition - Clinical stage from 09/24/2014: Stage IV (TX, NX, M1b, PSA: 20 or greater, Gleason X - Gleason score cannot be processed) - Signed by Lloyd Huger, MD on 09/24/2014   Lloyd Huger, MD   05/17/2016 8:29 AM

## 2016-05-08 ENCOUNTER — Ambulatory Visit
Admission: RE | Admit: 2016-05-08 | Discharge: 2016-05-08 | Disposition: A | Discharge status: Home / Self Care | Payer: Medicare Other | Source: Ambulatory Visit | Attending: Radiation Oncology | Admitting: Radiation Oncology

## 2016-05-08 DIAGNOSIS — Z8781 Personal history of (healed) traumatic fracture: Secondary | ICD-10-CM | POA: Diagnosis not present

## 2016-05-08 DIAGNOSIS — C61 Malignant neoplasm of prostate: Secondary | ICD-10-CM | POA: Diagnosis not present

## 2016-05-08 DIAGNOSIS — C8330 Diffuse large B-cell lymphoma, unspecified site: Secondary | ICD-10-CM | POA: Diagnosis not present

## 2016-05-08 DIAGNOSIS — M21372 Foot drop, left foot: Secondary | ICD-10-CM | POA: Diagnosis not present

## 2016-05-08 DIAGNOSIS — C7951 Secondary malignant neoplasm of bone: Secondary | ICD-10-CM | POA: Diagnosis not present

## 2016-05-08 DIAGNOSIS — R251 Tremor, unspecified: Secondary | ICD-10-CM | POA: Diagnosis not present

## 2016-05-08 DIAGNOSIS — K4090 Unilateral inguinal hernia, without obstruction or gangrene, not specified as recurrent: Secondary | ICD-10-CM | POA: Diagnosis not present

## 2016-05-08 DIAGNOSIS — Z809 Family history of malignant neoplasm, unspecified: Secondary | ICD-10-CM | POA: Diagnosis not present

## 2016-05-08 DIAGNOSIS — J45909 Unspecified asthma, uncomplicated: Secondary | ICD-10-CM | POA: Diagnosis not present

## 2016-05-08 DIAGNOSIS — Z510 Encounter for antineoplastic radiation therapy: Secondary | ICD-10-CM | POA: Diagnosis not present

## 2016-05-08 DIAGNOSIS — M129 Arthropathy, unspecified: Secondary | ICD-10-CM | POA: Diagnosis not present

## 2016-05-08 DIAGNOSIS — J449 Chronic obstructive pulmonary disease, unspecified: Secondary | ICD-10-CM | POA: Diagnosis not present

## 2016-05-08 DIAGNOSIS — Z79899 Other long term (current) drug therapy: Secondary | ICD-10-CM | POA: Diagnosis not present

## 2016-05-08 DIAGNOSIS — Z800 Family history of malignant neoplasm of digestive organs: Secondary | ICD-10-CM | POA: Diagnosis not present

## 2016-05-09 ENCOUNTER — Ambulatory Visit
Admission: RE | Admit: 2016-05-09 | Discharge: 2016-05-09 | Disposition: A | Discharge status: Home / Self Care | Payer: Medicare Other | Source: Ambulatory Visit | Attending: Radiation Oncology | Admitting: Radiation Oncology

## 2016-05-09 DIAGNOSIS — M21372 Foot drop, left foot: Secondary | ICD-10-CM | POA: Diagnosis not present

## 2016-05-09 DIAGNOSIS — R251 Tremor, unspecified: Secondary | ICD-10-CM | POA: Diagnosis not present

## 2016-05-09 DIAGNOSIS — M129 Arthropathy, unspecified: Secondary | ICD-10-CM | POA: Diagnosis not present

## 2016-05-09 DIAGNOSIS — Z79899 Other long term (current) drug therapy: Secondary | ICD-10-CM | POA: Diagnosis not present

## 2016-05-09 DIAGNOSIS — K4090 Unilateral inguinal hernia, without obstruction or gangrene, not specified as recurrent: Secondary | ICD-10-CM | POA: Diagnosis not present

## 2016-05-09 DIAGNOSIS — C61 Malignant neoplasm of prostate: Secondary | ICD-10-CM | POA: Diagnosis not present

## 2016-05-09 DIAGNOSIS — Z809 Family history of malignant neoplasm, unspecified: Secondary | ICD-10-CM | POA: Diagnosis not present

## 2016-05-09 DIAGNOSIS — Z510 Encounter for antineoplastic radiation therapy: Secondary | ICD-10-CM | POA: Diagnosis not present

## 2016-05-09 DIAGNOSIS — C8330 Diffuse large B-cell lymphoma, unspecified site: Secondary | ICD-10-CM | POA: Diagnosis not present

## 2016-05-09 DIAGNOSIS — J449 Chronic obstructive pulmonary disease, unspecified: Secondary | ICD-10-CM | POA: Diagnosis not present

## 2016-05-09 DIAGNOSIS — Z8781 Personal history of (healed) traumatic fracture: Secondary | ICD-10-CM | POA: Diagnosis not present

## 2016-05-09 DIAGNOSIS — C7951 Secondary malignant neoplasm of bone: Secondary | ICD-10-CM | POA: Diagnosis not present

## 2016-05-09 DIAGNOSIS — Z800 Family history of malignant neoplasm of digestive organs: Secondary | ICD-10-CM | POA: Diagnosis not present

## 2016-05-09 DIAGNOSIS — J45909 Unspecified asthma, uncomplicated: Secondary | ICD-10-CM | POA: Diagnosis not present

## 2016-05-12 ENCOUNTER — Ambulatory Visit
Admission: RE | Admit: 2016-05-12 | Discharge: 2016-05-12 | Disposition: A | Discharge status: Home / Self Care | Payer: Medicare Other | Source: Ambulatory Visit | Attending: Radiation Oncology | Admitting: Radiation Oncology

## 2016-05-12 DIAGNOSIS — R251 Tremor, unspecified: Secondary | ICD-10-CM | POA: Diagnosis not present

## 2016-05-12 DIAGNOSIS — K4090 Unilateral inguinal hernia, without obstruction or gangrene, not specified as recurrent: Secondary | ICD-10-CM | POA: Diagnosis not present

## 2016-05-12 DIAGNOSIS — Z79899 Other long term (current) drug therapy: Secondary | ICD-10-CM | POA: Diagnosis not present

## 2016-05-12 DIAGNOSIS — Z800 Family history of malignant neoplasm of digestive organs: Secondary | ICD-10-CM | POA: Diagnosis not present

## 2016-05-12 DIAGNOSIS — M21372 Foot drop, left foot: Secondary | ICD-10-CM | POA: Diagnosis not present

## 2016-05-12 DIAGNOSIS — Z8781 Personal history of (healed) traumatic fracture: Secondary | ICD-10-CM | POA: Diagnosis not present

## 2016-05-12 DIAGNOSIS — M129 Arthropathy, unspecified: Secondary | ICD-10-CM | POA: Diagnosis not present

## 2016-05-12 DIAGNOSIS — J449 Chronic obstructive pulmonary disease, unspecified: Secondary | ICD-10-CM | POA: Diagnosis not present

## 2016-05-12 DIAGNOSIS — C7951 Secondary malignant neoplasm of bone: Secondary | ICD-10-CM | POA: Diagnosis not present

## 2016-05-12 DIAGNOSIS — J45909 Unspecified asthma, uncomplicated: Secondary | ICD-10-CM | POA: Diagnosis not present

## 2016-05-12 DIAGNOSIS — C61 Malignant neoplasm of prostate: Secondary | ICD-10-CM | POA: Diagnosis not present

## 2016-05-12 DIAGNOSIS — C8330 Diffuse large B-cell lymphoma, unspecified site: Secondary | ICD-10-CM | POA: Diagnosis not present

## 2016-05-12 DIAGNOSIS — Z510 Encounter for antineoplastic radiation therapy: Secondary | ICD-10-CM | POA: Diagnosis not present

## 2016-05-12 DIAGNOSIS — Z809 Family history of malignant neoplasm, unspecified: Secondary | ICD-10-CM | POA: Diagnosis not present

## 2016-05-13 ENCOUNTER — Encounter: Payer: Self-pay | Admitting: Oncology

## 2016-05-13 ENCOUNTER — Inpatient Hospital Stay: Payer: Medicare Other

## 2016-05-13 ENCOUNTER — Ambulatory Visit
Admission: RE | Admit: 2016-05-13 | Discharge: 2016-05-13 | Disposition: A | Discharge status: Home / Self Care | Payer: Medicare Other | Source: Ambulatory Visit | Attending: Radiation Oncology | Admitting: Radiation Oncology

## 2016-05-13 ENCOUNTER — Inpatient Hospital Stay (HOSPITAL_BASED_OUTPATIENT_CLINIC_OR_DEPARTMENT_OTHER): Payer: Medicare Other | Admitting: Oncology

## 2016-05-13 VITALS — BP 141/74 | HR 73 | Temp 96.8°F | Wt 181.1 lb

## 2016-05-13 DIAGNOSIS — D696 Thrombocytopenia, unspecified: Secondary | ICD-10-CM | POA: Diagnosis not present

## 2016-05-13 DIAGNOSIS — R9721 Rising PSA following treatment for malignant neoplasm of prostate: Secondary | ICD-10-CM | POA: Diagnosis not present

## 2016-05-13 DIAGNOSIS — Z800 Family history of malignant neoplasm of digestive organs: Secondary | ICD-10-CM

## 2016-05-13 DIAGNOSIS — J449 Chronic obstructive pulmonary disease, unspecified: Secondary | ICD-10-CM

## 2016-05-13 DIAGNOSIS — C61 Malignant neoplasm of prostate: Secondary | ICD-10-CM | POA: Diagnosis not present

## 2016-05-13 DIAGNOSIS — I517 Cardiomegaly: Secondary | ICD-10-CM

## 2016-05-13 DIAGNOSIS — Z5112 Encounter for antineoplastic immunotherapy: Secondary | ICD-10-CM | POA: Diagnosis not present

## 2016-05-13 DIAGNOSIS — Z79899 Other long term (current) drug therapy: Secondary | ICD-10-CM

## 2016-05-13 DIAGNOSIS — M549 Dorsalgia, unspecified: Secondary | ICD-10-CM

## 2016-05-13 DIAGNOSIS — M542 Cervicalgia: Secondary | ICD-10-CM | POA: Diagnosis not present

## 2016-05-13 DIAGNOSIS — I700 Atherosclerosis of aorta: Secondary | ICD-10-CM

## 2016-05-13 DIAGNOSIS — M25551 Pain in right hip: Secondary | ICD-10-CM

## 2016-05-13 DIAGNOSIS — R531 Weakness: Secondary | ICD-10-CM | POA: Diagnosis not present

## 2016-05-13 DIAGNOSIS — R251 Tremor, unspecified: Secondary | ICD-10-CM

## 2016-05-13 DIAGNOSIS — Z7952 Long term (current) use of systemic steroids: Secondary | ICD-10-CM | POA: Diagnosis not present

## 2016-05-13 DIAGNOSIS — R590 Localized enlarged lymph nodes: Secondary | ICD-10-CM | POA: Diagnosis not present

## 2016-05-13 DIAGNOSIS — C8338 Diffuse large B-cell lymphoma, lymph nodes of multiple sites: Secondary | ICD-10-CM

## 2016-05-13 DIAGNOSIS — G8929 Other chronic pain: Secondary | ICD-10-CM | POA: Diagnosis not present

## 2016-05-13 DIAGNOSIS — J45909 Unspecified asthma, uncomplicated: Secondary | ICD-10-CM

## 2016-05-13 DIAGNOSIS — R5383 Other fatigue: Secondary | ICD-10-CM | POA: Diagnosis not present

## 2016-05-13 DIAGNOSIS — C8330 Diffuse large B-cell lymphoma, unspecified site: Secondary | ICD-10-CM

## 2016-05-13 DIAGNOSIS — K4090 Unilateral inguinal hernia, without obstruction or gangrene, not specified as recurrent: Secondary | ICD-10-CM | POA: Diagnosis not present

## 2016-05-13 DIAGNOSIS — M129 Arthropathy, unspecified: Secondary | ICD-10-CM | POA: Diagnosis not present

## 2016-05-13 DIAGNOSIS — D649 Anemia, unspecified: Secondary | ICD-10-CM | POA: Diagnosis not present

## 2016-05-13 DIAGNOSIS — C7951 Secondary malignant neoplasm of bone: Secondary | ICD-10-CM | POA: Diagnosis not present

## 2016-05-13 DIAGNOSIS — M21372 Foot drop, left foot: Secondary | ICD-10-CM

## 2016-05-13 DIAGNOSIS — Z5111 Encounter for antineoplastic chemotherapy: Secondary | ICD-10-CM | POA: Diagnosis not present

## 2016-05-13 DIAGNOSIS — Z809 Family history of malignant neoplasm, unspecified: Secondary | ICD-10-CM | POA: Diagnosis not present

## 2016-05-13 DIAGNOSIS — Z8781 Personal history of (healed) traumatic fracture: Secondary | ICD-10-CM | POA: Diagnosis not present

## 2016-05-13 DIAGNOSIS — Z510 Encounter for antineoplastic radiation therapy: Secondary | ICD-10-CM | POA: Diagnosis not present

## 2016-05-13 LAB — CBC WITH DIFFERENTIAL/PLATELET
BASOS ABS: 0 10*3/uL (ref 0–0.1)
Basophils Relative: 0 %
EOS PCT: 0 %
Eosinophils Absolute: 0 10*3/uL (ref 0–0.7)
HCT: 38.3 % — ABNORMAL LOW (ref 40.0–52.0)
Hemoglobin: 12.9 g/dL — ABNORMAL LOW (ref 13.0–18.0)
LYMPHS PCT: 10 %
Lymphs Abs: 0.9 10*3/uL — ABNORMAL LOW (ref 1.0–3.6)
MCH: 30.9 pg (ref 26.0–34.0)
MCHC: 33.6 g/dL (ref 32.0–36.0)
MCV: 91.9 fL (ref 80.0–100.0)
MONO ABS: 0.6 10*3/uL (ref 0.2–1.0)
MONOS PCT: 7 %
Neutro Abs: 7.3 10*3/uL — ABNORMAL HIGH (ref 1.4–6.5)
Neutrophils Relative %: 83 %
PLATELETS: 185 10*3/uL (ref 150–440)
RBC: 4.17 MIL/uL — ABNORMAL LOW (ref 4.40–5.90)
RDW: 14.8 % — AB (ref 11.5–14.5)
WBC: 8.9 10*3/uL (ref 3.8–10.6)

## 2016-05-13 MED ORDER — SODIUM CHLORIDE 0.9 % IV SOLN: 375.0000 mg/m2 | Freq: Once | INTRAVENOUS | Status: DC

## 2016-05-13 MED ORDER — SODIUM CHLORIDE 0.9 % IV SOLN
375.0000 mg/m2 | Freq: Once | INTRAVENOUS | Status: AC
  Administered 2016-05-13: 800 mg via INTRAVENOUS
  Filled 2016-05-13: qty 50

## 2016-05-13 MED ORDER — DIPHENHYDRAMINE HCL 25 MG PO CAPS
25.0000 mg | ORAL_CAPSULE | Freq: Once | ORAL | Status: AC
  Administered 2016-05-13: 25 mg via ORAL
  Filled 2016-05-13: qty 1

## 2016-05-13 MED ORDER — SODIUM CHLORIDE 0.9 % IV SOLN
Freq: Once | INTRAVENOUS | Status: AC
  Administered 2016-05-13: 12:00:00 via INTRAVENOUS
  Filled 2016-05-13: qty 1000

## 2016-05-13 MED ORDER — ACETAMINOPHEN 325 MG PO TABS
650.0000 mg | ORAL_TABLET | Freq: Once | ORAL | Status: AC
  Administered 2016-05-13: 650 mg via ORAL
  Filled 2016-05-13: qty 2

## 2016-05-14 ENCOUNTER — Ambulatory Visit
Admission: RE | Admit: 2016-05-14 | Discharge: 2016-05-14 | Disposition: A | Discharge status: Home / Self Care | Payer: Medicare Other | Source: Ambulatory Visit | Attending: Radiation Oncology | Admitting: Radiation Oncology

## 2016-05-14 DIAGNOSIS — K4090 Unilateral inguinal hernia, without obstruction or gangrene, not specified as recurrent: Secondary | ICD-10-CM | POA: Diagnosis not present

## 2016-05-14 DIAGNOSIS — Z8781 Personal history of (healed) traumatic fracture: Secondary | ICD-10-CM | POA: Diagnosis not present

## 2016-05-14 DIAGNOSIS — Z809 Family history of malignant neoplasm, unspecified: Secondary | ICD-10-CM | POA: Diagnosis not present

## 2016-05-14 DIAGNOSIS — Z510 Encounter for antineoplastic radiation therapy: Secondary | ICD-10-CM | POA: Diagnosis not present

## 2016-05-14 DIAGNOSIS — M129 Arthropathy, unspecified: Secondary | ICD-10-CM | POA: Diagnosis not present

## 2016-05-14 DIAGNOSIS — M21372 Foot drop, left foot: Secondary | ICD-10-CM | POA: Diagnosis not present

## 2016-05-14 DIAGNOSIS — J449 Chronic obstructive pulmonary disease, unspecified: Secondary | ICD-10-CM | POA: Diagnosis not present

## 2016-05-14 DIAGNOSIS — R251 Tremor, unspecified: Secondary | ICD-10-CM | POA: Diagnosis not present

## 2016-05-14 DIAGNOSIS — C8330 Diffuse large B-cell lymphoma, unspecified site: Secondary | ICD-10-CM | POA: Diagnosis not present

## 2016-05-14 DIAGNOSIS — Z800 Family history of malignant neoplasm of digestive organs: Secondary | ICD-10-CM | POA: Diagnosis not present

## 2016-05-14 DIAGNOSIS — C61 Malignant neoplasm of prostate: Secondary | ICD-10-CM | POA: Diagnosis not present

## 2016-05-14 DIAGNOSIS — J45909 Unspecified asthma, uncomplicated: Secondary | ICD-10-CM | POA: Diagnosis not present

## 2016-05-14 DIAGNOSIS — C7951 Secondary malignant neoplasm of bone: Secondary | ICD-10-CM | POA: Diagnosis not present

## 2016-05-14 DIAGNOSIS — Z79899 Other long term (current) drug therapy: Secondary | ICD-10-CM | POA: Diagnosis not present

## 2016-05-15 ENCOUNTER — Ambulatory Visit
Admission: RE | Admit: 2016-05-15 | Discharge: 2016-05-15 | Disposition: A | Discharge status: Home / Self Care | Payer: Medicare Other | Source: Ambulatory Visit | Attending: Radiation Oncology | Admitting: Radiation Oncology

## 2016-05-15 DIAGNOSIS — J45909 Unspecified asthma, uncomplicated: Secondary | ICD-10-CM | POA: Diagnosis not present

## 2016-05-15 DIAGNOSIS — Z510 Encounter for antineoplastic radiation therapy: Secondary | ICD-10-CM | POA: Diagnosis not present

## 2016-05-15 DIAGNOSIS — K4090 Unilateral inguinal hernia, without obstruction or gangrene, not specified as recurrent: Secondary | ICD-10-CM | POA: Diagnosis not present

## 2016-05-15 DIAGNOSIS — Z809 Family history of malignant neoplasm, unspecified: Secondary | ICD-10-CM | POA: Diagnosis not present

## 2016-05-15 DIAGNOSIS — J449 Chronic obstructive pulmonary disease, unspecified: Secondary | ICD-10-CM | POA: Diagnosis not present

## 2016-05-15 DIAGNOSIS — R251 Tremor, unspecified: Secondary | ICD-10-CM | POA: Diagnosis not present

## 2016-05-15 DIAGNOSIS — M21372 Foot drop, left foot: Secondary | ICD-10-CM | POA: Diagnosis not present

## 2016-05-15 DIAGNOSIS — Z800 Family history of malignant neoplasm of digestive organs: Secondary | ICD-10-CM | POA: Diagnosis not present

## 2016-05-15 DIAGNOSIS — Z79899 Other long term (current) drug therapy: Secondary | ICD-10-CM | POA: Diagnosis not present

## 2016-05-15 DIAGNOSIS — C61 Malignant neoplasm of prostate: Secondary | ICD-10-CM | POA: Diagnosis not present

## 2016-05-15 DIAGNOSIS — C7951 Secondary malignant neoplasm of bone: Secondary | ICD-10-CM | POA: Diagnosis not present

## 2016-05-15 DIAGNOSIS — M129 Arthropathy, unspecified: Secondary | ICD-10-CM | POA: Diagnosis not present

## 2016-05-15 DIAGNOSIS — Z8781 Personal history of (healed) traumatic fracture: Secondary | ICD-10-CM | POA: Diagnosis not present

## 2016-05-15 DIAGNOSIS — C8330 Diffuse large B-cell lymphoma, unspecified site: Secondary | ICD-10-CM | POA: Diagnosis not present

## 2016-05-16 ENCOUNTER — Ambulatory Visit
Admission: RE | Admit: 2016-05-16 | Discharge: 2016-05-16 | Disposition: A | Discharge status: Home / Self Care | Payer: Medicare Other | Source: Ambulatory Visit | Attending: Radiation Oncology | Admitting: Radiation Oncology

## 2016-05-16 DIAGNOSIS — Z809 Family history of malignant neoplasm, unspecified: Secondary | ICD-10-CM | POA: Diagnosis not present

## 2016-05-16 DIAGNOSIS — Z800 Family history of malignant neoplasm of digestive organs: Secondary | ICD-10-CM | POA: Diagnosis not present

## 2016-05-16 DIAGNOSIS — M21372 Foot drop, left foot: Secondary | ICD-10-CM | POA: Diagnosis not present

## 2016-05-16 DIAGNOSIS — J45909 Unspecified asthma, uncomplicated: Secondary | ICD-10-CM | POA: Diagnosis not present

## 2016-05-16 DIAGNOSIS — R251 Tremor, unspecified: Secondary | ICD-10-CM | POA: Diagnosis not present

## 2016-05-16 DIAGNOSIS — C7951 Secondary malignant neoplasm of bone: Secondary | ICD-10-CM | POA: Diagnosis not present

## 2016-05-16 DIAGNOSIS — M129 Arthropathy, unspecified: Secondary | ICD-10-CM | POA: Diagnosis not present

## 2016-05-16 DIAGNOSIS — K4090 Unilateral inguinal hernia, without obstruction or gangrene, not specified as recurrent: Secondary | ICD-10-CM | POA: Diagnosis not present

## 2016-05-16 DIAGNOSIS — Z510 Encounter for antineoplastic radiation therapy: Secondary | ICD-10-CM | POA: Diagnosis not present

## 2016-05-16 DIAGNOSIS — C8330 Diffuse large B-cell lymphoma, unspecified site: Secondary | ICD-10-CM | POA: Diagnosis not present

## 2016-05-16 DIAGNOSIS — J449 Chronic obstructive pulmonary disease, unspecified: Secondary | ICD-10-CM | POA: Diagnosis not present

## 2016-05-16 DIAGNOSIS — C61 Malignant neoplasm of prostate: Secondary | ICD-10-CM | POA: Diagnosis not present

## 2016-05-16 DIAGNOSIS — Z79899 Other long term (current) drug therapy: Secondary | ICD-10-CM | POA: Diagnosis not present

## 2016-05-16 DIAGNOSIS — Z8781 Personal history of (healed) traumatic fracture: Secondary | ICD-10-CM | POA: Diagnosis not present

## 2016-05-19 ENCOUNTER — Ambulatory Visit
Admission: RE | Admit: 2016-05-19 | Discharge: 2016-05-19 | Disposition: A | Discharge status: Home / Self Care | Payer: Medicare Other | Source: Ambulatory Visit | Attending: Radiation Oncology | Admitting: Radiation Oncology

## 2016-05-19 DIAGNOSIS — C7951 Secondary malignant neoplasm of bone: Secondary | ICD-10-CM | POA: Diagnosis not present

## 2016-05-19 DIAGNOSIS — Z800 Family history of malignant neoplasm of digestive organs: Secondary | ICD-10-CM | POA: Diagnosis not present

## 2016-05-19 DIAGNOSIS — C61 Malignant neoplasm of prostate: Secondary | ICD-10-CM | POA: Diagnosis not present

## 2016-05-19 DIAGNOSIS — Z79899 Other long term (current) drug therapy: Secondary | ICD-10-CM | POA: Diagnosis not present

## 2016-05-19 DIAGNOSIS — M129 Arthropathy, unspecified: Secondary | ICD-10-CM | POA: Diagnosis not present

## 2016-05-19 DIAGNOSIS — M21372 Foot drop, left foot: Secondary | ICD-10-CM | POA: Diagnosis not present

## 2016-05-19 DIAGNOSIS — J449 Chronic obstructive pulmonary disease, unspecified: Secondary | ICD-10-CM | POA: Diagnosis not present

## 2016-05-19 DIAGNOSIS — R251 Tremor, unspecified: Secondary | ICD-10-CM | POA: Diagnosis not present

## 2016-05-19 DIAGNOSIS — K4090 Unilateral inguinal hernia, without obstruction or gangrene, not specified as recurrent: Secondary | ICD-10-CM | POA: Diagnosis not present

## 2016-05-19 DIAGNOSIS — Z809 Family history of malignant neoplasm, unspecified: Secondary | ICD-10-CM | POA: Diagnosis not present

## 2016-05-19 DIAGNOSIS — J45909 Unspecified asthma, uncomplicated: Secondary | ICD-10-CM | POA: Diagnosis not present

## 2016-05-19 DIAGNOSIS — C8330 Diffuse large B-cell lymphoma, unspecified site: Secondary | ICD-10-CM | POA: Diagnosis not present

## 2016-05-19 DIAGNOSIS — Z8781 Personal history of (healed) traumatic fracture: Secondary | ICD-10-CM | POA: Diagnosis not present

## 2016-05-19 DIAGNOSIS — Z510 Encounter for antineoplastic radiation therapy: Secondary | ICD-10-CM | POA: Diagnosis not present

## 2016-05-19 NOTE — Progress Notes (Signed)
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  INTERVAL HISTORY: Patient returns to clinic today for further evaluation and consideration of cycle 3 of 4 of weekly Rituxan. His energy levels and appetite have improved since initiating prednisone. His right leg/hip pain has improved. The enlarged lymph node in his left axilla is unchanged. He has no neurologic complaints. He denies any recent fevers. He denies weight loss. He denies any chest pain or shortness of breath. He has no nausea, vomiting, constipation, or diarrhea. He has no urinary complaints. Patient offers no specific complaints today.  REVIEW OF SYSTEMS:   Review of Systems  Constitutional: Negative for fever, malaise/fatigue and weight loss.  Respiratory: Negative.  Negative for cough and shortness of breath.   Cardiovascular: Negative.  Negative for chest pain and leg swelling.  Gastrointestinal: Negative.  Negative for abdominal pain.  Musculoskeletal: Negative.  Negative for joint pain.  Neurological: Negative.  Negative for sensory change and weakness.  Psychiatric/Behavioral: Negative.  The patient is not nervous/anxious.     As per HPI. Otherwise, a complete review of systems is negative.  PAST MEDICAL HISTORY: Past Medical History:  Diagnosis Date  . Arthritis   . Asthma   . Cancer [facility])    prostate cancer-treated medically  . COPD (chronic obstructive pulmonary disease) ([place])   . Foot drop, left   . Hernia, inguinal, left   . HOH (hard of hearing)   . Tremors of nervous system     PAST SURGICAL HISTORY: Past Surgical History:  Procedure Laterality Date  . ANKLE FRACTURE SURGERY  2009   lt    . ANKLE FRACTURE SURGERY     rt  . EYE SURGERY     both cataracts  . HEMORRHOID SURGERY    . JOINT REPLACEMENT  2000   rt hip replacement  . WRIST FRACTURE SURGERY Bilateral     FAMILY HISTORY: Family History  Problem Relation Age of Onset  . Heart attack Father   . Tremor Father   . Cancer Sister   . Stomach cancer Brother   . Heart attack Sister   . Heart attack Sister     ADVANCED DIRECTIVES (Y/N):  N  HEALTH MAINTENANCE: Social History  Substance Use Topics  . Smoking status: Never Smoker  . Smokeless tobacco: Never Used  . Alcohol use No     Colonoscopy:  PAP:  Bone density:  Lipid panel:  No Known Allergies  Current Outpatient Prescriptions  Medication Sig Dispense Refill  . acetaminophen (TYLENOL) 500 MG tablet Take 500 mg by mouth every 6 (six) hours as needed.    . Fluticasone-Salmeterol (ADVAIR DISKUS) 100-50 MCG/DOSE AEPB Inhale 1 puff into the lungs 2 (two) times daily. (Patient taking differently: Inhale 1 puff into the lungs daily as needed. ) 60 each 12  . pantoprazole (PROTONIX) 40 MG tablet Take by mouth.    . predniSONE (DELTASONE) 20 MG tablet Take 1 tablet (20 mg total) by mouth daily with breakfast. 30 tablet 0  . propranolol (INDERAL) 20 MG tablet Take 1 tablet (20 mg total) by mouth daily. 90 tablet 3   No current facility-administered medications for this visit.     OBJECTIVE: Vitals:   05/20/16 1005  BP: (!) 152/83  Pulse: 80  Resp: 18  Temp: (!) 96.7 F (35.9 C)     Body mass index is 26.07 kg/m.    ECOG FS:1 - Symptomatic but completely ambulatory  General: Well-developed, well-nourished, no acute distress. Eyes: Pink conjunctiva, anicteric sclera. HEENT: Normocephalic, moist mucous membranes, clear oropharnyx. Lungs: Clear to auscultation bilaterally. Heart: Regular rate and rhythm. No rubs, murmurs, or gallops. Abdomen: Soft, nontender, nondistended. No organomegaly noted, normoactive bowel sounds. Musculoskeletal: No  edema, cyanosis, or clubbing. Neuro: Alert, answering all questions appropriately. Cranial nerves grossly intact. Skin: No rashes or petechiae noted. Psych: Normal affect. Lymphatics: Easily palpable left axillary lympadenopathy.  Unchanged.  LAB RESULTS:  Lab Results  Component Value Date   NA 136 04/02/2016   K 4.5 04/02/2016   CL 104 04/02/2016   CO2 25 04/02/2016   GLUCOSE 95 04/02/2016   BUN 17 04/02/2016   CREATININE 0.81 04/02/2016   CALCIUM 9.1 04/02/2016   PROT 7.0 04/02/2016   ALBUMIN 4.0 04/02/2016   AST 23 04/02/2016   ALT 11 (L) 04/02/2016   ALKPHOS 80 04/02/2016   BILITOT 0.6 04/02/2016   GFRNONAA >60 04/02/2016   GFRAA >60 04/02/2016    Lab Results  Component Value Date   WBC 8.9 05/20/2016   NEUTROABS 7.3 (H) 05/20/2016   HGB 12.9 (L) 05/20/2016   HCT 38.6 (L) 05/20/2016   MCV 93.2 05/20/2016   PLT 158 05/20/2016     STUDIES: Nm Pet Image Initial (pi) Skull Base To Thigh  Result Date: 04/24/2016 CLINICAL DATA:  Initial treatment strategy for diffuse large B-cell lymphoma of axilla. Initial staging. Metastatic prostate cancer to bone. EXAM: NUCLEAR MEDICINE PET SKULL BASE TO THIGH TECHNIQUE: 13.1 mCi F-18 FDG was injected intravenously. Full-ring PET imaging was performed from the skull base to thigh after the radiotracer. CT data was obtained and used for attenuation correction and anatomic localization. FASTING BLOOD GLUCOSE:  Value: 110 mg/dl COMPARISON:  Bone scan of 04/15/2016. Chest radiograph of 04/12/2015. FINDINGS: NECK Small right sided cervical hypermetabolic nodes. Example right posterior triangle node which measures 8 mm and a S.U.V. max of 3.6 on image 32/series 3. Mucosal thickening of both maxillary sinuses. No cervical adenopathy. CHEST Hypermetabolic bilateral axillary bulky adenopathy. Index left axillary node measures 4.4 x 4.7 cm and a S.U.V. max of 15.3. Mediastinal and right hilar nodal hypermetabolism, including at a S.U.V. max of 5.0  within the right hilum. Retrocrural node measures 1.8 cm and a S.U.V. max of 1 9.9 on image 119/series 3. Mild cardiomegaly. Aortic valvular calcifications. Lad and probable left circumflex coronary artery atherosclerosis. Small hiatal hernia. Bibasilar scarring. ABDOMEN/PELVIS Extensive abdominopelvic hypermetabolic adenopathy. Index aortocaval node measures 1.7 cm a S.U.V. max of 11.9 on image 156/series 3. Small bowel mesenteric node measures 1.8 cm and a S.U.V. max of 10.1. Left greater than right pelvic hypermetabolic adenopathy, including a left inguinal node which measures 2.4 cm and a S.U.V. max of 16.2. Left renal cysts. Right worse than left renal atrophy. Normal adrenal glands. Degraded evaluation of the pelvis, secondary to beam hardening artifact from right hip arthroplasty. Moderate prostatomegaly. Fat containing left inguinal hernia. SKELETON No abnormal marrow activity. Right hip arthroplasty. L2 and L3 compression deformities without ventral canal encroachment. Seventh anterior left rib irregularity may correspond to the presumed metastasis on prior bone scan. Right proximal femoral sclerosis is also likely indicative of metastasis, given bone scan appearance. IMPRESSION: 1. Hypermetabolic adenopathy in the neck, chest, abdomen, and pelvis. Presumably all  related to lymphoma. Nodal metastasis from prostate carcinoma could look similar. 2.  Coronary artery atherosclerosis. Aortic atherosclerosis. 3. Aortic valvular calcifications. Consider echocardiography to evaluation for valvular dysfunction. 4. Presumed osseous metastasis in the proximal right femur. Electronically Signed   By: Abigail Miyamoto M.D.   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  PLAN:    1. 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 Patient's most  recent PSA is trending up. 2. At least stage III diffuse large B-cell lymphoma: Axillary lymph node biopsied and pathology results reviewed independently. PET scan results reviewed independently. After lengthy discussion with the patient and his family, he does not wish to undergo chemotherapy but states he will attempt palliative treatment with Rituxan and prednisone. He expressed understanding that with the aggressiveness of his disease, single agent Rituxan may not be effective but he wishes to pursue treatment. Proceed with cycle 3 of 4 of weekly Rituxan. Continue 20 mg prednisone daily. Return to clinic in 1 week for consideration of cycle 4. Will likely reimage approximately 4-6 weeks after the conclusion of cycle 4. 3. Anemia: Improved, monitor.  4. Thrombocytopenia: Resolved. 5. Bony metastasis: Nuclear med bone scan results reviewed independently. Continue palliative XRT to the lesion in his right leg.   Patient expressed understanding and was in agreement with this plan. He also understands that He can call clinic at any time with any questions, concerns, or complaints.   Cancer Staging DLBCL (diffuse large B cell lymphoma) (HCC) Staging form: Hodgkin and Non-Hodgkin Lymphoma, AJCC 8th Edition - Clinical stage from 04/28/2016: Stage III (Diffuse large B-cell lymphoma) - Signed by Lloyd Huger, MD on 04/28/2016  Prostate cancer metastatic to bone [facility]) Staging form: Prostate, AJCC 7th Edition - Clinical stage from 09/24/2014: Stage IV (TX, NX, M1b, PSA: 20 or greater, Gleason X - Gleason score cannot be processed) - Signed by Lloyd Huger, MD on 09/24/2014   Lloyd Huger, MD   05/23/2016 1:27 PM

## 2016-05-20 ENCOUNTER — Inpatient Hospital Stay: Payer: Medicare Other

## 2016-05-20 ENCOUNTER — Ambulatory Visit
Admission: RE | Admit: 2016-05-20 | Discharge: 2016-05-20 | Disposition: A | Discharge status: Home / Self Care | Payer: Medicare Other | Source: Ambulatory Visit | Attending: Radiation Oncology | Admitting: Radiation Oncology

## 2016-05-20 ENCOUNTER — Inpatient Hospital Stay (HOSPITAL_BASED_OUTPATIENT_CLINIC_OR_DEPARTMENT_OTHER): Payer: Medicare Other | Admitting: Oncology

## 2016-05-20 VITALS — BP 152/83 | HR 80 | Temp 96.7°F | Resp 18 | Wt 181.7 lb

## 2016-05-20 DIAGNOSIS — C8338 Diffuse large B-cell lymphoma, lymph nodes of multiple sites: Secondary | ICD-10-CM

## 2016-05-20 DIAGNOSIS — Z79899 Other long term (current) drug therapy: Secondary | ICD-10-CM | POA: Diagnosis not present

## 2016-05-20 DIAGNOSIS — R531 Weakness: Secondary | ICD-10-CM | POA: Diagnosis not present

## 2016-05-20 DIAGNOSIS — M549 Dorsalgia, unspecified: Secondary | ICD-10-CM | POA: Diagnosis not present

## 2016-05-20 DIAGNOSIS — R9721 Rising PSA following treatment for malignant neoplasm of prostate: Secondary | ICD-10-CM

## 2016-05-20 DIAGNOSIS — Z7952 Long term (current) use of systemic steroids: Secondary | ICD-10-CM | POA: Diagnosis not present

## 2016-05-20 DIAGNOSIS — Z5111 Encounter for antineoplastic chemotherapy: Secondary | ICD-10-CM | POA: Diagnosis not present

## 2016-05-20 DIAGNOSIS — C8330 Diffuse large B-cell lymphoma, unspecified site: Secondary | ICD-10-CM | POA: Diagnosis not present

## 2016-05-20 DIAGNOSIS — I700 Atherosclerosis of aorta: Secondary | ICD-10-CM | POA: Diagnosis not present

## 2016-05-20 DIAGNOSIS — R590 Localized enlarged lymph nodes: Secondary | ICD-10-CM | POA: Diagnosis not present

## 2016-05-20 DIAGNOSIS — M542 Cervicalgia: Secondary | ICD-10-CM | POA: Diagnosis not present

## 2016-05-20 DIAGNOSIS — J45909 Unspecified asthma, uncomplicated: Secondary | ICD-10-CM | POA: Diagnosis not present

## 2016-05-20 DIAGNOSIS — Z510 Encounter for antineoplastic radiation therapy: Secondary | ICD-10-CM | POA: Diagnosis not present

## 2016-05-20 DIAGNOSIS — C7951 Secondary malignant neoplasm of bone: Secondary | ICD-10-CM

## 2016-05-20 DIAGNOSIS — R251 Tremor, unspecified: Secondary | ICD-10-CM | POA: Diagnosis not present

## 2016-05-20 DIAGNOSIS — D649 Anemia, unspecified: Secondary | ICD-10-CM | POA: Diagnosis not present

## 2016-05-20 DIAGNOSIS — M129 Arthropathy, unspecified: Secondary | ICD-10-CM

## 2016-05-20 DIAGNOSIS — R5383 Other fatigue: Secondary | ICD-10-CM | POA: Diagnosis not present

## 2016-05-20 DIAGNOSIS — Z5112 Encounter for antineoplastic immunotherapy: Secondary | ICD-10-CM | POA: Diagnosis not present

## 2016-05-20 DIAGNOSIS — K4090 Unilateral inguinal hernia, without obstruction or gangrene, not specified as recurrent: Secondary | ICD-10-CM

## 2016-05-20 DIAGNOSIS — M25551 Pain in right hip: Secondary | ICD-10-CM | POA: Diagnosis not present

## 2016-05-20 DIAGNOSIS — G8929 Other chronic pain: Secondary | ICD-10-CM

## 2016-05-20 DIAGNOSIS — Z809 Family history of malignant neoplasm, unspecified: Secondary | ICD-10-CM | POA: Diagnosis not present

## 2016-05-20 DIAGNOSIS — Z8781 Personal history of (healed) traumatic fracture: Secondary | ICD-10-CM | POA: Diagnosis not present

## 2016-05-20 DIAGNOSIS — C61 Malignant neoplasm of prostate: Secondary | ICD-10-CM | POA: Diagnosis not present

## 2016-05-20 DIAGNOSIS — Z800 Family history of malignant neoplasm of digestive organs: Secondary | ICD-10-CM | POA: Diagnosis not present

## 2016-05-20 DIAGNOSIS — I517 Cardiomegaly: Secondary | ICD-10-CM | POA: Diagnosis not present

## 2016-05-20 DIAGNOSIS — J449 Chronic obstructive pulmonary disease, unspecified: Secondary | ICD-10-CM | POA: Diagnosis not present

## 2016-05-20 DIAGNOSIS — D696 Thrombocytopenia, unspecified: Secondary | ICD-10-CM | POA: Diagnosis not present

## 2016-05-20 DIAGNOSIS — M21372 Foot drop, left foot: Secondary | ICD-10-CM

## 2016-05-20 LAB — CBC WITH DIFFERENTIAL/PLATELET
Basophils Absolute: 0 K/uL (ref 0–0.1)
Basophils Relative: 0 %
Eosinophils Absolute: 0.1 K/uL (ref 0–0.7)
Eosinophils Relative: 1 %
HCT: 38.6 % — ABNORMAL LOW (ref 40.0–52.0)
Hemoglobin: 12.9 g/dL — ABNORMAL LOW (ref 13.0–18.0)
Lymphocytes Relative: 10 %
Lymphs Abs: 0.9 K/uL — ABNORMAL LOW (ref 1.0–3.6)
MCH: 31.2 pg (ref 26.0–34.0)
MCHC: 33.5 g/dL (ref 32.0–36.0)
MCV: 93.2 fL (ref 80.0–100.0)
Monocytes Absolute: 0.6 K/uL (ref 0.2–1.0)
Monocytes Relative: 7 %
Neutro Abs: 7.3 K/uL — ABNORMAL HIGH (ref 1.4–6.5)
Neutrophils Relative %: 82 %
Platelets: 158 K/uL (ref 150–440)
RBC: 4.15 MIL/uL — ABNORMAL LOW (ref 4.40–5.90)
RDW: 14.8 % — ABNORMAL HIGH (ref 11.5–14.5)
WBC: 8.9 K/uL (ref 3.8–10.6)

## 2016-05-20 MED ORDER — SODIUM CHLORIDE 0.9 % IV SOLN: 375.0000 mg/m2 | Freq: Once | INTRAVENOUS | Status: DC

## 2016-05-20 MED ORDER — DIPHENHYDRAMINE HCL 25 MG PO CAPS
25.0000 mg | ORAL_CAPSULE | Freq: Once | ORAL | Status: AC
  Administered 2016-05-20: 25 mg via ORAL
  Filled 2016-05-20: qty 1

## 2016-05-20 MED ORDER — SODIUM CHLORIDE 0.9 % IV SOLN
Freq: Once | INTRAVENOUS | Status: AC
  Administered 2016-05-20: 11:00:00 via INTRAVENOUS
  Filled 2016-05-20: qty 1000

## 2016-05-20 MED ORDER — SODIUM CHLORIDE 0.9 % IV SOLN
375.0000 mg/m2 | Freq: Once | INTRAVENOUS | Status: AC
  Administered 2016-05-20: 800 mg via INTRAVENOUS
  Filled 2016-05-20: qty 50

## 2016-05-20 MED ORDER — ACETAMINOPHEN 325 MG PO TABS
650.0000 mg | ORAL_TABLET | Freq: Once | ORAL | Status: AC
  Administered 2016-05-20: 650 mg via ORAL
  Filled 2016-05-20: qty 2

## 2016-05-20 NOTE — Progress Notes (Signed)
Patient wants to know how long he is supposed to take his Prednisone.  States he was given 30 day supply with no refills.

## 2016-05-21 ENCOUNTER — Ambulatory Visit
Admission: RE | Admit: 2016-05-21 | Discharge: 2016-05-21 | Disposition: A | Discharge status: Home / Self Care | Payer: Medicare Other | Source: Ambulatory Visit | Attending: Radiation Oncology | Admitting: Radiation Oncology

## 2016-05-21 DIAGNOSIS — Z800 Family history of malignant neoplasm of digestive organs: Secondary | ICD-10-CM | POA: Diagnosis not present

## 2016-05-21 DIAGNOSIS — Z510 Encounter for antineoplastic radiation therapy: Secondary | ICD-10-CM | POA: Diagnosis not present

## 2016-05-21 DIAGNOSIS — Z79899 Other long term (current) drug therapy: Secondary | ICD-10-CM | POA: Diagnosis not present

## 2016-05-21 DIAGNOSIS — M21372 Foot drop, left foot: Secondary | ICD-10-CM | POA: Diagnosis not present

## 2016-05-21 DIAGNOSIS — Z8781 Personal history of (healed) traumatic fracture: Secondary | ICD-10-CM | POA: Diagnosis not present

## 2016-05-21 DIAGNOSIS — R251 Tremor, unspecified: Secondary | ICD-10-CM | POA: Diagnosis not present

## 2016-05-21 DIAGNOSIS — C7951 Secondary malignant neoplasm of bone: Secondary | ICD-10-CM | POA: Diagnosis not present

## 2016-05-21 DIAGNOSIS — K4090 Unilateral inguinal hernia, without obstruction or gangrene, not specified as recurrent: Secondary | ICD-10-CM | POA: Diagnosis not present

## 2016-05-21 DIAGNOSIS — M129 Arthropathy, unspecified: Secondary | ICD-10-CM | POA: Diagnosis not present

## 2016-05-21 DIAGNOSIS — Z809 Family history of malignant neoplasm, unspecified: Secondary | ICD-10-CM | POA: Diagnosis not present

## 2016-05-21 DIAGNOSIS — J449 Chronic obstructive pulmonary disease, unspecified: Secondary | ICD-10-CM | POA: Diagnosis not present

## 2016-05-21 DIAGNOSIS — J45909 Unspecified asthma, uncomplicated: Secondary | ICD-10-CM | POA: Diagnosis not present

## 2016-05-21 DIAGNOSIS — C8330 Diffuse large B-cell lymphoma, unspecified site: Secondary | ICD-10-CM | POA: Diagnosis not present

## 2016-05-21 DIAGNOSIS — C61 Malignant neoplasm of prostate: Secondary | ICD-10-CM | POA: Diagnosis not present

## 2016-05-23 ENCOUNTER — Other Ambulatory Visit: Payer: Self-pay

## 2016-05-23 DIAGNOSIS — C8338 Diffuse large B-cell lymphoma, lymph nodes of multiple sites: Secondary | ICD-10-CM

## 2016-05-23 DIAGNOSIS — C7951 Secondary malignant neoplasm of bone: Principal | ICD-10-CM

## 2016-05-23 DIAGNOSIS — C61 Malignant neoplasm of prostate: Secondary | ICD-10-CM

## 2016-05-23 MED ORDER — PREDNISONE 20 MG PO TABS: 20.0000 mg | ORAL_TABLET | Freq: Every day | ORAL | 0 refills | Status: DC

## 2016-05-27 ENCOUNTER — Inpatient Hospital Stay: Payer: Medicare Other | Attending: Oncology | Admitting: Oncology

## 2016-05-27 ENCOUNTER — Inpatient Hospital Stay: Payer: Medicare Other

## 2016-05-27 VITALS — BP 130/79 | HR 96 | Temp 96.6°F | Resp 18 | Wt 184.1 lb

## 2016-05-27 DIAGNOSIS — C8338 Diffuse large B-cell lymphoma, lymph nodes of multiple sites: Secondary | ICD-10-CM

## 2016-05-27 DIAGNOSIS — R251 Tremor, unspecified: Secondary | ICD-10-CM

## 2016-05-27 DIAGNOSIS — Z5112 Encounter for antineoplastic immunotherapy: Secondary | ICD-10-CM | POA: Diagnosis not present

## 2016-05-27 DIAGNOSIS — R5383 Other fatigue: Secondary | ICD-10-CM | POA: Diagnosis not present

## 2016-05-27 DIAGNOSIS — Z809 Family history of malignant neoplasm, unspecified: Secondary | ICD-10-CM

## 2016-05-27 DIAGNOSIS — C7951 Secondary malignant neoplasm of bone: Secondary | ICD-10-CM

## 2016-05-27 DIAGNOSIS — M129 Arthropathy, unspecified: Secondary | ICD-10-CM

## 2016-05-27 DIAGNOSIS — R51 Headache: Secondary | ICD-10-CM

## 2016-05-27 DIAGNOSIS — D649 Anemia, unspecified: Secondary | ICD-10-CM

## 2016-05-27 DIAGNOSIS — Z800 Family history of malignant neoplasm of digestive organs: Secondary | ICD-10-CM

## 2016-05-27 DIAGNOSIS — R0602 Shortness of breath: Secondary | ICD-10-CM

## 2016-05-27 DIAGNOSIS — Z79899 Other long term (current) drug therapy: Secondary | ICD-10-CM

## 2016-05-27 DIAGNOSIS — K4090 Unilateral inguinal hernia, without obstruction or gangrene, not specified as recurrent: Secondary | ICD-10-CM

## 2016-05-27 DIAGNOSIS — C61 Malignant neoplasm of prostate: Secondary | ICD-10-CM

## 2016-05-27 DIAGNOSIS — J449 Chronic obstructive pulmonary disease, unspecified: Secondary | ICD-10-CM | POA: Diagnosis not present

## 2016-05-27 DIAGNOSIS — M21372 Foot drop, left foot: Secondary | ICD-10-CM

## 2016-05-27 LAB — CBC WITH DIFFERENTIAL/PLATELET
BASOS ABS: 0 10*3/uL (ref 0–0.1)
Basophils Relative: 0 %
EOS ABS: 0.1 10*3/uL (ref 0–0.7)
Eosinophils Relative: 1 %
HCT: 39.1 % — ABNORMAL LOW (ref 40.0–52.0)
Hemoglobin: 12.9 g/dL — ABNORMAL LOW (ref 13.0–18.0)
LYMPHS ABS: 0.7 10*3/uL — AB (ref 1.0–3.6)
LYMPHS PCT: 8 %
MCH: 30.8 pg (ref 26.0–34.0)
MCHC: 33.1 g/dL (ref 32.0–36.0)
MCV: 93.2 fL (ref 80.0–100.0)
MONOS PCT: 5 %
Monocytes Absolute: 0.5 10*3/uL (ref 0.2–1.0)
NEUTROS PCT: 86 %
Neutro Abs: 7.8 10*3/uL — ABNORMAL HIGH (ref 1.4–6.5)
PLATELETS: 159 10*3/uL (ref 150–440)
RBC: 4.2 MIL/uL — AB (ref 4.40–5.90)
RDW: 15.1 % — AB (ref 11.5–14.5)
WBC: 9.1 10*3/uL (ref 3.8–10.6)

## 2016-05-27 MED ORDER — SODIUM CHLORIDE 0.9 % IV SOLN: 375.0000 mg/m2 | Freq: Once | INTRAVENOUS | Status: DC

## 2016-05-27 MED ORDER — SODIUM CHLORIDE 0.9 % IV SOLN
375.0000 mg/m2 | Freq: Once | INTRAVENOUS | Status: AC
  Administered 2016-05-27: 800 mg via INTRAVENOUS
  Filled 2016-05-27: qty 50

## 2016-05-27 MED ORDER — DIPHENHYDRAMINE HCL 25 MG PO CAPS
25.0000 mg | ORAL_CAPSULE | Freq: Once | ORAL | Status: AC
Start: 2016-05-27 — End: 2016-05-27
  Administered 2016-05-27: 25 mg via ORAL
  Filled 2016-05-27: qty 1

## 2016-05-27 MED ORDER — PREDNISONE 20 MG PO TABS: 10.0000 mg | ORAL_TABLET | Freq: Every day | ORAL | Status: DC

## 2016-05-27 MED ORDER — SODIUM CHLORIDE 0.9 % IV SOLN
Freq: Once | INTRAVENOUS | Status: AC
  Administered 2016-05-27: 12:00:00 via INTRAVENOUS
  Filled 2016-05-27: qty 1000

## 2016-05-27 MED ORDER — ACETAMINOPHEN 325 MG PO TABS
650.0000 mg | ORAL_TABLET | Freq: Once | ORAL | Status: AC
  Administered 2016-05-27: 650 mg via ORAL
  Filled 2016-05-27: qty 2

## 2016-05-27 NOTE — Progress Notes (Signed)
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  INTERVAL HISTORY: Patient returns to clinic today for further evaluation and consideration of cycle 4 of 4 of weekly Rituxan. His energy levels and appetite have improved since initiating prednisone. He reports ongoing mild fatigue and shortness of breath with exertion. His right leg/hip pain has improved. The enlarged lymph node in his left axilla is unchanged. He reports no bleeding or bruising. He reports occasional headache, self-resolving.  No numbness or tingling. He denies any recent fevers or chills. He denies weight loss. He denies any chest pain.  He has no nausea, vomiting, constipation, or diarrhea. He has no urinary complaints. Patient offers no further specific complaints today.  REVIEW OF SYSTEMS:   Review of Systems  Constitutional: Positive for malaise/fatigue. Negative for fever and weight loss.  Respiratory: Positive for shortness of breath. Negative for cough.   Cardiovascular: Negative.  Negative for chest pain and leg swelling.  Gastrointestinal: Negative.  Negative for abdominal pain, constipation, diarrhea, nausea and vomiting.  Musculoskeletal: Negative.  Negative for joint pain.  Neurological: Positive for headaches. Negative for tingling, sensory change and weakness.  Endo/Heme/Allergies: Does not bruise/bleed easily.  Psychiatric/Behavioral: Negative.  The patient is not nervous/anxious.     As per HPI. Otherwise, a complete review of systems is negative.  PAST MEDICAL HISTORY: Past Medical History:  Diagnosis Date  . Arthritis   . Asthma   . Cancer [facility])    prostate cancer-treated medically  . COPD  (chronic obstructive pulmonary disease) ([place])   . Foot drop, left   . Hernia, inguinal, left   . HOH (hard of hearing)   . Tremors of nervous system     PAST SURGICAL HISTORY: Past Surgical History:  Procedure Laterality Date  . ANKLE FRACTURE SURGERY  2009   lt  . ANKLE FRACTURE SURGERY     rt  . EYE SURGERY     both cataracts  . HEMORRHOID SURGERY    . JOINT REPLACEMENT  2000   rt hip replacement  . WRIST FRACTURE SURGERY Bilateral     FAMILY HISTORY: Family History  Problem Relation Age of Onset  . Heart attack Father   . Tremor Father   . Cancer Sister   . Stomach cancer Brother   . Heart attack Sister   . Heart attack Sister     ADVANCED DIRECTIVES (Y/N):  N  HEALTH MAINTENANCE: Social History  Substance Use Topics  . Smoking status: Never Smoker  . Smokeless tobacco: Never Used  . Alcohol use No     Colonoscopy:  PAP:  Bone density:  Lipid panel:  No Known Allergies  Current Outpatient Prescriptions  Medication Sig Dispense Refill  . acetaminophen (TYLENOL) 500 MG tablet Take 500 mg by mouth every 6 (six) hours as needed.    . Fluticasone-Salmeterol (ADVAIR DISKUS) 100-50 MCG/DOSE AEPB Inhale 1 puff into the lungs 2 (two) times daily. (Patient taking differently: Inhale 1 puff into the lungs daily as needed. ) 60 each 12  . pantoprazole (PROTONIX) 40 MG tablet Take by mouth.    . predniSONE (DELTASONE) 20 MG tablet Take 1 tablet (20 mg total) by mouth daily with breakfast. 30 tablet 0  . propranolol (  INDERAL) 20 MG tablet Take 1 tablet (20 mg total) by mouth daily. 90 tablet 3   No current facility-administered medications for this visit.     OBJECTIVE: Vitals:   05/27/16 1106  BP: 130/79  Pulse: 96  Resp: 18  Temp: (!) 96.6 F (35.9 C)     Body mass index is 26.41 kg/m.    ECOG FS:1 - Symptomatic but completely ambulatory  General: Well-developed, well-nourished, no acute distress. Eyes: Pink conjunctiva, anicteric sclera. HEENT:  Normocephalic, moist mucous membranes, clear oropharnyx. Lungs: Clear to auscultation bilaterally. Heart: Regular rate and rhythm. No rubs, murmurs, or gallops. Abdomen: Soft, nontender, nondistended. No organomegaly noted, normoactive bowel sounds. Musculoskeletal: No edema, cyanosis, or clubbing. Neuro: Alert, answering all questions appropriately. Cranial nerves grossly intact. Skin: No rashes or petechiae noted. Psych: Normal affect. Lymphatics: Easily palpable left axillary lympadenopathy.  Unchanged.  LAB RESULTS:  Lab Results  Component Value Date   NA 136 04/02/2016   K 4.5 04/02/2016   CL 104 04/02/2016   CO2 25 04/02/2016   GLUCOSE 95 04/02/2016   BUN 17 04/02/2016   CREATININE 0.81 04/02/2016   CALCIUM 9.1 04/02/2016   PROT 7.0 04/02/2016   ALBUMIN 4.0 04/02/2016   AST 23 04/02/2016   ALT 11 (L) 04/02/2016   ALKPHOS 80 04/02/2016   BILITOT 0.6 04/02/2016   GFRNONAA >60 04/02/2016   GFRAA >60 04/02/2016    Lab Results  Component Value Date   WBC 9.1 05/27/2016   NEUTROABS 7.8 (H) 05/27/2016   HGB 12.9 (L) 05/27/2016   HCT 39.1 (L) 05/27/2016   MCV 93.2 05/27/2016   PLT 159 05/27/2016     STUDIES: No results found.  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  PLAN:    1. 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 Patient's most recent PSA is trending up. 2. At least stage III diffuse large B-cell lymphoma: Axillary lymph node biopsied and pathology results reviewed independently. PET scan results reviewed independently. After lengthy discussion with the patient and his family, he does not wish to undergo chemotherapy but states he will attempt palliative treatment with Rituxan and prednisone. He expressed understanding that with the aggressiveness of his disease, single agent Rituxan may not be  effective but he wishes to pursue treatment. Proceed with cycle 4 of 4 of weekly Rituxan. Reducing prednisone from 20 mg to 10 mg prednisone daily, continuous for now.  PET scan in 4 weeks with labs (CBC, CMP, PSA), return to clinic 2 days later for scan results, lab review, and reassessment.  3. Anemia: Improved, stable. Monitor.  4. Thrombocytopenia: Resolved. 5. Bony metastasis: Nuclear med bone scan results reviewed independently. Patient has completed palliative XRT to the lesion in his right leg.   Patient expressed understanding and was in agreement with this plan. He also understands that He can call clinic at any time with any questions, concerns, or complaints.   Cancer Staging DLBCL (diffuse large B cell lymphoma) (HCC) Staging form: Hodgkin and Non-Hodgkin Lymphoma, AJCC 8th Edition - Clinical stage from 04/28/2016: Stage III (Diffuse large B-cell lymphoma) - Signed by Lloyd Huger, MD on 04/28/2016  Prostate cancer metastatic to bone [facility]) Staging form: Prostate, AJCC 7th Edition - Clinical stage from 09/24/2014: Stage IV (TX, NX, M1b, PSA: 20 or greater, Gleason X - Gleason score cannot be processed) - Signed by  Lloyd Huger, MD on 09/24/2014  Lucendia Herrlich, NP  05/27/16 4:46 PM  Patient was seen and evaluated independently and I agree with the assessment and plan as dictated above.  Lloyd Huger, MD 05/31/16 6:56 PM

## 2016-06-02 ENCOUNTER — Encounter: Payer: Self-pay | Admitting: Oncology

## 2016-06-02 NOTE — Addendum Note (Signed)
Encounter addended by: Vernie Murders, RT on: 06/02/2016 10:18 AM<BR>    Actions taken: Imaging Exam ended, Charge Capture section accepted

## 2016-06-05 ENCOUNTER — Ambulatory Visit: Payer: Self-pay | Admitting: Physician Assistant

## 2016-06-05 ENCOUNTER — Ambulatory Visit (INDEPENDENT_AMBULATORY_CARE_PROVIDER_SITE_OTHER): Payer: Medicare Other | Admitting: Physician Assistant

## 2016-06-05 ENCOUNTER — Encounter: Payer: Self-pay | Admitting: Physician Assistant

## 2016-06-05 ENCOUNTER — Telehealth: Payer: Self-pay | Admitting: *Deleted

## 2016-06-05 VITALS — BP 104/60 | HR 76 | Temp 97.8°F | Resp 16 | Wt 181.0 lb

## 2016-06-05 DIAGNOSIS — K529 Noninfective gastroenteritis and colitis, unspecified: Secondary | ICD-10-CM | POA: Diagnosis not present

## 2016-06-05 NOTE — Progress Notes (Signed)
Patient: Steven Greene Male    DOB: [birth year]   83 y.o.   MRN: [id number] Visit Date: 06/05/2016  Today's Provider: Trinna Post, PA-C   Chief Complaint  Patient presents with  . Emesis    Started last night.   . Diarrhea   Subjective:    Emesis   This is a new problem. The current episode started yesterday. The problem has been resolved. There has been no fever. Associated symptoms include diarrhea. Pertinent negatives include no abdominal pain, dizziness, fever or headaches.  Diarrhea   This is a new problem. The current episode started yesterday. The problem has been resolved. Associated symptoms include vomiting. Pertinent negatives include no abdominal pain, fever or headaches.   Patient is a 83 y/o man with prostate cancer and B cell lymphoma on Rituxan presenting with above symptoms. No problems urinating, no hematochezia or melena. Family called oncology to see if symptoms are related to treatment and oncology does not feel that they are, as reviewed in telephone record. No suspicious food. Feeling better today Not nauseas, no longer vomiting, no more diarrhea. Tolerated breakfast this morning.    No Known Allergies   Current Outpatient Prescriptions:  .  acetaminophen (TYLENOL) 500 MG tablet, Take 500 mg by mouth every 6 (six) hours as needed., Disp: , Rfl:  .  Fluticasone-Salmeterol (ADVAIR DISKUS) 100-50 MCG/DOSE AEPB, Inhale 1 puff into the lungs 2 (two) times daily. (Patient taking differently: Inhale 1 puff into the lungs daily as needed. ), Disp: 60 each, Rfl: 12 .  pantoprazole (PROTONIX) 40 MG tablet, Take by mouth., Disp: , Rfl:  .  predniSONE (DELTASONE) 20 MG tablet, Take 1 tablet (20 mg total) by mouth daily with breakfast., Disp: 30 tablet, Rfl: 0 .  propranolol (INDERAL) 20 MG tablet, Take 1 tablet (20 mg total) by mouth daily., Disp: 90 tablet, Rfl: 3  Review of Systems  Constitutional: Negative.  Negative for fever.  Gastrointestinal: Positive  for diarrhea and vomiting. Negative for abdominal distention, abdominal pain, anal bleeding, blood in stool, constipation, nausea and rectal pain.  Musculoskeletal: Negative.   Neurological: Negative for dizziness, light-headedness and headaches.    Social History  Substance Use Topics  . Smoking status: Never Smoker  . Smokeless tobacco: Never Used  . Alcohol use No   Objective:   BP 104/60 (BP Location: Left Arm, Patient Position: Sitting, Cuff Size: Normal)   Pulse 76   Temp 97.8 F (36.6 C) (Oral)   Resp 16   Wt 181 lb (82.1 kg)   BMI 25.97 kg/m  Vitals:   06/05/16 1445  BP: 104/60  Pulse: 76  Resp: 16  Temp: 97.8 F (36.6 C)  TempSrc: Oral  Weight: 181 lb (82.1 kg)   BP on recheck = 125/65  Physical Exam  Constitutional: He appears well-developed and well-nourished. No distress.  HENT:  Mouth/Throat: Mucous membranes are normal.  Cardiovascular: Normal rate and regular rhythm.   Pulmonary/Chest: Breath sounds normal.  Abdominal: Soft. Bowel sounds are normal. He exhibits no distension and no mass. There is no tenderness. There is no rebound and no guarding.  Skin: Skin is warm and dry.  Skin with slightly reduced turgor but no tenting.  Psychiatric: He has a normal mood and affect. His behavior is normal.        Assessment & Plan:     1. Gastroenteritis  Patient appears better today, vitals normal, no acute abdomen, nontoxic and afebrile. Likely infectious.  Seems a little dehydrated, have counseled on rehydration and return precautions.   Return if symptoms worsen or fail to improve.  The entirety of the information documented in the History of Present Illness, Review of Systems and Physical Exam were personally obtained by me. Portions of this information were initially documented by Ashley Royalty, CMA and reviewed by me for thoroughness and accuracy.          Trinna Post, PA-C  [facility]

## 2016-06-05 NOTE — Patient Instructions (Signed)

## 2016-06-05 NOTE — Telephone Encounter (Signed)
Agreed.  Unrelated to rituxan.

## 2016-06-05 NOTE — Telephone Encounter (Signed)
Called to report that he has been up all night with vomiting and diarrhea. Advised to call PCP or take to Urgent Care or ER. He completed Rituxan 05/27/16 and this is a new onset Explained that he may have a virus and he needs to be checked for possible dehydration or electrolyte imbalance

## 2016-06-12 ENCOUNTER — Encounter: Payer: Self-pay | Admitting: Family Medicine

## 2016-06-12 ENCOUNTER — Ambulatory Visit (INDEPENDENT_AMBULATORY_CARE_PROVIDER_SITE_OTHER): Payer: Medicare Other | Admitting: Family Medicine

## 2016-06-12 VITALS — BP 118/58 | HR 72 | Temp 98.0°F | Resp 16 | Wt 181.0 lb

## 2016-06-12 DIAGNOSIS — I10 Essential (primary) hypertension: Secondary | ICD-10-CM | POA: Diagnosis not present

## 2016-06-12 NOTE — Progress Notes (Signed)
Subjective:  HPI  Hypertension, follow-up:  BP Readings from Last 3 Encounters:  06/12/16 (!) 118/58  06/05/16 104/60  05/27/16 130/79    He was last seen for hypertension 5 months ago.  BP at that visit was 104/60. Management since that visit includes none. He reports good compliance with treatment. He is not having side effects.  He is not exercising. He is adherent to low salt diet.   Outside blood pressures are not being checked. He is experiencing none.  Patient denies chest pain, chest pressure/discomfort, claudication, dyspnea, exertional chest pressure/discomfort, fatigue, irregular heart beat, lower extremity edema, near-syncope, orthopnea, palpitations, paroxysmal nocturnal dyspnea and syncope.    Wt Readings from Last 3 Encounters:  06/12/16 181 lb (82.1 kg)  06/05/16 181 lb (82.1 kg)  05/27/16 184 lb 1.4 oz (83.5 kg)   ------------------------------------------------------------------------    Prior to Admission medications   Medication Sig Start Date End Date Taking? Authorizing Provider  acetaminophen (TYLENOL) 500 MG tablet Take 500 mg by mouth every 6 (six) hours as needed.   Yes Historical Provider, MD  pantoprazole (PROTONIX) 40 MG tablet Take by mouth.   Yes Historical Provider, MD  predniSONE (DELTASONE) 20 MG tablet Take 1 tablet (20 mg total) by mouth daily with breakfast. 05/23/16  Yes Lloyd Huger, MD  propranolol (INDERAL) 20 MG tablet Take 1 tablet (20 mg total) by mouth daily. 06/04/15  Yes Rosalynn Sergent Maceo Pro., MD  Fluticasone-Salmeterol (ADVAIR DISKUS) 100-50 MCG/DOSE AEPB Inhale 1 puff into the lungs 2 (two) times daily. Patient not taking: Reported on 06/12/2016 11/21/15   Jerrol Banana., MD    Patient Active Problem List   Diagnosis Date Noted  . DLBCL (diffuse large B cell lymphoma) ([place]) 04/20/2016  . Hypertension 12/08/2014  . Chronic obstructive airway disease ([place]) 12/08/2014  . Asthma 12/08/2014  . GERD  (gastroesophageal reflux disease) 12/08/2014  . Osteoarthritis 12/08/2014  . Chronic back pain 12/08/2014  . BPH (benign prostatic hypertrophy) 12/08/2014  . Tremor 12/08/2014  . Prostate cancer metastatic to bone [facility]) 08/31/2014    Past Medical History:  Diagnosis Date  . Arthritis   . Asthma   . Cancer [facility])    prostate cancer-treated medically  . COPD (chronic obstructive pulmonary disease) ([place])   . Foot drop, left   . Hernia, inguinal, left   . HOH (hard of hearing)   . Tremors of nervous system     Social History   Social History  . Marital status: Married    Spouse name: N/A  . Number of children: N/A  . Years of education: N/A   Occupational History  . Not on file.   Social History Main Topics  . Smoking status: Never Smoker  . Smokeless tobacco: Never Used  . Alcohol use No  . Drug use: No  . Sexual activity: Not on file   Other Topics Concern  . Not on file   Social History Narrative  . No narrative on file    No Known Allergies  Review of Systems  Constitutional: Negative.   HENT: Negative.   Eyes: Negative.   Respiratory: Negative.   Cardiovascular: Negative.   Gastrointestinal: Negative.   Genitourinary: Negative.   Skin: Negative.   Neurological: Negative.   Endo/Heme/Allergies: Negative.   Psychiatric/Behavioral: Negative.     Immunization History  Administered Date(s) Administered  . Influenza, High Dose Seasonal PF 12/11/2014, 01/17/2016  . Pneumococcal Polysaccharide-23 02/02/2012  . Tdap 02/10/2013    Objective:  BP (!) 118/58 (BP Location: Left Arm, Patient Position: Sitting, Cuff Size: Normal)   Pulse 72   Temp 98 F (36.7 C) (Oral)   Resp 16   Wt 181 lb (82.1 kg)   BMI 25.97 kg/m   Physical Exam  Constitutional: He is oriented to person, place, and time and well-developed, well-nourished, and in no distress.  HENT:  Head: Normocephalic and atraumatic.  Eyes: Conjunctivae are normal.  Neck: Neck supple.    Cardiovascular: Normal rate, regular rhythm and normal heart sounds.   Pulmonary/Chest: Effort normal and breath sounds normal.  Firm golf ball size mass in left axilla  Abdominal: Soft.  Neurological: He is alert and oriented to person, place, and time. Gait normal.  Skin: Skin is warm and dry.  Psychiatric: Mood, memory, affect and judgment normal.    Lab Results  Component Value Date   WBC 9.1 05/27/2016   HGB 12.9 (L) 05/27/2016   HCT 39.1 (L) 05/27/2016   PLT 159 05/27/2016   GLUCOSE 95 04/02/2016   PSA 57.68 (H) 04/02/2016   INR 0.8 03/22/2014    CMP     Component Value Date/Time   NA 136 04/02/2016 1358   NA 141 08/22/2014   NA 145 03/26/2014 0424   K 4.5 04/02/2016 1358   K 3.5 03/26/2014 0424   CL 104 04/02/2016 1358   CL 110 (H) 03/26/2014 0424   CO2 25 04/02/2016 1358   CO2 28 03/26/2014 0424   GLUCOSE 95 04/02/2016 1358   GLUCOSE 99 03/26/2014 0424   BUN 17 04/02/2016 1358   BUN 14 08/22/2014   BUN 9 03/26/2014 0424   CREATININE 0.81 04/02/2016 1358   CREATININE 0.95 03/26/2014 0424   CALCIUM 9.1 04/02/2016 1358   CALCIUM 7.9 (L) 03/26/2014 0424   PROT 7.0 04/02/2016 1358   PROT 6.6 03/22/2014 1540   ALBUMIN 4.0 04/02/2016 1358   ALBUMIN 3.3 (L) 03/22/2014 1540   AST 23 04/02/2016 1358   AST 32 03/22/2014 1540   ALT 11 (L) 04/02/2016 1358   ALT 21 03/22/2014 1540   ALKPHOS 80 04/02/2016 1358   ALKPHOS 80 03/22/2014 1540   BILITOT 0.6 04/02/2016 1358   BILITOT 0.3 03/22/2014 1540   GFRNONAA >60 04/02/2016 1358   GFRNONAA >60 03/26/2014 0424   GFRNONAA >60 07/09/2012 1110   GFRAA >60 04/02/2016 1358   GFRAA >60 03/26/2014 0424   GFRAA >60 07/09/2012 1110    Assessment and Plan :  1. Essential hypertension 2. Prostate cancer metastatic to bone 3.BCell Lymphoma More than 30 minutes spent discussing cancers patient has had long-term prognosis. Daughter also present. May need to get hospice involved.  I have done the exam and reviewed the  above chart and it is accurate to the best of my knowledge. Development worker, community has been used in this note in any air is in the dictation or transcription are unintentional.  [facility] 06/12/2016 3:49 PM

## 2016-06-16 ENCOUNTER — Ambulatory Visit: Payer: Self-pay | Admitting: Family Medicine

## 2016-06-18 ENCOUNTER — Ambulatory Visit: Payer: Self-pay | Admitting: Radiation Oncology

## 2016-06-23 ENCOUNTER — Ambulatory Visit
Admission: RE | Admit: 2016-06-23 | Discharge: 2016-06-23 | Disposition: A | Discharge status: Home / Self Care | Payer: Medicare Other | Source: Ambulatory Visit | Attending: Oncology | Admitting: Oncology

## 2016-06-23 DIAGNOSIS — C8338 Diffuse large B-cell lymphoma, lymph nodes of multiple sites: Secondary | ICD-10-CM | POA: Insufficient documentation

## 2016-06-23 DIAGNOSIS — R590 Localized enlarged lymph nodes: Secondary | ICD-10-CM | POA: Insufficient documentation

## 2016-06-23 DIAGNOSIS — C8510 Unspecified B-cell lymphoma, unspecified site: Secondary | ICD-10-CM | POA: Diagnosis not present

## 2016-06-23 LAB — GLUCOSE, CAPILLARY: GLUCOSE-CAPILLARY: 90 mg/dL (ref 65–99)

## 2016-06-23 MED ORDER — FLUDEOXYGLUCOSE F - 18 (FDG) INJECTION
12.0000 | Freq: Once | INTRAVENOUS | Status: AC | PRN
  Administered 2016-06-23: 12.82 via INTRAVENOUS

## 2016-06-25 NOTE — Progress Notes (Signed)
[place]  [phone] [phone]  ID: Steven Greene OB: 07-19-1919  MR#: [id number]  OQH#:[id number]  Patient Care Team: Jerrol Banana., MD as PCP - General (Unknown Physician Specialty)  CHIEF COMPLAINT: Progressive stage IV prostate cancer with bony metastasis, at least stage III diffuse large B-cell lymphoma.  INTERVAL HISTORY: Patient returns to clinic today for further evaluation and discussion of his imaging results. He currently feels well and is asymptomatic. He does not complain of weakness or fatigue. His right leg/hip pain has improved. The enlarged lymph node in his left axilla is unchanged, and intermittently causes him discomfort. He reports no bleeding or bruising. He has no neurologic complaints. He denies any recent fevers or chills. He denies weight loss. He denies any chest pain.  He has no nausea, vomiting, constipation, or diarrhea. He has no urinary complaints. Patient offers no specific complaints today.  REVIEW OF SYSTEMS:   Review of Systems  Constitutional: Negative for fever, malaise/fatigue and weight loss.  Respiratory: Negative for cough and shortness of breath.   Cardiovascular: Negative.  Negative for chest pain and leg swelling.  Gastrointestinal: Negative.  Negative for abdominal pain, constipation, diarrhea, nausea and vomiting.  Musculoskeletal: Negative.  Negative for joint pain.  Neurological: Negative.  Negative for tingling, sensory change, weakness and headaches.  Endo/Heme/Allergies: Does not bruise/bleed easily.  Psychiatric/Behavioral: Negative.  The patient is not nervous/anxious.     As per HPI. Otherwise, a complete review of systems is negative.  PAST MEDICAL HISTORY: Past Medical History:  Diagnosis Date  . Arthritis   . Asthma   . Cancer [facility])    prostate cancer-treated medically  . COPD (chronic obstructive pulmonary disease) ([place])   . Foot drop, left   . Hernia, inguinal, left   .  HOH (hard of hearing)   . Tremors of nervous system     PAST SURGICAL HISTORY: Past Surgical History:  Procedure Laterality Date  . ANKLE FRACTURE SURGERY  2009   lt  . ANKLE FRACTURE SURGERY     rt  . EYE SURGERY     both cataracts  . HEMORRHOID SURGERY    . JOINT REPLACEMENT  2000   rt hip replacement  . WRIST FRACTURE SURGERY Bilateral     FAMILY HISTORY: Family History  Problem Relation Age of Onset  . Heart attack Father   . Tremor Father   . Cancer Sister   . Stomach cancer Brother   . Heart attack Sister   . Heart attack Sister     ADVANCED DIRECTIVES (Y/N):  N  HEALTH MAINTENANCE: Social History  Substance Use Topics  . Smoking status: Never Smoker  . Smokeless tobacco: Never Used  . Alcohol use No     Colonoscopy:  PAP:  Bone density:  Lipid panel:  No Known Allergies  Current Outpatient Prescriptions  Medication Sig Dispense Refill  . acetaminophen (TYLENOL) 500 MG tablet Take 500 mg by mouth every 6 (six) hours as needed.    . Fluticasone-Salmeterol (ADVAIR DISKUS) 100-50 MCG/DOSE AEPB Inhale 1 puff into the lungs 2 (two) times daily. 60 each 12  . pantoprazole (PROTONIX) 40 MG tablet Take by mouth.    . predniSONE (DELTASONE) 20 MG tablet Take 1 tablet (20 mg total) by mouth daily with breakfast. 30 tablet 0  . propranolol (INDERAL) 20 MG tablet Take 1 tablet (20 mg total) by mouth daily. 90 tablet 3  . predniSONE (DELTASONE) 10 MG tablet Take 1 tablet (10  mg total) by mouth daily with breakfast. 30 tablet 0   No current facility-administered medications for this visit.     OBJECTIVE: Vitals:   06/26/16 1405  BP: (!) 163/84  Pulse: 60  Resp: 18  Temp: 97.3 F (36.3 C)     Body mass index is 26.17 kg/m.    ECOG FS:1 - Symptomatic but completely ambulatory  General: Well-developed, well-nourished, no acute distress. Eyes: Pink conjunctiva, anicteric sclera. HEENT: Normocephalic, moist mucous membranes, clear oropharnyx. Lungs: Clear  to auscultation bilaterally. Heart: Regular rate and rhythm. No rubs, murmurs, or gallops. Abdomen: Soft, nontender, nondistended. No organomegaly noted, normoactive bowel sounds. Musculoskeletal: No edema, cyanosis, or clubbing. Neuro: Alert, answering all questions appropriately. Cranial nerves grossly intact. Skin: No rashes or petechiae noted. Psych: Normal affect. Lymphatics: Easily palpable left axillary lympadenopathy.  Unchanged.  LAB RESULTS:  Lab Results  Component Value Date   NA 135 06/26/2016   K 4.7 06/26/2016   CL 102 06/26/2016   CO2 25 06/26/2016   GLUCOSE 124 (H) 06/26/2016   BUN 23 (H) 06/26/2016   CREATININE 1.00 06/26/2016   CALCIUM 9.2 06/26/2016   PROT 6.8 06/26/2016   ALBUMIN 3.8 06/26/2016   AST 25 06/26/2016   ALT 16 (L) 06/26/2016   ALKPHOS 39 06/26/2016   BILITOT 0.7 06/26/2016   GFRNONAA >60 06/26/2016   GFRAA >60 06/26/2016    Lab Results  Component Value Date   WBC 8.0 06/26/2016   NEUTROABS 6.9 (H) 06/26/2016   HGB 13.2 06/26/2016   HCT 39.4 (L) 06/26/2016   MCV 91.9 06/26/2016   PLT 177 06/26/2016     STUDIES: Nm Pet Image Restag (ps) Skull Base To Thigh  Result Date: 06/23/2016 CLINICAL DATA:  Subsequent treatment strategy for diffuse large B-cell lymphoma. EXAM: NUCLEAR MEDICINE PET SKULL BASE TO THIGH TECHNIQUE: 12.82 mCi F-18 FDG was injected intravenously. Full-ring PET imaging was performed from the skull base to thigh after the radiotracer. CT data was obtained and used for attenuation correction and anatomic localization. FASTING BLOOD GLUCOSE:  Value: 90 mg/dl COMPARISON:  PET-CT dated 04/24/2016 FINDINGS: NECK No hypermetabolic cervical lymphadenopathy. Prior hypermetabolic nodes on the right have resolved. CHEST Index left axillary node now measures 7.0 x 5.9 cm with max SUV 10.8, previously decreased in size measuring 4.4 x 4.7 cm, but with max SUV 15.3. Additional thoracic lymphadenopathy has improved, including: --1.5 cm short  axis right axillary (series 3/ image 87) with max SUV 39.2, previously 2.3 cm with max SUV 60.0 --6 mm short axis low right paratracheal node (series 3/ image 94) with max SUV 1.5, previously 8 mm with max SUV 4.2 --10 mm short axis right retrocrural node (series 3/image 133) with max SUV 4.0, previously 1.8 cm with max SUV 9.9 No suspicious pulmonary nodules. Linear scarring/ atelectasis in the bilateral lower lobes. Cardiomegaly. Coronary atherosclerosis in the LAD. Atherosclerotic calcifications of the aortic root/valve. ABDOMEN/PELVIS No abnormal hypermetabolic activity within the liver, pancreas, adrenal glands, or spleen. Improving retroperitoneal and pelvic lymphadenopathy, including: --10 mm short axis right para-aortic node (series 3/ image 160) with max SUV 3.9, previously 15 mm with max SUV 10.1 --12 mm short axis aortocaval node (series 3/ image 162) with max SUV 4.1, previously 1.9 cm with max SUV 15.2 --2.1 cm short axis left common iliac node (series 3/ image 200) with max SUV 11.3, previously 2.1 cm with max SUV 18.2 --9 mm short axis left obturator node (series 3/ image 225) with max SUV 4.0, previously  17 mm with max SUV 10.0 SKELETON No focal hypermetabolic activity to suggest skeletal metastasis. Right hip arthroplasty. IMPRESSION: Dominant left axillary node is increased in size but decreased in hypermetabolism. Markedly hypermetabolic right axillary node is improved. Additional thoracic, abdominal, and pelvic lymphadenopathy is improved, as above. Electronically Signed   By: Julian Hy M.D.   On: 06/23/2016 11:55    ASSESSMENT: Progressive stage IV prostate cancer with bony metastasis, at least stage III diffuse large B-cell lymphoma.  PLAN:    1. 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 Patient's most recent PSA is trending up. He does not wish any further treatment at this time. 2. At least stage  III diffuse large B-cell lymphoma: PET scan results reviewed independently and reported as above with increase of size of left axillary node, but decreased in hypermetabolism. The additional lymphadenopathy has overall improved. Given these results with likely persistent disease in his left axilla as well as continued discomfort, have referred him to radiation oncology for consideration of XRT. Patient will then have repeat PET scan in approximately 8 weeks with follow-up 1-2 days later. Patient does not wish to reinitiate Rituxan at this time. Have also instructed him to decrease his prednisone dose to 10 mg. 3. Anemia: Patient's hemoglobin is now within normal limits.  4. Thrombocytopenia: Resolved. 5. Bony metastasis: Nuclear med bone scan results reviewed independently. Patient has completed palliative XRT to the lesion in his right leg.   Patient expressed understanding and was in agreement with this plan. He also understands that He can call clinic at any time with any questions, concerns, or complaints.   Cancer Staging DLBCL (diffuse large B cell lymphoma) (HCC) Staging form: Hodgkin and Non-Hodgkin Lymphoma, AJCC 8th Edition - Clinical stage from 04/28/2016: Stage III (Diffuse large B-cell lymphoma) - Signed by Lloyd Huger, MD on 04/28/2016  Prostate cancer metastatic to bone [facility]) Staging form: Prostate, AJCC 7th Edition - Clinical stage from 09/24/2014: Stage IV (TX, NX, M1b, PSA: 20 or greater, Gleason X - Gleason score cannot be processed) - Signed by Lloyd Huger, MD on 09/24/2014  Lloyd Huger, MD 07/03/16 1:15 AM

## 2016-06-26 ENCOUNTER — Ambulatory Visit
Admission: RE | Admit: 2016-06-26 | Discharge: 2016-06-26 | Disposition: A | Discharge status: Home / Self Care | Payer: Medicare Other | Source: Ambulatory Visit | Attending: Radiation Oncology | Admitting: Radiation Oncology

## 2016-06-26 ENCOUNTER — Inpatient Hospital Stay (HOSPITAL_BASED_OUTPATIENT_CLINIC_OR_DEPARTMENT_OTHER): Payer: Medicare Other | Admitting: Oncology

## 2016-06-26 ENCOUNTER — Inpatient Hospital Stay: Payer: Medicare Other | Attending: Oncology

## 2016-06-26 VITALS — BP 144/64 | HR 80 | Temp 97.0°F | Wt 182.7 lb

## 2016-06-26 VITALS — BP 163/84 | HR 60 | Temp 97.3°F | Resp 18 | Wt 182.4 lb

## 2016-06-26 DIAGNOSIS — J45909 Unspecified asthma, uncomplicated: Secondary | ICD-10-CM | POA: Diagnosis not present

## 2016-06-26 DIAGNOSIS — Z7952 Long term (current) use of systemic steroids: Secondary | ICD-10-CM | POA: Insufficient documentation

## 2016-06-26 DIAGNOSIS — M129 Arthropathy, unspecified: Secondary | ICD-10-CM

## 2016-06-26 DIAGNOSIS — Z79899 Other long term (current) drug therapy: Secondary | ICD-10-CM | POA: Insufficient documentation

## 2016-06-26 DIAGNOSIS — Z800 Family history of malignant neoplasm of digestive organs: Secondary | ICD-10-CM | POA: Insufficient documentation

## 2016-06-26 DIAGNOSIS — C7951 Secondary malignant neoplasm of bone: Secondary | ICD-10-CM

## 2016-06-26 DIAGNOSIS — Z8781 Personal history of (healed) traumatic fracture: Secondary | ICD-10-CM | POA: Diagnosis not present

## 2016-06-26 DIAGNOSIS — R590 Localized enlarged lymph nodes: Secondary | ICD-10-CM

## 2016-06-26 DIAGNOSIS — K4090 Unilateral inguinal hernia, without obstruction or gangrene, not specified as recurrent: Secondary | ICD-10-CM | POA: Insufficient documentation

## 2016-06-26 DIAGNOSIS — I517 Cardiomegaly: Secondary | ICD-10-CM | POA: Diagnosis not present

## 2016-06-26 DIAGNOSIS — C61 Malignant neoplasm of prostate: Secondary | ICD-10-CM | POA: Diagnosis not present

## 2016-06-26 DIAGNOSIS — Z809 Family history of malignant neoplasm, unspecified: Secondary | ICD-10-CM | POA: Diagnosis not present

## 2016-06-26 DIAGNOSIS — J449 Chronic obstructive pulmonary disease, unspecified: Secondary | ICD-10-CM | POA: Diagnosis not present

## 2016-06-26 DIAGNOSIS — C7989 Secondary malignant neoplasm of other specified sites: Secondary | ICD-10-CM

## 2016-06-26 DIAGNOSIS — C8338 Diffuse large B-cell lymphoma, lymph nodes of multiple sites: Secondary | ICD-10-CM

## 2016-06-26 DIAGNOSIS — C8330 Diffuse large B-cell lymphoma, unspecified site: Secondary | ICD-10-CM

## 2016-06-26 DIAGNOSIS — M21372 Foot drop, left foot: Secondary | ICD-10-CM | POA: Diagnosis not present

## 2016-06-26 DIAGNOSIS — I2510 Atherosclerotic heart disease of native coronary artery without angina pectoris: Secondary | ICD-10-CM | POA: Diagnosis not present

## 2016-06-26 DIAGNOSIS — C8514 Unspecified B-cell lymphoma, lymph nodes of axilla and upper limb: Secondary | ICD-10-CM | POA: Insufficient documentation

## 2016-06-26 DIAGNOSIS — Z510 Encounter for antineoplastic radiation therapy: Secondary | ICD-10-CM | POA: Insufficient documentation

## 2016-06-26 LAB — COMPREHENSIVE METABOLIC PANEL
ALT: 16 U/L — AB (ref 17–63)
AST: 25 U/L (ref 15–41)
Albumin: 3.8 g/dL (ref 3.5–5.0)
Alkaline Phosphatase: 39 U/L (ref 38–126)
Anion gap: 8 (ref 5–15)
BUN: 23 mg/dL — ABNORMAL HIGH (ref 6–20)
CHLORIDE: 102 mmol/L (ref 101–111)
CO2: 25 mmol/L (ref 22–32)
CREATININE: 1 mg/dL (ref 0.61–1.24)
Calcium: 9.2 mg/dL (ref 8.9–10.3)
Glucose, Bld: 124 mg/dL — ABNORMAL HIGH (ref 65–99)
POTASSIUM: 4.7 mmol/L (ref 3.5–5.1)
Sodium: 135 mmol/L (ref 135–145)
Total Bilirubin: 0.7 mg/dL (ref 0.3–1.2)
Total Protein: 6.8 g/dL (ref 6.5–8.1)

## 2016-06-26 LAB — CBC WITH DIFFERENTIAL/PLATELET
Basophils Absolute: 0 10*3/uL (ref 0–0.1)
Basophils Relative: 0 %
EOS PCT: 0 %
Eosinophils Absolute: 0 10*3/uL (ref 0–0.7)
HCT: 39.4 % — ABNORMAL LOW (ref 40.0–52.0)
Hemoglobin: 13.2 g/dL (ref 13.0–18.0)
LYMPHS ABS: 0.9 10*3/uL — AB (ref 1.0–3.6)
LYMPHS PCT: 11 %
MCH: 30.8 pg (ref 26.0–34.0)
MCHC: 33.6 g/dL (ref 32.0–36.0)
MCV: 91.9 fL (ref 80.0–100.0)
MONO ABS: 0.2 10*3/uL (ref 0.2–1.0)
MONOS PCT: 3 %
Neutro Abs: 6.9 10*3/uL — ABNORMAL HIGH (ref 1.4–6.5)
Neutrophils Relative %: 86 %
PLATELETS: 177 10*3/uL (ref 150–440)
RBC: 4.29 MIL/uL — ABNORMAL LOW (ref 4.40–5.90)
RDW: 15.5 % — AB (ref 11.5–14.5)
WBC: 8 10*3/uL (ref 3.8–10.6)

## 2016-06-26 LAB — PSA: PSA: 32.88 ng/mL — AB (ref 0.00–4.00)

## 2016-06-26 MED ORDER — PREDNISONE 10 MG PO TABS: 10.0000 mg | ORAL_TABLET | Freq: Every day | ORAL | 0 refills | Status: DC

## 2016-06-26 NOTE — Progress Notes (Signed)
Radiation Oncology Old patient new area Note  Name: Steven Greene   Date:   06/26/2016 MRN:  [id number] DOB: 27-Dec-1919    This 83 y.o. male presents to the clinic today for one-month follow-up for palliative radiation therapy to his right hip for stage IV prostate cancer as well as reevaluation for large growing left axillary lymph node secondary to diffuse B-cell lymphoma.  REFERRING PROVIDER: Jerrol Banana.,*  HPI: patient is a 83 year old male well-known to department now 1 month out having received palliative radiation therapy to his right hip for stage IV prostate cancer. He also has known diffuse B-cell lymphoma recently had a PET scan shows large hypermetabolic node in his left axilla. Hehe has achieved excellent palliation his hip. He is ambulating with the assistance of a walker although having no pain or discomfort.recent PET scan shows increase in size of the left axillary node measuring approximately 7 x 6 cm. This is really causing no significant pain but is starting to affect his arm mobility. I been asked by medical oncology to evaluate for possible palliative radiation therapy  COMPLICATIONS OF TREATMENT: none  FOLLOW UP COMPLIANCE: keeps appointments   PHYSICAL EXAM:  BP (!) 144/64 (BP Location: Left Arm, Patient Position: Sitting, Cuff Size: Normal)   Pulse 80 Comment: manually  Temp 97 F (36.1 C) (Tympanic)   Wt 182 lb 10.4 oz (82.9 kg)   SpO2 97%   BMI 26.21 kg/m  Prominent 7 cm left axillary lymph node as described above. Also has proximal me 2 cm lymph node in his right axilla. Range of motion of his lower extremities does not elicit pain motor sensory and DTR levels are equal and symmetric in the upper and lower extremities. Well-developed well-nourished patient in NAD. HEENT reveals PERLA, EOMI, discs not visualized.  Oral cavity is clear. No oral mucosal lesions are identified. Neck is clear without evidence of cervical or supraclavicular adenopathy. Lungs  are clear to A&P. Cardiac examination is essentially unremarkable with regular rate and rhythm without murmur rub or thrill. Abdomen is benign with no organomegaly or masses noted. Motor sensory and DTR levels are equal and symmetric in the upper and lower extremities. Cranial nerves II through XII are grossly intact. Proprioception is intact. No peripheral adenopathy or edema is identified. No motor or sensory levels are noted. Crude visual fields are within normal range.  RADIOLOGY RESULTS: PET scan is reviewed and compatible with the above-stated findings  PLAN: this time I to go ahead with palliative radiation therapy to his left axilla. Would plan on delivering 3000 cGy in 15 fractions. Risks and benefits of treatment including skin reaction fatigue alteration of blood counts all were discussed in detail with the patient and his daughter. I personally ordered and scheduled CT simulation for early next week.  I would like to take this opportunity to thank you for allowing me to participate in the care of your patient.Armstead Peaks., MD

## 2016-06-26 NOTE — Progress Notes (Signed)
Patient had an intestinal virus about 2 weeks ago.  Patient accompanied by daughter today who states it was discussed at last visit about reducing the prednisone dosage.  When they picked up the rx it was the same amount (20 mg).  Patient is unable to cut pills.  Can a lower dosage be prescribed for him?

## 2016-06-26 NOTE — Progress Notes (Signed)
Patient here  Follow up no changes since last appointment. Patient would like to be able to stop taking prednisone.

## 2016-06-30 ENCOUNTER — Ambulatory Visit
Admission: RE | Admit: 2016-06-30 | Discharge: 2016-06-30 | Disposition: A | Discharge status: Home / Self Care | Payer: Medicare Other | Source: Ambulatory Visit | Attending: Radiation Oncology | Admitting: Radiation Oncology

## 2016-06-30 DIAGNOSIS — Z510 Encounter for antineoplastic radiation therapy: Secondary | ICD-10-CM | POA: Diagnosis not present

## 2016-06-30 DIAGNOSIS — C8338 Diffuse large B-cell lymphoma, lymph nodes of multiple sites: Secondary | ICD-10-CM | POA: Diagnosis not present

## 2016-06-30 DIAGNOSIS — C7951 Secondary malignant neoplasm of bone: Secondary | ICD-10-CM | POA: Diagnosis not present

## 2016-06-30 DIAGNOSIS — C61 Malignant neoplasm of prostate: Secondary | ICD-10-CM | POA: Diagnosis not present

## 2016-06-30 DIAGNOSIS — C8514 Unspecified B-cell lymphoma, lymph nodes of axilla and upper limb: Secondary | ICD-10-CM | POA: Diagnosis not present

## 2016-07-04 ENCOUNTER — Other Ambulatory Visit: Payer: Self-pay | Admitting: *Deleted

## 2016-07-04 DIAGNOSIS — C8338 Diffuse large B-cell lymphoma, lymph nodes of multiple sites: Secondary | ICD-10-CM

## 2016-07-04 DIAGNOSIS — C8514 Unspecified B-cell lymphoma, lymph nodes of axilla and upper limb: Secondary | ICD-10-CM | POA: Diagnosis not present

## 2016-07-04 DIAGNOSIS — C61 Malignant neoplasm of prostate: Secondary | ICD-10-CM | POA: Diagnosis not present

## 2016-07-04 DIAGNOSIS — Z510 Encounter for antineoplastic radiation therapy: Secondary | ICD-10-CM | POA: Diagnosis not present

## 2016-07-04 DIAGNOSIS — C7951 Secondary malignant neoplasm of bone: Secondary | ICD-10-CM | POA: Diagnosis not present

## 2016-07-07 ENCOUNTER — Ambulatory Visit
Admission: RE | Admit: 2016-07-07 | Discharge: 2016-07-07 | Disposition: A | Discharge status: Home / Self Care | Payer: Medicare Other | Source: Ambulatory Visit | Attending: Radiation Oncology | Admitting: Radiation Oncology

## 2016-07-07 DIAGNOSIS — C7951 Secondary malignant neoplasm of bone: Secondary | ICD-10-CM | POA: Diagnosis not present

## 2016-07-07 DIAGNOSIS — C8514 Unspecified B-cell lymphoma, lymph nodes of axilla and upper limb: Secondary | ICD-10-CM | POA: Diagnosis not present

## 2016-07-07 DIAGNOSIS — C8338 Diffuse large B-cell lymphoma, lymph nodes of multiple sites: Secondary | ICD-10-CM | POA: Diagnosis not present

## 2016-07-07 DIAGNOSIS — Z510 Encounter for antineoplastic radiation therapy: Secondary | ICD-10-CM | POA: Diagnosis not present

## 2016-07-07 DIAGNOSIS — C61 Malignant neoplasm of prostate: Secondary | ICD-10-CM | POA: Diagnosis not present

## 2016-07-08 ENCOUNTER — Ambulatory Visit
Admission: RE | Admit: 2016-07-08 | Discharge: 2016-07-08 | Disposition: A | Discharge status: Home / Self Care | Payer: Medicare Other | Source: Ambulatory Visit | Attending: Radiation Oncology | Admitting: Radiation Oncology

## 2016-07-08 DIAGNOSIS — C8514 Unspecified B-cell lymphoma, lymph nodes of axilla and upper limb: Secondary | ICD-10-CM | POA: Diagnosis not present

## 2016-07-08 DIAGNOSIS — Z510 Encounter for antineoplastic radiation therapy: Secondary | ICD-10-CM | POA: Diagnosis not present

## 2016-07-08 DIAGNOSIS — C7951 Secondary malignant neoplasm of bone: Secondary | ICD-10-CM | POA: Diagnosis not present

## 2016-07-08 DIAGNOSIS — C61 Malignant neoplasm of prostate: Secondary | ICD-10-CM | POA: Diagnosis not present

## 2016-07-09 ENCOUNTER — Ambulatory Visit
Admission: RE | Admit: 2016-07-09 | Discharge: 2016-07-09 | Disposition: A | Discharge status: Home / Self Care | Payer: Medicare Other | Source: Ambulatory Visit | Attending: Radiation Oncology | Admitting: Radiation Oncology

## 2016-07-09 DIAGNOSIS — Z510 Encounter for antineoplastic radiation therapy: Secondary | ICD-10-CM | POA: Diagnosis not present

## 2016-07-09 DIAGNOSIS — C7951 Secondary malignant neoplasm of bone: Secondary | ICD-10-CM | POA: Diagnosis not present

## 2016-07-09 DIAGNOSIS — C8514 Unspecified B-cell lymphoma, lymph nodes of axilla and upper limb: Secondary | ICD-10-CM | POA: Diagnosis not present

## 2016-07-09 DIAGNOSIS — C61 Malignant neoplasm of prostate: Secondary | ICD-10-CM | POA: Diagnosis not present

## 2016-07-10 ENCOUNTER — Ambulatory Visit
Admission: RE | Admit: 2016-07-10 | Discharge: 2016-07-10 | Disposition: A | Discharge status: Home / Self Care | Payer: Medicare Other | Source: Ambulatory Visit | Attending: Radiation Oncology | Admitting: Radiation Oncology

## 2016-07-10 DIAGNOSIS — Z510 Encounter for antineoplastic radiation therapy: Secondary | ICD-10-CM | POA: Diagnosis not present

## 2016-07-10 DIAGNOSIS — C7951 Secondary malignant neoplasm of bone: Secondary | ICD-10-CM | POA: Diagnosis not present

## 2016-07-10 DIAGNOSIS — C61 Malignant neoplasm of prostate: Secondary | ICD-10-CM | POA: Diagnosis not present

## 2016-07-10 DIAGNOSIS — C8514 Unspecified B-cell lymphoma, lymph nodes of axilla and upper limb: Secondary | ICD-10-CM | POA: Diagnosis not present

## 2016-07-11 ENCOUNTER — Ambulatory Visit
Admission: RE | Admit: 2016-07-11 | Discharge: 2016-07-11 | Disposition: A | Discharge status: Home / Self Care | Payer: Medicare Other | Source: Ambulatory Visit | Attending: Radiation Oncology | Admitting: Radiation Oncology

## 2016-07-11 DIAGNOSIS — C7951 Secondary malignant neoplasm of bone: Secondary | ICD-10-CM | POA: Diagnosis not present

## 2016-07-11 DIAGNOSIS — C61 Malignant neoplasm of prostate: Secondary | ICD-10-CM | POA: Diagnosis not present

## 2016-07-11 DIAGNOSIS — Z510 Encounter for antineoplastic radiation therapy: Secondary | ICD-10-CM | POA: Diagnosis not present

## 2016-07-11 DIAGNOSIS — C8514 Unspecified B-cell lymphoma, lymph nodes of axilla and upper limb: Secondary | ICD-10-CM | POA: Diagnosis not present

## 2016-07-14 ENCOUNTER — Ambulatory Visit
Admission: RE | Admit: 2016-07-14 | Discharge: 2016-07-14 | Disposition: A | Discharge status: Home / Self Care | Payer: Medicare Other | Source: Ambulatory Visit | Attending: Radiation Oncology | Admitting: Radiation Oncology

## 2016-07-14 DIAGNOSIS — C61 Malignant neoplasm of prostate: Secondary | ICD-10-CM | POA: Diagnosis not present

## 2016-07-14 DIAGNOSIS — Z510 Encounter for antineoplastic radiation therapy: Secondary | ICD-10-CM | POA: Diagnosis not present

## 2016-07-14 DIAGNOSIS — C8338 Diffuse large B-cell lymphoma, lymph nodes of multiple sites: Secondary | ICD-10-CM | POA: Diagnosis not present

## 2016-07-14 DIAGNOSIS — C8514 Unspecified B-cell lymphoma, lymph nodes of axilla and upper limb: Secondary | ICD-10-CM | POA: Diagnosis not present

## 2016-07-14 DIAGNOSIS — C7951 Secondary malignant neoplasm of bone: Secondary | ICD-10-CM | POA: Diagnosis not present

## 2016-07-15 ENCOUNTER — Ambulatory Visit
Admission: RE | Admit: 2016-07-15 | Discharge: 2016-07-15 | Disposition: A | Discharge status: Home / Self Care | Payer: Medicare Other | Source: Ambulatory Visit | Attending: Radiation Oncology | Admitting: Radiation Oncology

## 2016-07-15 ENCOUNTER — Inpatient Hospital Stay: Payer: Medicare Other

## 2016-07-15 DIAGNOSIS — Z7952 Long term (current) use of systemic steroids: Secondary | ICD-10-CM | POA: Diagnosis not present

## 2016-07-15 DIAGNOSIS — C8514 Unspecified B-cell lymphoma, lymph nodes of axilla and upper limb: Secondary | ICD-10-CM | POA: Diagnosis not present

## 2016-07-15 DIAGNOSIS — I517 Cardiomegaly: Secondary | ICD-10-CM | POA: Diagnosis not present

## 2016-07-15 DIAGNOSIS — M129 Arthropathy, unspecified: Secondary | ICD-10-CM | POA: Diagnosis not present

## 2016-07-15 DIAGNOSIS — C61 Malignant neoplasm of prostate: Secondary | ICD-10-CM | POA: Diagnosis not present

## 2016-07-15 DIAGNOSIS — K4090 Unilateral inguinal hernia, without obstruction or gangrene, not specified as recurrent: Secondary | ICD-10-CM | POA: Diagnosis not present

## 2016-07-15 DIAGNOSIS — Z510 Encounter for antineoplastic radiation therapy: Secondary | ICD-10-CM | POA: Diagnosis not present

## 2016-07-15 DIAGNOSIS — C8338 Diffuse large B-cell lymphoma, lymph nodes of multiple sites: Secondary | ICD-10-CM

## 2016-07-15 DIAGNOSIS — J449 Chronic obstructive pulmonary disease, unspecified: Secondary | ICD-10-CM | POA: Diagnosis not present

## 2016-07-15 DIAGNOSIS — C8330 Diffuse large B-cell lymphoma, unspecified site: Secondary | ICD-10-CM | POA: Diagnosis not present

## 2016-07-15 DIAGNOSIS — Z8781 Personal history of (healed) traumatic fracture: Secondary | ICD-10-CM | POA: Diagnosis not present

## 2016-07-15 DIAGNOSIS — Z79899 Other long term (current) drug therapy: Secondary | ICD-10-CM | POA: Diagnosis not present

## 2016-07-15 DIAGNOSIS — I2510 Atherosclerotic heart disease of native coronary artery without angina pectoris: Secondary | ICD-10-CM | POA: Diagnosis not present

## 2016-07-15 DIAGNOSIS — J45909 Unspecified asthma, uncomplicated: Secondary | ICD-10-CM | POA: Diagnosis not present

## 2016-07-15 DIAGNOSIS — R590 Localized enlarged lymph nodes: Secondary | ICD-10-CM | POA: Diagnosis not present

## 2016-07-15 DIAGNOSIS — M21372 Foot drop, left foot: Secondary | ICD-10-CM | POA: Diagnosis not present

## 2016-07-15 DIAGNOSIS — C7951 Secondary malignant neoplasm of bone: Secondary | ICD-10-CM | POA: Diagnosis not present

## 2016-07-15 DIAGNOSIS — Z809 Family history of malignant neoplasm, unspecified: Secondary | ICD-10-CM | POA: Diagnosis not present

## 2016-07-15 DIAGNOSIS — Z800 Family history of malignant neoplasm of digestive organs: Secondary | ICD-10-CM | POA: Diagnosis not present

## 2016-07-15 LAB — CBC
HEMATOCRIT: 37.8 % — AB (ref 40.0–52.0)
HEMOGLOBIN: 12.6 g/dL — AB (ref 13.0–18.0)
MCH: 30.9 pg (ref 26.0–34.0)
MCHC: 33.3 g/dL (ref 32.0–36.0)
MCV: 92.9 fL (ref 80.0–100.0)
Platelets: 180 10*3/uL (ref 150–440)
RBC: 4.07 MIL/uL — ABNORMAL LOW (ref 4.40–5.90)
RDW: 16.5 % — AB (ref 11.5–14.5)
WBC: 7.7 10*3/uL (ref 3.8–10.6)

## 2016-07-16 ENCOUNTER — Ambulatory Visit
Admission: RE | Admit: 2016-07-16 | Discharge: 2016-07-16 | Disposition: A | Discharge status: Home / Self Care | Payer: Medicare Other | Source: Ambulatory Visit | Attending: Radiation Oncology | Admitting: Radiation Oncology

## 2016-07-16 DIAGNOSIS — Z510 Encounter for antineoplastic radiation therapy: Secondary | ICD-10-CM | POA: Diagnosis not present

## 2016-07-16 DIAGNOSIS — C8514 Unspecified B-cell lymphoma, lymph nodes of axilla and upper limb: Secondary | ICD-10-CM | POA: Diagnosis not present

## 2016-07-16 DIAGNOSIS — C7951 Secondary malignant neoplasm of bone: Secondary | ICD-10-CM | POA: Diagnosis not present

## 2016-07-16 DIAGNOSIS — C61 Malignant neoplasm of prostate: Secondary | ICD-10-CM | POA: Diagnosis not present

## 2016-07-17 ENCOUNTER — Ambulatory Visit
Admission: RE | Admit: 2016-07-17 | Discharge: 2016-07-17 | Disposition: A | Discharge status: Home / Self Care | Payer: Medicare Other | Source: Ambulatory Visit | Attending: Radiation Oncology | Admitting: Radiation Oncology

## 2016-07-17 DIAGNOSIS — Z510 Encounter for antineoplastic radiation therapy: Secondary | ICD-10-CM | POA: Diagnosis not present

## 2016-07-17 DIAGNOSIS — C8514 Unspecified B-cell lymphoma, lymph nodes of axilla and upper limb: Secondary | ICD-10-CM | POA: Diagnosis not present

## 2016-07-17 DIAGNOSIS — C7951 Secondary malignant neoplasm of bone: Secondary | ICD-10-CM | POA: Diagnosis not present

## 2016-07-17 DIAGNOSIS — C61 Malignant neoplasm of prostate: Secondary | ICD-10-CM | POA: Diagnosis not present

## 2016-07-18 ENCOUNTER — Ambulatory Visit
Admission: RE | Admit: 2016-07-18 | Discharge: 2016-07-18 | Disposition: A | Discharge status: Home / Self Care | Payer: Medicare Other | Source: Ambulatory Visit | Attending: Radiation Oncology | Admitting: Radiation Oncology

## 2016-07-18 DIAGNOSIS — C7951 Secondary malignant neoplasm of bone: Secondary | ICD-10-CM | POA: Diagnosis not present

## 2016-07-18 DIAGNOSIS — C61 Malignant neoplasm of prostate: Secondary | ICD-10-CM | POA: Diagnosis not present

## 2016-07-18 DIAGNOSIS — Z510 Encounter for antineoplastic radiation therapy: Secondary | ICD-10-CM | POA: Diagnosis not present

## 2016-07-18 DIAGNOSIS — C8514 Unspecified B-cell lymphoma, lymph nodes of axilla and upper limb: Secondary | ICD-10-CM | POA: Diagnosis not present

## 2016-07-21 ENCOUNTER — Ambulatory Visit
Admission: RE | Admit: 2016-07-21 | Discharge: 2016-07-21 | Disposition: A | Discharge status: Home / Self Care | Payer: Medicare Other | Source: Ambulatory Visit | Attending: Radiation Oncology | Admitting: Radiation Oncology

## 2016-07-21 DIAGNOSIS — C7951 Secondary malignant neoplasm of bone: Secondary | ICD-10-CM | POA: Diagnosis not present

## 2016-07-21 DIAGNOSIS — Z510 Encounter for antineoplastic radiation therapy: Secondary | ICD-10-CM | POA: Diagnosis not present

## 2016-07-21 DIAGNOSIS — C8338 Diffuse large B-cell lymphoma, lymph nodes of multiple sites: Secondary | ICD-10-CM | POA: Diagnosis not present

## 2016-07-21 DIAGNOSIS — C61 Malignant neoplasm of prostate: Secondary | ICD-10-CM | POA: Diagnosis not present

## 2016-07-21 DIAGNOSIS — C8514 Unspecified B-cell lymphoma, lymph nodes of axilla and upper limb: Secondary | ICD-10-CM | POA: Diagnosis not present

## 2016-07-22 ENCOUNTER — Ambulatory Visit
Admission: RE | Admit: 2016-07-22 | Discharge: 2016-07-22 | Disposition: A | Discharge status: Home / Self Care | Payer: Medicare Other | Source: Ambulatory Visit | Attending: Radiation Oncology | Admitting: Radiation Oncology

## 2016-07-22 ENCOUNTER — Inpatient Hospital Stay: Payer: Medicare Other | Attending: Oncology

## 2016-07-22 DIAGNOSIS — C61 Malignant neoplasm of prostate: Secondary | ICD-10-CM | POA: Diagnosis not present

## 2016-07-22 DIAGNOSIS — C8338 Diffuse large B-cell lymphoma, lymph nodes of multiple sites: Secondary | ICD-10-CM

## 2016-07-22 DIAGNOSIS — C7951 Secondary malignant neoplasm of bone: Secondary | ICD-10-CM | POA: Diagnosis not present

## 2016-07-22 DIAGNOSIS — Z510 Encounter for antineoplastic radiation therapy: Secondary | ICD-10-CM | POA: Diagnosis not present

## 2016-07-22 DIAGNOSIS — C8514 Unspecified B-cell lymphoma, lymph nodes of axilla and upper limb: Secondary | ICD-10-CM | POA: Diagnosis not present

## 2016-07-22 LAB — CBC
HCT: 36.8 % — ABNORMAL LOW (ref 40.0–52.0)
HEMOGLOBIN: 12.4 g/dL — AB (ref 13.0–18.0)
MCH: 31 pg (ref 26.0–34.0)
MCHC: 33.7 g/dL (ref 32.0–36.0)
MCV: 92 fL (ref 80.0–100.0)
Platelets: 187 10*3/uL (ref 150–440)
RBC: 4 MIL/uL — ABNORMAL LOW (ref 4.40–5.90)
RDW: 15.9 % — AB (ref 11.5–14.5)
WBC: 7.1 10*3/uL (ref 3.8–10.6)

## 2016-07-23 ENCOUNTER — Ambulatory Visit
Admission: RE | Admit: 2016-07-23 | Discharge: 2016-07-23 | Disposition: A | Discharge status: Home / Self Care | Payer: Medicare Other | Source: Ambulatory Visit | Attending: Radiation Oncology | Admitting: Radiation Oncology

## 2016-07-23 DIAGNOSIS — Z510 Encounter for antineoplastic radiation therapy: Secondary | ICD-10-CM | POA: Diagnosis not present

## 2016-07-23 DIAGNOSIS — C61 Malignant neoplasm of prostate: Secondary | ICD-10-CM | POA: Diagnosis not present

## 2016-07-23 DIAGNOSIS — C7951 Secondary malignant neoplasm of bone: Secondary | ICD-10-CM | POA: Diagnosis not present

## 2016-07-23 DIAGNOSIS — C8514 Unspecified B-cell lymphoma, lymph nodes of axilla and upper limb: Secondary | ICD-10-CM | POA: Diagnosis not present

## 2016-07-24 ENCOUNTER — Ambulatory Visit
Admission: RE | Admit: 2016-07-24 | Discharge: 2016-07-24 | Disposition: A | Discharge status: Home / Self Care | Payer: Medicare Other | Source: Ambulatory Visit | Attending: Radiation Oncology | Admitting: Radiation Oncology

## 2016-07-24 DIAGNOSIS — C61 Malignant neoplasm of prostate: Secondary | ICD-10-CM | POA: Diagnosis not present

## 2016-07-24 DIAGNOSIS — C7951 Secondary malignant neoplasm of bone: Secondary | ICD-10-CM | POA: Diagnosis not present

## 2016-07-24 DIAGNOSIS — C8514 Unspecified B-cell lymphoma, lymph nodes of axilla and upper limb: Secondary | ICD-10-CM | POA: Diagnosis not present

## 2016-07-24 DIAGNOSIS — Z510 Encounter for antineoplastic radiation therapy: Secondary | ICD-10-CM | POA: Diagnosis not present

## 2016-07-25 ENCOUNTER — Ambulatory Visit
Admission: RE | Admit: 2016-07-25 | Discharge: 2016-07-25 | Disposition: A | Discharge status: Home / Self Care | Payer: Medicare Other | Source: Ambulatory Visit | Attending: Radiation Oncology | Admitting: Radiation Oncology

## 2016-07-25 DIAGNOSIS — C8514 Unspecified B-cell lymphoma, lymph nodes of axilla and upper limb: Secondary | ICD-10-CM | POA: Diagnosis not present

## 2016-07-25 DIAGNOSIS — C61 Malignant neoplasm of prostate: Secondary | ICD-10-CM | POA: Diagnosis not present

## 2016-07-25 DIAGNOSIS — C7951 Secondary malignant neoplasm of bone: Secondary | ICD-10-CM | POA: Diagnosis not present

## 2016-07-25 DIAGNOSIS — Z510 Encounter for antineoplastic radiation therapy: Secondary | ICD-10-CM | POA: Diagnosis not present

## 2016-07-28 ENCOUNTER — Ambulatory Visit
Admission: RE | Admit: 2016-07-28 | Discharge: 2016-07-28 | Disposition: A | Discharge status: Home / Self Care | Payer: Medicare Other | Source: Ambulatory Visit | Attending: Radiation Oncology | Admitting: Radiation Oncology

## 2016-07-28 DIAGNOSIS — C8338 Diffuse large B-cell lymphoma, lymph nodes of multiple sites: Secondary | ICD-10-CM | POA: Diagnosis not present

## 2016-07-28 DIAGNOSIS — Z510 Encounter for antineoplastic radiation therapy: Secondary | ICD-10-CM | POA: Diagnosis not present

## 2016-07-28 DIAGNOSIS — C8514 Unspecified B-cell lymphoma, lymph nodes of axilla and upper limb: Secondary | ICD-10-CM | POA: Diagnosis not present

## 2016-07-28 DIAGNOSIS — C61 Malignant neoplasm of prostate: Secondary | ICD-10-CM | POA: Diagnosis not present

## 2016-07-28 DIAGNOSIS — C7951 Secondary malignant neoplasm of bone: Secondary | ICD-10-CM | POA: Diagnosis not present

## 2016-08-11 ENCOUNTER — Telehealth: Payer: Self-pay

## 2016-08-11 ENCOUNTER — Ambulatory Visit (INDEPENDENT_AMBULATORY_CARE_PROVIDER_SITE_OTHER): Payer: Medicare Other | Admitting: Family Medicine

## 2016-08-11 ENCOUNTER — Encounter: Payer: Self-pay | Admitting: Family Medicine

## 2016-08-11 VITALS — BP 110/54 | HR 74 | Temp 98.4°F | Resp 16 | Wt 186.8 lb

## 2016-08-11 DIAGNOSIS — L03116 Cellulitis of left lower limb: Secondary | ICD-10-CM | POA: Diagnosis not present

## 2016-08-11 MED ORDER — CEPHALEXIN 500 MG PO CAPS: 500.0000 mg | ORAL_CAPSULE | Freq: Two times a day (BID) | ORAL | 1 refills | Status: DC

## 2016-08-11 NOTE — Patient Instructions (Signed)
Please keep legs elevated and stay out of the heat to get your leg swelling down. Do make an appointment with Dr. Grayland Ormond regarding headaches and back pain.

## 2016-08-11 NOTE — Telephone Encounter (Signed)
Pt's daughter, Vaughan Basta called concerned because pt is experiencing redness and swelling of LLE. Denies SOB. When asked if the extremity is warm or cool to the touch, daughter states it is in the normal to warm range. Appointment scheduled for 2:30 today. Renaldo Fiddler, CMA

## 2016-08-11 NOTE — Progress Notes (Signed)
Subjective:     Patient ID: DNAIEL VOLLER, male   DOB: [birth year], 83 y.o.   MRN: [id number]  HPI  Chief Complaint  Patient presents with  . Leg Swelling    Patient comes in office today accompanied with his daughter with concerns of redness and swelling of the left leg for the past 5 days. Daughter reports that father has mentioned pain in his great toe, but patient denies any cut or injury  . Headache    Patient reports frequent headaches over the past two weeks or more. Patient states that pain initally starts off as sharp on the right side of head and radiates to the left side. Daughter has concerns that headache is related to lymphoma.   . Back Pain    Patient complains of lower back pain that he describes as tender, patient states that pain is mostly when laying down.   States he has chronic leg swelling. Family member noticed redness on his leg and wished further evaluation. He has just completed a course of Radiation therapy for lymphoma. Also has prostate cancer with metastasis to bone. Right trochanter treated with Radiation therapy for mets. Accompanied by his daughter, Vaughan Basta, today.   Review of Systems     Objective:   Physical Exam  Constitutional: He appears well-developed and well-nourished. No distress.  Cardiovascular:  Pulses:      Dorsalis pedis pulses are 1+ on the right side, and 1+ on the left side.       Posterior tibial pulses are 1+ on the right side, and 1+ on the left side.  2/6 systolic murmur  Pulmonary/Chest: Breath sounds normal.  Skin:  Distal left lower extremity with anterior increased warmth and erythema. Bilateral legs have 2+ edema       Assessment:    1. Cellulitis of left lower extremity - cephALEXin (KEFLEX) 500 MG capsule; Take 1 capsule (500 mg total) by mouth 2 (two) times daily.  Dispense: 14 capsule; Refill: 1    Plan:   Discussed leg elevation and staying out of the heat. Discussed f/u with his oncologist about his head and back pain.

## 2016-08-12 ENCOUNTER — Ambulatory Visit: Payer: Self-pay | Admitting: Family Medicine

## 2016-08-19 ENCOUNTER — Encounter: Payer: Self-pay | Admitting: Family Medicine

## 2016-08-19 ENCOUNTER — Ambulatory Visit (INDEPENDENT_AMBULATORY_CARE_PROVIDER_SITE_OTHER): Payer: Medicare Other | Admitting: Family Medicine

## 2016-08-19 VITALS — BP 136/72 | HR 76 | Temp 98.4°F | Resp 16 | Wt 187.0 lb

## 2016-08-19 DIAGNOSIS — R600 Localized edema: Secondary | ICD-10-CM | POA: Diagnosis not present

## 2016-08-19 DIAGNOSIS — L03116 Cellulitis of left lower limb: Secondary | ICD-10-CM | POA: Diagnosis not present

## 2016-08-19 MED ORDER — FUROSEMIDE 20 MG PO TABS: 20.0000 mg | ORAL_TABLET | Freq: Every day | ORAL | 0 refills | Status: DC

## 2016-08-19 NOTE — Progress Notes (Signed)
Subjective:     Patient ID: Steven Greene, male   DOB: 04/08/1919, 83 y.o.   MRN: [id number]  HPI  Chief Complaint  Patient presents with  . Cellulitis    Follow up  States he completed the cephalexin. Bilateral legs remain swollen L > R. Reports prior hx of left ankle surgery. Accompanied by his son, Patrick Jupiter.   Review of Systems     Objective:   Physical Exam  Constitutional: He appears well-developed and well-nourished. No distress.  Musculoskeletal: He exhibits edema (bilateral 2+,).  Skin:  Left lower extremity cellulitis has resolved.       Assessment:    1. Cellulitis of left lower extremity: resolved  2. Bilateral leg edema - furosemide (LASIX) 20 MG tablet; Take 1 tablet (20 mg total) by mouth daily. As needed for leg swelling  Dispense: 14 tablet; Refill: 0    Plan:    F/u With Dr. Rosanna Randy in two weeks as scheduled.

## 2016-08-25 ENCOUNTER — Ambulatory Visit: Payer: Self-pay | Admitting: Radiation Oncology

## 2016-08-29 ENCOUNTER — Other Ambulatory Visit: Payer: Self-pay | Admitting: Family Medicine

## 2016-08-29 DIAGNOSIS — K219 Gastro-esophageal reflux disease without esophagitis: Secondary | ICD-10-CM

## 2016-09-01 ENCOUNTER — Encounter: Payer: Self-pay | Admitting: Family Medicine

## 2016-09-01 ENCOUNTER — Ambulatory Visit (INDEPENDENT_AMBULATORY_CARE_PROVIDER_SITE_OTHER): Payer: Medicare Other | Admitting: Family Medicine

## 2016-09-01 ENCOUNTER — Ambulatory Visit
Admission: RE | Admit: 2016-09-01 | Discharge: 2016-09-01 | Disposition: A | Discharge status: Home / Self Care | Payer: Medicare Other | Source: Ambulatory Visit | Attending: Radiation Oncology | Admitting: Radiation Oncology

## 2016-09-01 VITALS — BP 155/78 | HR 70 | Temp 97.2°F | Wt 181.0 lb

## 2016-09-01 VITALS — BP 162/72 | HR 66 | Temp 97.4°F | Resp 16 | Wt 192.0 lb

## 2016-09-01 DIAGNOSIS — C8334 Diffuse large B-cell lymphoma, lymph nodes of axilla and upper limb: Secondary | ICD-10-CM | POA: Diagnosis not present

## 2016-09-01 DIAGNOSIS — C61 Malignant neoplasm of prostate: Secondary | ICD-10-CM | POA: Diagnosis not present

## 2016-09-01 DIAGNOSIS — R600 Localized edema: Secondary | ICD-10-CM | POA: Diagnosis not present

## 2016-09-01 DIAGNOSIS — I10 Essential (primary) hypertension: Secondary | ICD-10-CM

## 2016-09-01 DIAGNOSIS — M542 Cervicalgia: Secondary | ICD-10-CM | POA: Diagnosis not present

## 2016-09-01 DIAGNOSIS — C7951 Secondary malignant neoplasm of bone: Secondary | ICD-10-CM

## 2016-09-01 DIAGNOSIS — Z923 Personal history of irradiation: Secondary | ICD-10-CM | POA: Diagnosis not present

## 2016-09-01 DIAGNOSIS — C8338 Diffuse large B-cell lymphoma, lymph nodes of multiple sites: Secondary | ICD-10-CM

## 2016-09-01 MED ORDER — FUROSEMIDE 20 MG PO TABS: 20.0000 mg | ORAL_TABLET | Freq: Every day | ORAL | 6 refills | Status: DC

## 2016-09-01 NOTE — Progress Notes (Signed)
Patient here for follow up. Patient states that he has pain in both legs and feet. when putting weight on his feet. He has been having intermittent pain in his right shoulder and neck.  He has been seen by his PCP for lower extremity fluid retention.

## 2016-09-01 NOTE — Progress Notes (Signed)
Subjective:  HPI Pt is here for a 2 month follow up. He reports that he is feeling alright. But he is still having swelling and pain in his feet and legs. He is taking Furosemide 20 mg daily but does not seem to think that it is working. He is also having neck pain. He told the oncologist about this and she went back to look at the scans to make sure it was not related to the cancer and she did not think it was. She thought it was arthritis.   Prior to Admission medications   Medication Sig Start Date End Date Taking? Authorizing Provider  acetaminophen (TYLENOL) 500 MG tablet Take 500 mg by mouth every 6 (six) hours as needed.    [provider]  Fluticasone-Salmeterol (ADVAIR DISKUS) 100-50 MCG/DOSE AEPB Inhale 1 puff into the lungs 2 (two) times daily. 11/21/15   Jerrol Banana., MD  furosemide (LASIX) 20 MG tablet Take 1 tablet (20 mg total) by mouth daily. As needed for leg swelling 08/19/16   Carmon Ginsberg, PA  pantoprazole (PROTONIX) 40 MG tablet Take by mouth.    [provider]  propranolol (INDERAL) 20 MG tablet TAKE ONE TABLET BY MOUTH ONCE DAILY 09/01/16   Jerrol Banana., MD    Patient Active Problem List   Diagnosis Date Noted  . DLBCL (diffuse large B cell lymphoma) ([place]) 04/20/2016  . Hypertension 12/08/2014  . Chronic obstructive airway disease ([place]) 12/08/2014  . Asthma 12/08/2014  . GERD (gastroesophageal reflux disease) 12/08/2014  . Osteoarthritis 12/08/2014  . Chronic back pain 12/08/2014  . BPH (benign prostatic hypertrophy) 12/08/2014  . Tremor 12/08/2014  . Prostate cancer metastatic to bone [facility]) 08/31/2014    Past Medical History:  Diagnosis Date  . Arthritis   . Asthma   . Cancer [facility])    prostate cancer-treated medically  . COPD (chronic obstructive pulmonary disease) ([place])   . Foot drop, left   . Hernia, inguinal, left   . HOH (hard of hearing)   . Tremors of nervous system     Social History   Social History   . Marital status: Married    Spouse name: N/A  . Number of children: N/A  . Years of education: N/A   Occupational History  . Not on file.   Social History Main Topics  . Smoking status: Never Smoker  . Smokeless tobacco: Never Used  . Alcohol use No  . Drug use: No  . Sexual activity: Not on file   Other Topics Concern  . Not on file   Social History Narrative  . No narrative on file    No Known Allergies  Review of Systems  Constitutional: Negative.   HENT: Negative.   Eyes: Negative.   Cardiovascular: Positive for leg swelling.  Gastrointestinal: Negative.   Genitourinary: Negative.   Musculoskeletal: Positive for neck pain.  Skin: Negative.   Neurological: Negative.   Endo/Heme/Allergies: Negative.   Psychiatric/Behavioral: Negative.     Immunization History  Administered Date(s) Administered  . Influenza, High Dose Seasonal PF 12/11/2014, 01/17/2016  . Pneumococcal Polysaccharide-23 02/02/2012  . Tdap 02/10/2013    Objective:  BP (!) 162/72 (BP Location: Left Arm, Patient Position: Sitting, Cuff Size: Normal)   Pulse 66   Temp 97.4 F (36.3 C) (Oral)   Resp 16   Wt 192 lb (87.1 kg)   SpO2 96%   BMI 27.55 kg/m   Physical Exam  Constitutional: He is oriented  to person, place, and time and well-developed, well-nourished, and in no distress.  HENT:  Head: Normocephalic and atraumatic.  Right Ear: External ear normal.  Left Ear: External ear normal.  Nose: Nose normal.  Eyes: Conjunctivae are normal. No scleral icterus.  Neck: No thyromegaly present.  Cardiovascular: Normal rate, regular rhythm and normal heart sounds.   Pulmonary/Chest: Effort normal and breath sounds normal.  Abdominal: Soft.  Neurological: He is alert and oriented to person, place, and time. No cranial nerve deficit.  Skin: Skin is warm and dry.  Psychiatric: Mood, memory, affect and judgment normal.    Lab Results  Component Value Date   WBC 7.1 07/22/2016   HGB 12.4  (L) 07/22/2016   HCT 36.8 (L) 07/22/2016   PLT 187 07/22/2016   GLUCOSE 124 (H) 06/26/2016   PSA 32.88 (H) 06/26/2016   INR 0.8 03/22/2014    CMP     Component Value Date/Time   NA 135 06/26/2016 1302   NA 141 08/22/2014   NA 145 03/26/2014 0424   K 4.7 06/26/2016 1302   K 3.5 03/26/2014 0424   CL 102 06/26/2016 1302   CL 110 (H) 03/26/2014 0424   CO2 25 06/26/2016 1302   CO2 28 03/26/2014 0424   GLUCOSE 124 (H) 06/26/2016 1302   GLUCOSE 99 03/26/2014 0424   BUN 23 (H) 06/26/2016 1302   BUN 14 08/22/2014   BUN 9 03/26/2014 0424   CREATININE 1.00 06/26/2016 1302   CREATININE 0.95 03/26/2014 0424   CALCIUM 9.2 06/26/2016 1302   CALCIUM 7.9 (L) 03/26/2014 0424   PROT 6.8 06/26/2016 1302   PROT 6.6 03/22/2014 1540   ALBUMIN 3.8 06/26/2016 1302   ALBUMIN 3.3 (L) 03/22/2014 1540   AST 25 06/26/2016 1302   AST 32 03/22/2014 1540   ALT 16 (L) 06/26/2016 1302   ALT 21 03/22/2014 1540   ALKPHOS 39 06/26/2016 1302   ALKPHOS 80 03/22/2014 1540   BILITOT 0.7 06/26/2016 1302   BILITOT 0.3 03/22/2014 1540   GFRNONAA >60 06/26/2016 1302   GFRNONAA >60 03/26/2014 0424   GFRNONAA >60 07/09/2012 1110   GFRAA >60 06/26/2016 1302   GFRAA >60 03/26/2014 0424   GFRAA >60 07/09/2012 1110    Assessment and Plan :  1. Bilateral leg edema  - furosemide (LASIX) 20 MG tablet; Take 1 tablet (20 mg total) by mouth daily. 1-2 tablets daily As needed for leg swelling  Dispense: 60 tablet; Refill: 6  2. Essential hypertension 3.Lymphoma 4.Metastatic Prostate cancer  I have done the exam and reviewed the above chart and it is accurate to the best of my knowledge. Development worker, community has been used in this note in any air is in the dictation or transcription are unintentional.  [facility] 09/01/2016 3:47 PM

## 2016-09-01 NOTE — Progress Notes (Signed)
Radiation Oncology Follow up Note  Name: Steven Greene   Date:   09/01/2016 MRN:  [id number] DOB: 1919/12/18    This 83 y.o. male presents to the clinic today for ne-month follow-up status post palliative radiation therapy to his left axillary lymph node secondary to diffuse B-cell lymphoma.  REFERRING PROVIDER: Jerrol Banana.,*  HPI: patient is a 83 year old male with both stage IV prostate cancer as well as diffuse B-cell lymphoma. He is seen today in routine follow-up he is one month out having completed palliative radiation therapy to large left axillary node which has decreased about 75% in size. He does have what he describes as heaviness in his legs some back and neck pain all consistent with either arthritis or secondary to his 2 disease processes. I have reviewed his PET/CT scan and bone scan see no real reason for the pain he is describing..  COMPLICATIONS OF TREATMENT: none  FOLLOW UP COMPLIANCE: keeps appointments   PHYSICAL EXAM:  BP (!) 155/78 (BP Location: Left Arm, Patient Position: Sitting, Cuff Size: Normal)   Pulse 70   Temp 97.2 F (36.2 C) (Tympanic)   Wt 181 lb (82.1 kg)   BMI 25.97 kg/m  lderly male in NAD. Left axillary lymph node has about 25% residual volume left fairly asymptomatic at this time. Well-developed well-nourished patient in NAD. HEENT reveals PERLA, EOMI, discs not visualized.  Oral cavity is clear. No oral mucosal lesions are identified. Neck is clear without evidence of cervical or supraclavicular adenopathy. Lungs are clear to A&P. Cardiac examination is essentially unremarkable with regular rate and rhythm without murmur rub or thrill. Abdomen is benign with no organomegaly or masses noted. Motor sensory and DTR levels are equal and symmetric in the upper and lower extremities. Cranial nerves II through XII are grossly intact. Proprioception is intact. No peripheral adenopathy or edema is identified. No motor or sensory levels are noted.  Crude visual fields are within normal range.  RADIOLOGY RESULTS: bone scan and PET/CT scans both reviewed  PLAN: present time patient is fairly stable. I am going to turn follow-up care over to medical oncology. I be happy to reevaluate the patient at any time for further palliative treatment.  I would like to take this opportunity to thank you for allowing me to participate in the care of your patient.Armstead Peaks., MD

## 2016-09-30 ENCOUNTER — Ambulatory Visit: Payer: Self-pay | Admitting: Oncology

## 2016-09-30 ENCOUNTER — Other Ambulatory Visit: Payer: Self-pay

## 2016-09-30 ENCOUNTER — Ambulatory Visit: Payer: Self-pay

## 2016-10-21 ENCOUNTER — Telehealth: Payer: Self-pay | Admitting: Family Medicine

## 2016-10-21 NOTE — Telephone Encounter (Signed)
Pt daughter states the Rx for Fluticasone-Salmeterol (ADVAIR DISKUS) 100-50 MCG/DOSE AEPB   is really expensive.  She is asking if we have an samples?  If there are not samples she is asking if this can be changed to something different.  IP#[phone]

## 2016-10-21 NOTE — Telephone Encounter (Signed)
Please review-aa 

## 2016-10-24 NOTE — Telephone Encounter (Signed)
We don't have samples. They will need to check their insurance formulary to see if there is an alternative with a lower copay.

## 2016-10-24 NOTE — Telephone Encounter (Signed)
Can you please review for dr Rometta Emery

## 2016-10-27 NOTE — Telephone Encounter (Signed)
Daughter advised as below. Daughter states they spoke to insurance and the reason it was expensive because patient has not reached his deductible on the medication cost yet. Daughter states they are doing ok with the payments for now.-aa

## 2016-10-27 NOTE — Telephone Encounter (Signed)
Pt daughter is returning call.  EY#[phone]/[id number]

## 2016-10-27 NOTE — Telephone Encounter (Signed)
[id number]-aa 

## 2016-11-12 ENCOUNTER — Encounter: Payer: Self-pay | Admitting: Oncology

## 2016-11-13 ENCOUNTER — Other Ambulatory Visit: Payer: Self-pay

## 2016-11-13 DIAGNOSIS — C61 Malignant neoplasm of prostate: Secondary | ICD-10-CM

## 2016-11-13 DIAGNOSIS — C7951 Secondary malignant neoplasm of bone: Principal | ICD-10-CM

## 2016-11-27 ENCOUNTER — Encounter: Payer: Self-pay | Admitting: Oncology

## 2016-11-27 ENCOUNTER — Encounter: Payer: Self-pay | Admitting: Family Medicine

## 2016-11-28 ENCOUNTER — Ambulatory Visit
Admission: RE | Admit: 2016-11-28 | Discharge: 2016-11-28 | Disposition: A | Discharge status: Home / Self Care | Payer: Medicare Other | Source: Ambulatory Visit | Attending: Oncology | Admitting: Oncology

## 2016-11-28 DIAGNOSIS — R591 Generalized enlarged lymph nodes: Secondary | ICD-10-CM | POA: Insufficient documentation

## 2016-11-28 DIAGNOSIS — I700 Atherosclerosis of aorta: Secondary | ICD-10-CM | POA: Insufficient documentation

## 2016-11-28 DIAGNOSIS — C61 Malignant neoplasm of prostate: Secondary | ICD-10-CM

## 2016-11-28 DIAGNOSIS — C7951 Secondary malignant neoplasm of bone: Secondary | ICD-10-CM | POA: Insufficient documentation

## 2016-11-28 LAB — GLUCOSE, CAPILLARY: Glucose-Capillary: 98 mg/dL (ref 65–99)

## 2016-11-28 MED ORDER — FLUDEOXYGLUCOSE F - 18 (FDG) INJECTION
12.9300 | Freq: Once | INTRAVENOUS | Status: AC | PRN
  Administered 2016-11-28: 12.93 via INTRAVENOUS

## 2016-11-28 NOTE — Progress Notes (Signed)
[place]  [phone] [phone]  ID: [name] OB: [birth year]  MR#: [id number]  [facility]#:[id number]  Patient Care Team: Jerrol Banana., MD as PCP - General (Unknown Physician Specialty)  CHIEF COMPLAINT: Progressive stage IV prostate cancer with bony metastasis, at least stage III diffuse large B-cell lymphoma.  INTERVAL HISTORY: Patient returns to clinic today for further evaluation and discussion of his imaging results. He currently feels well and is asymptomatic. He does not complain of weakness or fatigue. His right leg/hip pain has improved. The enlarged lymph node in his left axilla is improved and no longer causes discomfort or pain. He reports no bleeding or bruising. He has no neurologic complaints. He denies any recent fevers or chills. He denies weight loss. He denies any chest pain.  He has no nausea, vomiting, constipation, or diarrhea. He has no urinary complaints. Patient offers no specific complaints today.  REVIEW OF SYSTEMS:   Review of Systems  Constitutional: Negative for fever, malaise/fatigue and weight loss.  Respiratory: Negative for cough and shortness of breath.   Cardiovascular: Negative.  Negative for chest pain and leg swelling.  Gastrointestinal: Negative.  Negative for abdominal pain, constipation, diarrhea, nausea and vomiting.  Musculoskeletal: Negative.  Negative for joint pain.  Neurological: Negative.  Negative for tingling, sensory change, weakness and headaches.  Endo/Heme/Allergies: Does not bruise/bleed easily.  Psychiatric/Behavioral: Negative.  The patient is not nervous/anxious.     As per HPI. Otherwise, a complete review of systems is negative.  PAST MEDICAL HISTORY: Past Medical History:  Diagnosis Date  . Arthritis   . Asthma   . Cancer [facility])    prostate cancer-treated medically  . COPD (chronic obstructive pulmonary disease) ([place])   . Foot drop, left   . Hernia, inguinal, left   .  HOH (hard of hearing)   . Tremors of nervous system     PAST SURGICAL HISTORY: Past Surgical History:  Procedure Laterality Date  . ANKLE FRACTURE SURGERY  2009   lt  . ANKLE FRACTURE SURGERY     rt  . EYE SURGERY     both cataracts  . HEMORRHOID SURGERY    . JOINT REPLACEMENT  2000   rt hip replacement  . WRIST FRACTURE SURGERY Bilateral     FAMILY HISTORY: Family History  Problem Relation Age of Onset  . Heart attack Father   . Tremor Father   . Cancer Sister   . Stomach cancer Brother   . Heart attack Sister   . Heart attack Sister     ADVANCED DIRECTIVES (Y/N):  N  HEALTH MAINTENANCE: Social History  Substance Use Topics  . Smoking status: Never Smoker  . Smokeless tobacco: Never Used  . Alcohol use No     Colonoscopy:  PAP:  Bone density:  Lipid panel:  No Known Allergies  Current Outpatient Prescriptions  Medication Sig Dispense Refill  . acetaminophen (TYLENOL) 500 MG tablet Take 500 mg by mouth every 6 (six) hours as needed.    . Fluticasone-Salmeterol (ADVAIR DISKUS) 100-50 MCG/DOSE AEPB Inhale 1 puff into the lungs 2 (two) times daily. 60 each 12  . propranolol (INDERAL) 20 MG tablet TAKE ONE TABLET BY MOUTH ONCE DAILY 90 tablet 3  . furosemide (LASIX) 20 MG tablet Take 1 tablet (20 mg total) by mouth daily. 1-2 tablets daily As needed for leg swelling (Patient not taking: Reported on 12/02/2016) 60 tablet 6  . pantoprazole (PROTONIX) 40 MG tablet Take by mouth.  No current facility-administered medications for this visit.     OBJECTIVE: Vitals:   12/02/16 1429  BP: (!) 144/79  Pulse: 67  Resp: 18  Temp: (!) 97.2 F (36.2 C)     Body mass index is 25.43 kg/m.    ECOG FS:1 - Symptomatic but completely ambulatory  General: Well-developed, well-nourished, no acute distress. Eyes: Pink conjunctiva, anicteric sclera. HEENT: Normocephalic, moist mucous membranes, clear oropharnyx. Lungs: Clear to auscultation bilaterally. Heart: Regular  rate and rhythm. No rubs, murmurs, or gallops. Abdomen: Soft, nontender, nondistended. No organomegaly noted, normoactive bowel sounds. Musculoskeletal: No edema, cyanosis, or clubbing. Neuro: Alert, answering all questions appropriately. Cranial nerves grossly intact. Skin: No rashes or petechiae noted. Psych: Normal affect. Lymphatics: Easily palpable left axillary lympadenopathy.  Unchanged.  LAB RESULTS:  Lab Results  Component Value Date   NA 137 12/02/2016   K 4.2 12/02/2016   CL 102 12/02/2016   CO2 28 12/02/2016   GLUCOSE 101 (H) 12/02/2016   BUN 16 12/02/2016   CREATININE 0.81 12/02/2016   CALCIUM 9.2 12/02/2016   PROT 7.1 12/02/2016   ALBUMIN 3.5 12/02/2016   AST 20 12/02/2016   ALT 9 (L) 12/02/2016   ALKPHOS 67 12/02/2016   BILITOT 0.5 12/02/2016   GFRNONAA >60 12/02/2016   GFRAA >60 12/02/2016    Lab Results  Component Value Date   WBC 6.4 12/02/2016   NEUTROABS 4.5 12/02/2016   HGB 11.0 (L) 12/02/2016   HCT 32.3 (L) 12/02/2016   MCV 90.4 12/02/2016   PLT 270 12/02/2016     STUDIES: Nm Pet Image Restag (ps) Skull Base To Thigh  Result Date: 11/28/2016 CLINICAL DATA:  Subsequent treatment strategy for prostate cancer. EXAM: NUCLEAR MEDICINE PET SKULL BASE TO THIGH TECHNIQUE: 12.9 MCi F-18 FDG was injected intravenously. Full-ring PET imaging was performed from the skull base to thigh after the radiotracer. CT data was obtained and used for attenuation correction and anatomic localization. FASTING BLOOD GLUCOSE:  Value: 98 mg/dl COMPARISON:  06/23/2016 FINDINGS: NECK: 6 mm short axis superficial lymph no inferior right neck (image 48 series 3) is hypermetabolic with SUV max = 2 point for CHEST: Index left axillary lymph nodes seen previously is no longer evident. Superficial right axillary lymph node which was hypermetabolic on the previous study has also resolved. There is a new inferior right axillary lymph node identified in the mid axillary line (image 99  series 3) measuring 2.5 x 3.5 cm and demonstrating SUV max = 46. Progression of retrocrural hypermetabolic lymphadenopathy evident with right retrocrural lymph node measuring 14 mm and demonstrating SUV max = 5.8 (image 130 series 3). Dependent atelectasis noted both lower lobes. Heart is enlarged. Insert calcium heart insert calcium thoracic ABDOMEN/PELVIS: Retroperitoneal para-aortic lymphadenopathy is progressive in the interval. Retrocaval nodal conglomeration seen on image 159 series 3 measures 4.6 x 3.2 cm and demonstrates SUV max = 29. Other hypermetabolic retroperitoneal lymphadenopathy is new and progressive in the interval. 1.9 x 2.3 cm left common iliac lymph node is similar to prior study. 3.4 x 4.4 cm right common iliac lymph node measured about 12 x 7 mm on the prior study. Hypermetabolism within this nodal conglomeration is SUV max = 29 today. Interval progression of hypermetabolic left pelvic sidewall lymphadenopathy evident. Bilateral renal atrophy with left renal cyst. There is abdominal aortic atherosclerosis without aneurysm. Gallbladder is distended. Diverticular changes noted in the left colon. SKELETON: No focal hypermetabolic activity to suggest skeletal metastasis. IMPRESSION: 1. New hypermetabolic lymph node lower right  neck. 2. Index axillary lymphadenopathy seen on the prior study is no longer evident although new hypermetabolic relatively bulky right axillary lymph node evident. 3. Interval progression of hypermetabolic retrocrural, abdominal retroperitoneal, and pelvic sidewall lymphadenopathy. 4.  Aortic Atherosclerois (ICD10-170.0) Electronically Signed   By: Misty Stanley M.D.   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  PLAN:    1. 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 Patient's most recent  PSA is trending up from 32.88 to 40.48. He does not wish any further treatment at this time. 2. At least stage III diffuse large B-cell lymphoma: PET scan results from November 28, 2016 reviewed independently and reported as above with clear progression of disease. Patient last received single agent Rituxan along with prednisone on May 27, 2016. Patient declined further aggressive chemotherapy given his advanced age. Patient is no longer taking prednisone. Repeat treatment with Rituxan only was offered, but patient has declined at this time. He has been instructed to call clinic if he changes his mind. Otherwise, return to clinic in 3 months with repeat imaging and further evaluation. 3. Anemia: Patient's hemoglobin is slightly decreased, monitor.  4. Thrombocytopenia: Resolved. 5. Bony metastasis: Nuclear med bone scan results reviewed independently. Patient has completed palliative XRT to the lesion in his right leg. PET scan results as above.  Approximately 30 minutes was spent in discussion of which greater than 50% was consultation.  Patient expressed understanding and was in agreement with this plan. He also understands that He can call clinic at any time with any questions, concerns, or complaints.   Cancer Staging DLBCL (diffuse large B cell lymphoma) (HCC) Staging form: Hodgkin and Non-Hodgkin Lymphoma, AJCC 8th Edition - Clinical stage from 04/28/2016: Stage III (Diffuse large B-cell lymphoma) - Signed by Lloyd Huger, MD on 04/28/2016  Prostate cancer metastatic to bone [facility]) Staging form: Prostate, AJCC 7th Edition - Clinical stage from 09/24/2014: Stage IV (TX, NX, M1b, PSA: 20 or greater, Gleason X - Gleason score cannot be processed) - Signed by Lloyd Huger, MD on 09/24/2014  Lloyd Huger, MD 12/05/16 12:57 PM

## 2016-12-01 ENCOUNTER — Other Ambulatory Visit: Payer: Self-pay

## 2016-12-01 MED ORDER — FLUTICASONE-SALMETEROL 100-50 MCG/DOSE IN AEPB: 1.0000 | INHALATION_SPRAY | Freq: Two times a day (BID) | RESPIRATORY_TRACT | 12 refills | Status: AC

## 2016-12-02 ENCOUNTER — Inpatient Hospital Stay: Payer: Medicare Other

## 2016-12-02 ENCOUNTER — Inpatient Hospital Stay: Payer: Medicare Other | Attending: Oncology | Admitting: Oncology

## 2016-12-02 VITALS — BP 144/79 | HR 67 | Temp 97.2°F | Resp 18 | Wt 177.2 lb

## 2016-12-02 DIAGNOSIS — M21372 Foot drop, left foot: Secondary | ICD-10-CM | POA: Diagnosis not present

## 2016-12-02 DIAGNOSIS — R251 Tremor, unspecified: Secondary | ICD-10-CM

## 2016-12-02 DIAGNOSIS — M129 Arthropathy, unspecified: Secondary | ICD-10-CM | POA: Diagnosis not present

## 2016-12-02 DIAGNOSIS — C7951 Secondary malignant neoplasm of bone: Secondary | ICD-10-CM

## 2016-12-02 DIAGNOSIS — Z808 Family history of malignant neoplasm of other organs or systems: Secondary | ICD-10-CM

## 2016-12-02 DIAGNOSIS — C8338 Diffuse large B-cell lymphoma, lymph nodes of multiple sites: Secondary | ICD-10-CM

## 2016-12-02 DIAGNOSIS — C61 Malignant neoplasm of prostate: Secondary | ICD-10-CM | POA: Diagnosis not present

## 2016-12-02 DIAGNOSIS — D649 Anemia, unspecified: Secondary | ICD-10-CM | POA: Diagnosis not present

## 2016-12-02 DIAGNOSIS — Z79899 Other long term (current) drug therapy: Secondary | ICD-10-CM | POA: Diagnosis not present

## 2016-12-02 DIAGNOSIS — C8330 Diffuse large B-cell lymphoma, unspecified site: Secondary | ICD-10-CM | POA: Diagnosis not present

## 2016-12-02 DIAGNOSIS — N281 Cyst of kidney, acquired: Secondary | ICD-10-CM

## 2016-12-02 DIAGNOSIS — K4090 Unilateral inguinal hernia, without obstruction or gangrene, not specified as recurrent: Secondary | ICD-10-CM | POA: Diagnosis not present

## 2016-12-02 DIAGNOSIS — J449 Chronic obstructive pulmonary disease, unspecified: Secondary | ICD-10-CM | POA: Diagnosis not present

## 2016-12-02 DIAGNOSIS — I700 Atherosclerosis of aorta: Secondary | ICD-10-CM

## 2016-12-02 LAB — CBC WITH DIFFERENTIAL/PLATELET
Basophils Absolute: 0.1 10*3/uL (ref 0–0.1)
Basophils Relative: 2 %
EOS ABS: 0.4 10*3/uL (ref 0–0.7)
Eosinophils Relative: 6 %
HEMATOCRIT: 32.3 % — AB (ref 40.0–52.0)
HEMOGLOBIN: 11 g/dL — AB (ref 13.0–18.0)
LYMPHS ABS: 0.9 10*3/uL — AB (ref 1.0–3.6)
LYMPHS PCT: 13 %
MCH: 30.7 pg (ref 26.0–34.0)
MCHC: 34 g/dL (ref 32.0–36.0)
MCV: 90.4 fL (ref 80.0–100.0)
Monocytes Absolute: 0.6 10*3/uL (ref 0.2–1.0)
Monocytes Relative: 10 %
NEUTROS ABS: 4.5 10*3/uL (ref 1.4–6.5)
NEUTROS PCT: 69 %
Platelets: 270 10*3/uL (ref 150–440)
RBC: 3.58 MIL/uL — AB (ref 4.40–5.90)
RDW: 14.6 % — ABNORMAL HIGH (ref 11.5–14.5)
WBC: 6.4 10*3/uL (ref 3.8–10.6)

## 2016-12-02 LAB — COMPREHENSIVE METABOLIC PANEL
ALT: 9 U/L — AB (ref 17–63)
AST: 20 U/L (ref 15–41)
Albumin: 3.5 g/dL (ref 3.5–5.0)
Alkaline Phosphatase: 67 U/L (ref 38–126)
Anion gap: 7 (ref 5–15)
BILIRUBIN TOTAL: 0.5 mg/dL (ref 0.3–1.2)
BUN: 16 mg/dL (ref 6–20)
CALCIUM: 9.2 mg/dL (ref 8.9–10.3)
CHLORIDE: 102 mmol/L (ref 101–111)
CO2: 28 mmol/L (ref 22–32)
CREATININE: 0.81 mg/dL (ref 0.61–1.24)
Glucose, Bld: 101 mg/dL — ABNORMAL HIGH (ref 65–99)
Potassium: 4.2 mmol/L (ref 3.5–5.1)
Sodium: 137 mmol/L (ref 135–145)
TOTAL PROTEIN: 7.1 g/dL (ref 6.5–8.1)

## 2016-12-02 LAB — PSA: PROSTATIC SPECIFIC ANTIGEN: 40.48 ng/mL — AB (ref 0.00–4.00)

## 2016-12-07 ENCOUNTER — Encounter: Payer: Self-pay | Admitting: Family Medicine

## 2016-12-08 ENCOUNTER — Telehealth: Payer: Self-pay

## 2016-12-08 NOTE — Telephone Encounter (Signed)
Purpose of coming in for visit on Sept 20 - earlier than originally suggested: Daddy very frustrated by pain which Dr. Grayland Ormond says isn't cancer related. If it isn't cancer, he wants to know what is it? He's wondering if he should take Rituxan for the cancer again if it isn't going to help pain. IMPORTANT TO NOTE that the pain is still episodic, not constant. Still only taking Ibuprofin. I'm thinking it might be time to discuss hospice or palliative care alternatives.(?) He values your opinion a lot.    Sent through [employer], RMA

## 2016-12-11 ENCOUNTER — Encounter: Payer: Self-pay | Admitting: Family Medicine

## 2016-12-11 ENCOUNTER — Ambulatory Visit (INDEPENDENT_AMBULATORY_CARE_PROVIDER_SITE_OTHER): Payer: Medicare Other | Admitting: Family Medicine

## 2016-12-11 ENCOUNTER — Ambulatory Visit
Admission: RE | Admit: 2016-12-11 | Discharge: 2016-12-11 | Disposition: A | Discharge status: Home / Self Care | Payer: Medicare Other | Source: Ambulatory Visit | Attending: Family Medicine | Admitting: Family Medicine

## 2016-12-11 VITALS — BP 142/76 | HR 76 | Temp 97.4°F | Resp 14 | Wt 178.0 lb

## 2016-12-11 DIAGNOSIS — G8929 Other chronic pain: Secondary | ICD-10-CM

## 2016-12-11 DIAGNOSIS — M545 Low back pain: Secondary | ICD-10-CM | POA: Diagnosis not present

## 2016-12-11 DIAGNOSIS — M4856XA Collapsed vertebra, not elsewhere classified, lumbar region, initial encounter for fracture: Secondary | ICD-10-CM | POA: Diagnosis not present

## 2016-12-11 DIAGNOSIS — Z23 Encounter for immunization: Secondary | ICD-10-CM

## 2016-12-11 DIAGNOSIS — I700 Atherosclerosis of aorta: Secondary | ICD-10-CM | POA: Insufficient documentation

## 2016-12-11 DIAGNOSIS — M5440 Lumbago with sciatica, unspecified side: Secondary | ICD-10-CM | POA: Diagnosis not present

## 2016-12-11 DIAGNOSIS — M542 Cervicalgia: Secondary | ICD-10-CM | POA: Diagnosis not present

## 2016-12-11 DIAGNOSIS — K219 Gastro-esophageal reflux disease without esophagitis: Secondary | ICD-10-CM | POA: Diagnosis not present

## 2016-12-11 DIAGNOSIS — M5030 Other cervical disc degeneration, unspecified cervical region: Secondary | ICD-10-CM | POA: Insufficient documentation

## 2016-12-11 DIAGNOSIS — M8580 Other specified disorders of bone density and structure, unspecified site: Secondary | ICD-10-CM | POA: Diagnosis not present

## 2016-12-11 DIAGNOSIS — M4802 Spinal stenosis, cervical region: Secondary | ICD-10-CM | POA: Diagnosis not present

## 2016-12-11 DIAGNOSIS — M4312 Spondylolisthesis, cervical region: Secondary | ICD-10-CM | POA: Diagnosis not present

## 2016-12-11 MED ORDER — MELOXICAM 7.5 MG PO TABS: 7.5000 mg | ORAL_TABLET | Freq: Every day | ORAL | 0 refills | Status: DC

## 2016-12-11 MED ORDER — RANITIDINE HCL 150 MG PO TABS: 150.0000 mg | ORAL_TABLET | Freq: Two times a day (BID) | ORAL | 11 refills | Status: DC

## 2016-12-11 NOTE — Progress Notes (Signed)
Patient: Steven Greene Male    DOB: 14-Jan-1920   83 y.o.   MRN: [id number] Visit Date: 12/11/2016  Today's Provider: Wilhemena Durie, MD   Chief Complaint  Patient presents with  . Neck Pain  . Groin Pain   Subjective:    HPI Pt is here to talk about his cancer treatments and his neck pain, shoulder pain and groin pain. He has discussed this with his oncologist and they say the pain is not related to the cancer. His neck pain and shoulder pain is mostly in the mornings and at night. His groin and around to his back and the top of his tail bone. He says this happens more when he is sitting down. He has been taking tylenol. Daughter says the neck and shoulder pain has been going on for a while but the groin pain is more recent. Daughter says that back in august pt was having lower abdominal pain and was nauseated and that he told her the pain was higher into his stomach.        No Known Allergies   Current Outpatient Prescriptions:  .  acetaminophen (TYLENOL) 500 MG tablet, Take 500 mg by mouth every 6 (six) hours as needed., Disp: , Rfl:  .  Fluticasone-Salmeterol (ADVAIR DISKUS) 100-50 MCG/DOSE AEPB, Inhale 1 puff into the lungs 2 (two) times daily., Disp: 60 each, Rfl: 12 .  pantoprazole (PROTONIX) 40 MG tablet, Take by mouth., Disp: , Rfl:  .  propranolol (INDERAL) 20 MG tablet, TAKE ONE TABLET BY MOUTH ONCE DAILY, Disp: 90 tablet, Rfl: 3 .  furosemide (LASIX) 20 MG tablet, Take 1 tablet (20 mg total) by mouth daily. 1-2 tablets daily As needed for leg swelling (Patient not taking: Reported on 12/02/2016), Disp: 60 tablet, Rfl: 6  Review of Systems  Constitutional: Negative.   HENT: Negative.   Eyes: Negative.   Respiratory: Negative.   Cardiovascular: Negative.   Gastrointestinal: Negative.   Endocrine: Negative.   Genitourinary: Negative.   Musculoskeletal: Positive for arthralgias, back pain, gait problem and neck pain.  Allergic/Immunologic: Negative.     Hematological: Negative.   Psychiatric/Behavioral: Negative.     Social History  Substance Use Topics  . Smoking status: Never Smoker  . Smokeless tobacco: Never Used  . Alcohol use No   Objective:   BP (!) 142/76 (BP Location: Left Arm, Patient Position: Sitting, Cuff Size: Normal)   Pulse 76   Temp (!) 97.4 F (36.3 C) (Oral)   Resp 14   Wt 178 lb (80.7 kg)   BMI 25.54 kg/m  Vitals:   12/11/16 1441  BP: (!) 142/76  Pulse: 76  Resp: 14  Temp: (!) 97.4 F (36.3 C)  TempSrc: Oral  Weight: 178 lb (80.7 kg)     Physical Exam  Constitutional: He is oriented to person, place, and time. He appears well-developed and well-nourished.  HENT:  Head: Normocephalic and atraumatic.  Eyes: Pupils are equal, round, and reactive to light. Conjunctivae and EOM are normal. No scleral icterus.  Neck: Normal range of motion. Neck supple. No thyromegaly present.  Cardiovascular: Normal rate, regular rhythm, normal heart sounds and intact distal pulses.   Pulmonary/Chest: Effort normal and breath sounds normal.  Abdominal: Soft. Bowel sounds are normal.  Musculoskeletal: Normal range of motion.  Neurological: He is alert and oriented to person, place, and time. He has normal reflexes.  Skin: Skin is warm and dry.  Psychiatric: He has a normal  mood and affect. His behavior is normal. Judgment and thought content normal.        Assessment & Plan:     1. Need for influenza vaccination  - Flu vaccine HIGH DOSE PF  2. Need for pneumococcal vaccination  - Pneumococcal conjugate vaccine 13-valent IM  3. Neck pain More than 25 minutes spent in counseling regarding these issues. - DG Cervical Spine Complete; Future - Sedimentation rate - meloxicam (MOBIC) 7.5 MG tablet; Take 1 tablet (7.5 mg total) by mouth daily.  Dispense: 30 tablet; Refill: 0  4. Chronic low back pain, unspecified back pain laterality, with sciatica presence unspecified  - DG Lumbar Spine Complete; Future -  meloxicam (MOBIC) 7.5 MG tablet; Take 1 tablet (7.5 mg total) by mouth daily.  Dispense: 30 tablet; Refill: 0  5. Gastroesophageal reflux disease, esophagitis presence not specified  - ranitidine (ZANTAC) 150 MG tablet; Take 1 tablet (150 mg total) by mouth 2 (two) times daily.  Dispense: 60 tablet; Refill: 11 6. Metastatic prostate cancer 7. Lymphoma Encouraged patient to follow up with oncology for treatment of lymphoma which has been successful. Long-term prognosis is poor but short-term prognosis is unknown at this time.     HPI, Exam, and A&P Transcribed under the direction and in the presence of Richard L. Cranford Mon, MD  Electronically Signed: Katina Dung, CMA  I have done the exam and reviewed the above chart and it is accurate to the best of my knowledge. Development worker, community has been used in this note in any air is in the dictation or transcription are unintentional.  Wilhemena Durie, MD  [place]

## 2016-12-12 LAB — SEDIMENTATION RATE: SED RATE: 68 mm/h — AB (ref 0–20)

## 2016-12-16 ENCOUNTER — Telehealth: Payer: Self-pay

## 2016-12-16 MED ORDER — PREDNISONE 10 MG PO TABS: ORAL_TABLET | ORAL | 0 refills | Status: DC

## 2016-12-16 NOTE — Telephone Encounter (Signed)
Spoke with Cherly Hensen, patient's daughter and advised her of patient's xray and lab results. They want to proceed with Prednisone treatment. Per Dr Rosanna Randy take prednisone 10 mg 3 tablets daily for 2 weeks then go down to 2 tablets daily until we see patient back for follow up on 01/20/17. Stop Meloxicam. Daughter advised all of this.-Anastasiya V Hopkins, RMA

## 2016-12-17 ENCOUNTER — Telehealth: Payer: Self-pay | Admitting: *Deleted

## 2016-12-17 NOTE — Telephone Encounter (Signed)
Vaughan Basta informed of this and will await call from scheduler

## 2016-12-17 NOTE — Telephone Encounter (Signed)
Return to clinic next week for lab, M.D., and cycle 1 of 4 of weekly Rituxan.

## 2016-12-17 NOTE — Telephone Encounter (Signed)
Daughter called to states that patient wants more infusions/ treatment. Please advise what and when

## 2016-12-22 ENCOUNTER — Ambulatory Visit: Payer: Self-pay

## 2016-12-22 ENCOUNTER — Ambulatory Visit: Payer: Self-pay | Admitting: Oncology

## 2016-12-22 ENCOUNTER — Other Ambulatory Visit: Payer: Self-pay

## 2016-12-22 NOTE — Progress Notes (Signed)
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  INTERVAL HISTORY: Patient returns to clinic today for further evaluation and initiation of cycle 1 of 4 of weekly Rituxan. She continues to feel well and is asymptomatic. He does not complain of weakness or fatigue. He reports no bleeding or bruising. He has no neurologic complaints. He denies any recent fevers or chills. He denies weight loss. He denies any chest pain, shortness of breath, cough, or hemoptysis.  He has no nausea, vomiting, constipation, or diarrhea. He has no urinary complaints. Patient offers no specific complaints today.  REVIEW OF SYSTEMS:   Review of Systems  Constitutional: Negative for fever, malaise/fatigue and weight loss.  Respiratory: Negative for cough and shortness of breath.   Cardiovascular: Negative.  Negative for chest pain and leg swelling.  Gastrointestinal: Negative.  Negative for abdominal pain, constipation, diarrhea, nausea and vomiting.  Musculoskeletal: Negative.  Negative for joint pain.  Neurological: Negative.  Negative for tingling, sensory change, weakness and headaches.  Endo/Heme/Allergies: Does not bruise/bleed easily.  Psychiatric/Behavioral: Negative.  The patient is not nervous/anxious.     As per HPI. Otherwise, a complete review of systems is negative.  PAST MEDICAL HISTORY: Past Medical History:  Diagnosis Date  . Arthritis   . Asthma   . Cancer [facility])    prostate cancer-treated medically  . COPD (chronic obstructive pulmonary disease) ([place])   . Foot drop, left   . Hernia, inguinal, left   . HOH (hard of hearing)   . Tremors of nervous system     PAST SURGICAL  HISTORY: Past Surgical History:  Procedure Laterality Date  . ANKLE FRACTURE SURGERY  2009   lt  . ANKLE FRACTURE SURGERY     rt  . EYE SURGERY     both cataracts  . HEMORRHOID SURGERY    . JOINT REPLACEMENT  2000   rt hip replacement  . WRIST FRACTURE SURGERY Bilateral     FAMILY HISTORY: Family History  Problem Relation Age of Onset  . Heart attack Father   . Tremor Father   . Cancer Sister   . Stomach cancer Brother   . Heart attack Sister   . Heart attack Sister     ADVANCED DIRECTIVES (Y/N):  N  HEALTH MAINTENANCE: Social History  Substance Use Topics  . Smoking status: Never Smoker  . Smokeless tobacco: Never Used  . Alcohol use No     Colonoscopy:  PAP:  Bone density:  Lipid panel:  No Known Allergies  Current Outpatient Prescriptions  Medication Sig Dispense Refill  . Fluticasone-Salmeterol (ADVAIR DISKUS) 100-50 MCG/DOSE AEPB Inhale 1 puff into the lungs 2 (two) times daily. 60 each 12  . predniSONE (DELTASONE) 10 MG tablet Take 3 tablets daily for 2 weeks then take 2 tablets daily 75 tablet 0  . propranolol (INDERAL) 20 MG tablet TAKE ONE TABLET BY MOUTH ONCE DAILY 90 tablet 3  . ranitidine (ZANTAC) 150 MG tablet Take 1 tablet (150 mg total) by mouth 2 (two) times daily. 60 tablet 11  . acetaminophen (TYLENOL) 500 MG tablet Take 500 mg by mouth every 6 (six) hours as needed.    . pantoprazole (PROTONIX) 40 MG tablet Take by mouth.  No current facility-administered medications for this visit.     OBJECTIVE: Vitals:   12/25/16 0954  BP: (!) 157/74  Resp: 18  Temp: (!) 97.4 F (36.3 C)     Body mass index is 24.97 kg/m.    ECOG FS:1 - Symptomatic but completely ambulatory  General: Well-developed, well-nourished, no acute distress. Eyes: Pink conjunctiva, anicteric sclera. HEENT: Normocephalic, moist mucous membranes, clear oropharnyx. Lungs: Clear to auscultation bilaterally. Heart: Regular rate and rhythm. No rubs, murmurs, or  gallops. Abdomen: Soft, nontender, nondistended. No organomegaly noted, normoactive bowel sounds. Musculoskeletal: No edema, cyanosis, or clubbing. Neuro: Alert, answering all questions appropriately. Cranial nerves grossly intact. Skin: No rashes or petechiae noted. Psych: Normal affect. Lymphatics: Easily palpable left axillary lympadenopathy.  Unchanged.  LAB RESULTS:  Lab Results  Component Value Date   NA 138 12/25/2016   K 4.1 12/25/2016   CL 102 12/25/2016   CO2 30 12/25/2016   GLUCOSE 106 (H) 12/25/2016   BUN 23 (H) 12/25/2016   CREATININE 0.87 12/25/2016   CALCIUM 9.2 12/25/2016   PROT 6.6 12/25/2016   ALBUMIN 3.7 12/25/2016   AST 16 12/25/2016   ALT 8 (L) 12/25/2016   ALKPHOS 52 12/25/2016   BILITOT 0.5 12/25/2016   GFRNONAA >60 12/25/2016   GFRAA >60 12/25/2016    Lab Results  Component Value Date   WBC 7.1 12/25/2016   NEUTROABS 5.6 12/25/2016   HGB 11.5 (L) 12/25/2016   HCT 34.4 (L) 12/25/2016   MCV 90.9 12/25/2016   PLT 222 12/25/2016     STUDIES: Dg Cervical Spine Complete  Result Date: 12/12/2016 CLINICAL DATA:  Right-sided neck pain for several months without known injury. EXAM: CERVICAL SPINE - COMPLETE 4+ VIEW COMPARISON:  Radiographs of November 10, 2013. FINDINGS: Stable grade 1 anterolisthesis of C4-5 is noted secondary to posterior facet joint hypertrophy. Moderate degenerative disc disease is noted at C5-6. No fracture is noted. No prevertebral soft tissue swelling is noted. Mild diffuse osteopenia is noted. Mild degenerative disc disease is noted at C3-4 and C6-7. Mild bilateral neural foraminal stenosis is noted at C5-6 and C6-7 secondary to uncovertebral spurring. IMPRESSION: Multilevel degenerative disc disease is noted as described above. No acute abnormality seen in the cervical spine. Electronically Signed   By: Marijo Conception, M.D.   On: 12/12/2016 09:33   Dg Lumbar Spine Complete  Result Date: 12/12/2016 CLINICAL DATA:  Low back pain  without known injury. EXAM: LUMBAR SPINE - COMPLETE 4+ VIEW COMPARISON:  Radiographs of July 17, 2014. FINDINGS: Stable old compression deformities of L2 and L3 vertebral bodies are noted. No acute fracture or spondylolisthesis is noted. Moderate degenerative disc disease is noted at L4-5. Atherosclerosis of thoracic aorta is noted. Diffuse osteopenia is noted. Degenerative changes are seen involving posterior facet joints of L5-S1. IMPRESSION: Stable old compression fractures of L2 and L3. Degenerative changes as described above. Aortic atherosclerosis. No acute abnormality seen in the lumbar spine. Electronically Signed   By: Marijo Conception, M.D.   On: 12/12/2016 09:36   Nm Pet Image Restag (ps) Skull Base To Thigh  Result Date: 11/28/2016 CLINICAL DATA:  Subsequent treatment strategy for prostate cancer. EXAM: NUCLEAR MEDICINE PET SKULL BASE TO THIGH TECHNIQUE: 12.9 MCi F-18 FDG was injected intravenously. Full-ring PET imaging was performed from the skull base to thigh after the radiotracer. CT data was obtained and used for attenuation correction and anatomic localization. FASTING BLOOD GLUCOSE:  Value: 98 mg/dl COMPARISON:  06/23/2016 FINDINGS: NECK: 6 mm  short axis superficial lymph no inferior right neck (image 48 series 3) is hypermetabolic with SUV max = 2 point for CHEST: Index left axillary lymph nodes seen previously is no longer evident. Superficial right axillary lymph node which was hypermetabolic on the previous study has also resolved. There is a new inferior right axillary lymph node identified in the mid axillary line (image 99 series 3) measuring 2.5 x 3.5 cm and demonstrating SUV max = 46. Progression of retrocrural hypermetabolic lymphadenopathy evident with right retrocrural lymph node measuring 14 mm and demonstrating SUV max = 5.8 (image 130 series 3). Dependent atelectasis noted both lower lobes. Heart is enlarged. Insert calcium heart insert calcium thoracic ABDOMEN/PELVIS:  Retroperitoneal para-aortic lymphadenopathy is progressive in the interval. Retrocaval nodal conglomeration seen on image 159 series 3 measures 4.6 x 3.2 cm and demonstrates SUV max = 29. Other hypermetabolic retroperitoneal lymphadenopathy is new and progressive in the interval. 1.9 x 2.3 cm left common iliac lymph node is similar to prior study. 3.4 x 4.4 cm right common iliac lymph node measured about 12 x 7 mm on the prior study. Hypermetabolism within this nodal conglomeration is SUV max = 29 today. Interval progression of hypermetabolic left pelvic sidewall lymphadenopathy evident. Bilateral renal atrophy with left renal cyst. There is abdominal aortic atherosclerosis without aneurysm. Gallbladder is distended. Diverticular changes noted in the left colon. SKELETON: No focal hypermetabolic activity to suggest skeletal metastasis. IMPRESSION: 1. New hypermetabolic lymph node lower right neck. 2. Index axillary lymphadenopathy seen on the prior study is no longer evident although new hypermetabolic relatively bulky right axillary lymph node evident. 3. Interval progression of hypermetabolic retrocrural, abdominal retroperitoneal, and pelvic sidewall lymphadenopathy. 4.  Aortic Atherosclerois (ICD10-170.0) Electronically Signed   By: Misty Stanley M.D.   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  PLAN:    1. 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 Patient's most recent PSA is trending up from 32.88 to 40.48. He does not wish any further treatment at this time. 2. At least stage III diffuse large B-cell lymphoma: PET scan results from November 28, 2016 reviewed independently and reported as above with clear progression of disease. Patient last received single agent Rituxan along with prednisone on May 27, 2016. Patient  declined further aggressive chemotherapy given his advanced age. Proceed with cycle 1 of 4 of weekly Rituxan today. Continue prednisone as prescribed. Return to clinic in 1 week for further evaluation and consideration of cycle 2  3. Anemia: Patient's hemoglobin is slightly decreased, monitor.  4. Thrombocytopenia: Resolved. 5. Bony metastasis: Nuclear med bone scan results reviewed independently. Patient has completed palliative XRT to the lesion in his right leg. PET scan results as above.  Approximately 30 minutes was spent in discussion of which greater than 50% was consultation.  Patient expressed understanding and was in agreement with this plan. He also understands that He can call clinic at any time with any questions, concerns, or complaints.   Cancer Staging DLBCL (diffuse large B cell lymphoma) (HCC) Staging form: Hodgkin and Non-Hodgkin Lymphoma, AJCC 8th Edition - Clinical stage from 04/28/2016: Stage III (Diffuse large B-cell lymphoma) - Signed by Lloyd Huger, MD on 04/28/2016  Prostate cancer metastatic to bone [facility]) Staging form: Prostate, AJCC 7th Edition - Clinical stage from 09/24/2014: Stage IV (TX, NX, M1b, PSA: 20  or greater, Gleason X - Gleason score cannot be processed) - Signed by Lloyd Huger, MD on 09/24/2014  Lloyd Huger, MD 12/26/16 9:58 AM

## 2016-12-24 ENCOUNTER — Other Ambulatory Visit: Payer: Self-pay | Admitting: Oncology

## 2016-12-24 DIAGNOSIS — C8338 Diffuse large B-cell lymphoma, lymph nodes of multiple sites: Secondary | ICD-10-CM

## 2016-12-25 ENCOUNTER — Inpatient Hospital Stay: Payer: Medicare Other | Attending: Oncology

## 2016-12-25 ENCOUNTER — Inpatient Hospital Stay (HOSPITAL_BASED_OUTPATIENT_CLINIC_OR_DEPARTMENT_OTHER): Payer: Medicare Other | Admitting: Oncology

## 2016-12-25 ENCOUNTER — Other Ambulatory Visit: Payer: Self-pay

## 2016-12-25 ENCOUNTER — Ambulatory Visit: Payer: Self-pay

## 2016-12-25 ENCOUNTER — Inpatient Hospital Stay: Payer: Medicare Other

## 2016-12-25 ENCOUNTER — Ambulatory Visit: Payer: Self-pay | Admitting: Oncology

## 2016-12-25 VITALS — BP 133/65 | HR 66 | Resp 18

## 2016-12-25 VITALS — BP 157/74 | Temp 97.4°F | Resp 18 | Wt 174.0 lb

## 2016-12-25 DIAGNOSIS — J449 Chronic obstructive pulmonary disease, unspecified: Secondary | ICD-10-CM

## 2016-12-25 DIAGNOSIS — M5136 Other intervertebral disc degeneration, lumbar region: Secondary | ICD-10-CM

## 2016-12-25 DIAGNOSIS — I700 Atherosclerosis of aorta: Secondary | ICD-10-CM | POA: Diagnosis not present

## 2016-12-25 DIAGNOSIS — C8330 Diffuse large B-cell lymphoma, unspecified site: Secondary | ICD-10-CM

## 2016-12-25 DIAGNOSIS — R590 Localized enlarged lymph nodes: Secondary | ICD-10-CM

## 2016-12-25 DIAGNOSIS — R251 Tremor, unspecified: Secondary | ICD-10-CM | POA: Insufficient documentation

## 2016-12-25 DIAGNOSIS — C61 Malignant neoplasm of prostate: Secondary | ICD-10-CM | POA: Diagnosis not present

## 2016-12-25 DIAGNOSIS — K4090 Unilateral inguinal hernia, without obstruction or gangrene, not specified as recurrent: Secondary | ICD-10-CM | POA: Insufficient documentation

## 2016-12-25 DIAGNOSIS — M542 Cervicalgia: Secondary | ICD-10-CM | POA: Insufficient documentation

## 2016-12-25 DIAGNOSIS — M129 Arthropathy, unspecified: Secondary | ICD-10-CM

## 2016-12-25 DIAGNOSIS — M8580 Other specified disorders of bone density and structure, unspecified site: Secondary | ICD-10-CM

## 2016-12-25 DIAGNOSIS — D696 Thrombocytopenia, unspecified: Secondary | ICD-10-CM | POA: Diagnosis not present

## 2016-12-25 DIAGNOSIS — Z79899 Other long term (current) drug therapy: Secondary | ICD-10-CM

## 2016-12-25 DIAGNOSIS — C8338 Diffuse large B-cell lymphoma, lymph nodes of multiple sites: Secondary | ICD-10-CM

## 2016-12-25 DIAGNOSIS — Z803 Family history of malignant neoplasm of breast: Secondary | ICD-10-CM

## 2016-12-25 DIAGNOSIS — J45909 Unspecified asthma, uncomplicated: Secondary | ICD-10-CM | POA: Insufficient documentation

## 2016-12-25 DIAGNOSIS — D649 Anemia, unspecified: Secondary | ICD-10-CM | POA: Insufficient documentation

## 2016-12-25 DIAGNOSIS — Z5112 Encounter for antineoplastic immunotherapy: Secondary | ICD-10-CM | POA: Insufficient documentation

## 2016-12-25 DIAGNOSIS — C7951 Secondary malignant neoplasm of bone: Secondary | ICD-10-CM | POA: Insufficient documentation

## 2016-12-25 DIAGNOSIS — N281 Cyst of kidney, acquired: Secondary | ICD-10-CM | POA: Insufficient documentation

## 2016-12-25 LAB — COMPREHENSIVE METABOLIC PANEL
ALBUMIN: 3.7 g/dL (ref 3.5–5.0)
ALK PHOS: 52 U/L (ref 38–126)
ALT: 8 U/L — AB (ref 17–63)
AST: 16 U/L (ref 15–41)
Anion gap: 6 (ref 5–15)
BILIRUBIN TOTAL: 0.5 mg/dL (ref 0.3–1.2)
BUN: 23 mg/dL — AB (ref 6–20)
CALCIUM: 9.2 mg/dL (ref 8.9–10.3)
CO2: 30 mmol/L (ref 22–32)
CREATININE: 0.87 mg/dL (ref 0.61–1.24)
Chloride: 102 mmol/L (ref 101–111)
GFR calc Af Amer: >60 mL/min (ref >60)
GFR calc non Af Amer: >60 mL/min (ref >60)
GLUCOSE: 106 mg/dL — AB (ref 65–99)
POTASSIUM: 4.1 mmol/L (ref 3.5–5.1)
Sodium: 138 mmol/L (ref 135–145)
TOTAL PROTEIN: 6.6 g/dL (ref 6.5–8.1)

## 2016-12-25 LAB — CBC WITH DIFFERENTIAL/PLATELET
Basophils Absolute: 0 10*3/uL (ref 0–0.1)
Basophils Relative: 0 %
EOS PCT: 0 %
Eosinophils Absolute: 0 10*3/uL (ref 0–0.7)
HEMATOCRIT: 34.4 % — AB (ref 40.0–52.0)
Hemoglobin: 11.5 g/dL — ABNORMAL LOW (ref 13.0–18.0)
LYMPHS PCT: 10 %
Lymphs Abs: 0.7 10*3/uL — ABNORMAL LOW (ref 1.0–3.6)
MCH: 30.4 pg (ref 26.0–34.0)
MCHC: 33.5 g/dL (ref 32.0–36.0)
MCV: 90.9 fL (ref 80.0–100.0)
MONO ABS: 0.7 10*3/uL (ref 0.2–1.0)
MONOS PCT: 10 %
NEUTROS ABS: 5.6 10*3/uL (ref 1.4–6.5)
Neutrophils Relative %: 80 %
PLATELETS: 222 10*3/uL (ref 150–440)
RBC: 3.78 MIL/uL — ABNORMAL LOW (ref 4.40–5.90)
RDW: 15.8 % — AB (ref 11.5–14.5)
WBC: 7.1 10*3/uL (ref 3.8–10.6)

## 2016-12-25 MED ORDER — SODIUM CHLORIDE 0.9 % IV SOLN
375.0000 mg/m2 | Freq: Once | INTRAVENOUS | Status: AC
  Administered 2016-12-25: 800 mg via INTRAVENOUS
  Filled 2016-12-25: qty 50

## 2016-12-25 MED ORDER — ACETAMINOPHEN 325 MG PO TABS
650.0000 mg | ORAL_TABLET | Freq: Once | ORAL | Status: AC
  Administered 2016-12-25: 650 mg via ORAL
  Filled 2016-12-25: qty 2

## 2016-12-25 MED ORDER — DIPHENHYDRAMINE HCL 25 MG PO CAPS
25.0000 mg | ORAL_CAPSULE | Freq: Once | ORAL | Status: AC
  Administered 2016-12-25: 25 mg via ORAL
  Filled 2016-12-25: qty 1

## 2016-12-25 MED ORDER — SODIUM CHLORIDE 0.9 % IV SOLN
Freq: Once | INTRAVENOUS | Status: AC
  Administered 2016-12-25: 11:00:00 via INTRAVENOUS
  Filled 2016-12-25: qty 1000

## 2016-12-28 NOTE — Progress Notes (Signed)
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  INTERVAL HISTORY: Patient returns to clinic today for further evaluation and initiation of cycle 2 of 4 of weekly Rituxan. He continues to feel well and is asymptomatic. He does not complain of weakness or fatigue. He reports no bleeding or bruising. He has no neurologic complaints. He denies any recent fevers or chills. He has a good appetite and denies weight loss. He denies any chest pain, shortness of breath, cough, or hemoptysis.  He has no nausea, vomiting, constipation, or diarrhea. He has no urinary complaints. Patient offers no specific complaints today.  REVIEW OF SYSTEMS:   Review of Systems  Constitutional: Negative for fever, malaise/fatigue and weight loss.  Respiratory: Negative for cough and shortness of breath.   Cardiovascular: Negative.  Negative for chest pain and leg swelling.  Gastrointestinal: Negative.  Negative for abdominal pain, constipation, diarrhea, nausea and vomiting.  Musculoskeletal: Negative.  Negative for joint pain.  Neurological: Negative.  Negative for tingling, sensory change, weakness and headaches.  Endo/Heme/Allergies: Does not bruise/bleed easily.  Psychiatric/Behavioral: Negative.  The patient is not nervous/anxious.     As per HPI. Otherwise, a complete review of systems is negative.  PAST MEDICAL HISTORY: Past Medical History:  Diagnosis Date  . Arthritis   . Asthma   . Cancer [facility])    prostate cancer-treated medically  . COPD (chronic obstructive pulmonary disease) ([place])   . Foot drop, left   . Hernia, inguinal, left   . HOH (hard of hearing)   . Tremors of nervous system       PAST SURGICAL HISTORY: Past Surgical History:  Procedure Laterality Date  . ANKLE FRACTURE SURGERY  2009   lt  . ANKLE FRACTURE SURGERY     rt  . EYE SURGERY     both cataracts  . HEMORRHOID SURGERY    . JOINT REPLACEMENT  2000   rt hip replacement  . WRIST FRACTURE SURGERY Bilateral     FAMILY HISTORY: Family History  Problem Relation Age of Onset  . Heart attack Father   . Tremor Father   . Cancer Sister   . Stomach cancer Brother   . Heart attack Sister   . Heart attack Sister     ADVANCED DIRECTIVES (Y/N):  N  HEALTH MAINTENANCE: Social History  Substance Use Topics  . Smoking status: Never Smoker  . Smokeless tobacco: Never Used  . Alcohol use No     Colonoscopy:  PAP:  Bone density:  Lipid panel:  No Known Allergies  Current Outpatient Prescriptions  Medication Sig Dispense Refill  . Fluticasone-Salmeterol (ADVAIR DISKUS) 100-50 MCG/DOSE AEPB Inhale 1 puff into the lungs 2 (two) times daily. 60 each 12  . predniSONE (DELTASONE) 10 MG tablet Take 3 tablets daily for 2 weeks then take 2 tablets daily 75 tablet 0  . propranolol (INDERAL) 20 MG tablet TAKE ONE TABLET BY MOUTH ONCE DAILY 90 tablet 3  . ranitidine (ZANTAC) 150 MG tablet Take 1 tablet (150 mg total) by mouth 2 (two) times daily. 60 tablet 11  . acetaminophen (TYLENOL) 500 MG tablet Take 500 mg by mouth every 6 (six) hours as needed.    . pantoprazole (PROTONIX) 40 MG  tablet Take by mouth.     No current facility-administered medications for this visit.     OBJECTIVE: Vitals:   12/31/16 1139  BP: (!) 154/76  Pulse: 64  Resp: 18  Temp: (!) 96.7 F (35.9 C)     Body mass index is 24.81 kg/m.    ECOG FS:1 - Symptomatic but completely ambulatory  General: Well-developed, well-nourished, no acute distress. Eyes: Pink conjunctiva, anicteric sclera. HEENT: Normocephalic, moist mucous membranes, clear oropharnyx. Lungs: Clear to auscultation bilaterally. Heart: Regular rate and  rhythm. No rubs, murmurs, or gallops. Abdomen: Soft, nontender, nondistended. No organomegaly noted, normoactive bowel sounds. Musculoskeletal: No edema, cyanosis, or clubbing. Neuro: Alert, answering all questions appropriately. Cranial nerves grossly intact. Skin: No rashes or petechiae noted. Psych: Normal affect. Lymphatics: Easily palpable left axillary lympadenopathy.  Unchanged.  LAB RESULTS:  Lab Results  Component Value Date   NA 137 12/31/2016   K 4.4 12/31/2016   CL 102 12/31/2016   CO2 27 12/31/2016   GLUCOSE 85 12/31/2016   BUN 26 (H) 12/31/2016   CREATININE 0.88 12/31/2016   CALCIUM 9.1 12/31/2016   PROT 6.8 12/31/2016   ALBUMIN 3.7 12/31/2016   AST 18 12/31/2016   ALT 10 (L) 12/31/2016   ALKPHOS 53 12/31/2016   BILITOT 0.6 12/31/2016   GFRNONAA >60 12/31/2016   GFRAA >60 12/31/2016    Lab Results  Component Value Date   WBC 7.9 12/31/2016   NEUTROABS 6.3 12/31/2016   HGB 11.9 (L) 12/31/2016   HCT 36.7 (L) 12/31/2016   MCV 92.3 12/31/2016   PLT 180 12/31/2016     STUDIES: Dg Cervical Spine Complete  Result Date: 12/12/2016 CLINICAL DATA:  Right-sided neck pain for several months without known injury. EXAM: CERVICAL SPINE - COMPLETE 4+ VIEW COMPARISON:  Radiographs of November 10, 2013. FINDINGS: Stable grade 1 anterolisthesis of C4-5 is noted secondary to posterior facet joint hypertrophy. Moderate degenerative disc disease is noted at C5-6. No fracture is noted. No prevertebral soft tissue swelling is noted. Mild diffuse osteopenia is noted. Mild degenerative disc disease is noted at C3-4 and C6-7. Mild bilateral neural foraminal stenosis is noted at C5-6 and C6-7 secondary to uncovertebral spurring. IMPRESSION: Multilevel degenerative disc disease is noted as described above. No acute abnormality seen in the cervical spine. Electronically Signed   By: Marijo Conception, M.D.   On: 12/12/2016 09:33   Dg Lumbar Spine Complete  Result Date: 12/12/2016 CLINICAL  DATA:  Low back pain without known injury. EXAM: LUMBAR SPINE - COMPLETE 4+ VIEW COMPARISON:  Radiographs of July 17, 2014. FINDINGS: Stable old compression deformities of L2 and L3 vertebral bodies are noted. No acute fracture or spondylolisthesis is noted. Moderate degenerative disc disease is noted at L4-5. Atherosclerosis of thoracic aorta is noted. Diffuse osteopenia is noted. Degenerative changes are seen involving posterior facet joints of L5-S1. IMPRESSION: Stable old compression fractures of L2 and L3. Degenerative changes as described above. Aortic atherosclerosis. No acute abnormality seen in the lumbar spine. Electronically Signed   By: Marijo Conception, M.D.   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  PLAN:    1. 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 Patient's most recent PSA is trending up from 32.88 to 40.48. He does not wish any further treatment at this time. 2. At least  stage III diffuse large B-cell lymphoma: PET scan results from November 28, 2016 reviewed independently with clear progression of disease. Patient last received single agent Rituxan along with prednisone on May 27, 2016. Patient declined further aggressive chemotherapy given his advanced age. Proceed with cycle 2 of 4 of weekly Rituxan today. Continue prednisone 30 mg daily. Return to clinic in 1 week for further evaluation and consideration of cycle 3. 3. Anemia: Patient's hemoglobin is slightly decreased, monitor.  4. Thrombocytopenia: Resolved. 5. Bony metastasis: Nuclear med bone scan results reviewed independently. Patient has completed palliative XRT to the lesion in his right leg. PET scan results as above.  Approximately 30 minutes was spent in discussion of which greater than 50% was consultation.  Patient expressed  understanding and was in agreement with this plan. He also understands that He can call clinic at any time with any questions, concerns, or complaints.   Cancer Staging DLBCL (diffuse large B cell lymphoma) (HCC) Staging form: Hodgkin and Non-Hodgkin Lymphoma, AJCC 8th Edition - Clinical stage from 04/28/2016: Stage III (Diffuse large B-cell lymphoma) - Signed by Lloyd Huger, MD on 04/28/2016  Prostate cancer metastatic to bone [facility]) Staging form: Prostate, AJCC 7th Edition - Clinical stage from 09/24/2014: Stage IV (TX, NX, M1b, PSA: 20 or greater, Gleason X - Gleason score cannot be processed) - Signed by Lloyd Huger, MD on 09/24/2014  Lloyd Huger, MD 01/03/17 8:03 AM

## 2016-12-30 ENCOUNTER — Other Ambulatory Visit: Payer: Self-pay | Admitting: Oncology

## 2016-12-31 ENCOUNTER — Inpatient Hospital Stay: Payer: Medicare Other

## 2016-12-31 ENCOUNTER — Inpatient Hospital Stay (HOSPITAL_BASED_OUTPATIENT_CLINIC_OR_DEPARTMENT_OTHER): Payer: Medicare Other | Admitting: Oncology

## 2016-12-31 VITALS — BP 154/76 | HR 64 | Temp 96.7°F | Resp 18 | Wt 172.9 lb

## 2016-12-31 DIAGNOSIS — Z803 Family history of malignant neoplasm of breast: Secondary | ICD-10-CM

## 2016-12-31 DIAGNOSIS — J45909 Unspecified asthma, uncomplicated: Secondary | ICD-10-CM

## 2016-12-31 DIAGNOSIS — M542 Cervicalgia: Secondary | ICD-10-CM

## 2016-12-31 DIAGNOSIS — J449 Chronic obstructive pulmonary disease, unspecified: Secondary | ICD-10-CM

## 2016-12-31 DIAGNOSIS — Z79899 Other long term (current) drug therapy: Secondary | ICD-10-CM | POA: Diagnosis not present

## 2016-12-31 DIAGNOSIS — C61 Malignant neoplasm of prostate: Secondary | ICD-10-CM

## 2016-12-31 DIAGNOSIS — R590 Localized enlarged lymph nodes: Secondary | ICD-10-CM

## 2016-12-31 DIAGNOSIS — M129 Arthropathy, unspecified: Secondary | ICD-10-CM

## 2016-12-31 DIAGNOSIS — C7951 Secondary malignant neoplasm of bone: Secondary | ICD-10-CM

## 2016-12-31 DIAGNOSIS — D649 Anemia, unspecified: Secondary | ICD-10-CM

## 2016-12-31 DIAGNOSIS — D696 Thrombocytopenia, unspecified: Secondary | ICD-10-CM | POA: Diagnosis not present

## 2016-12-31 DIAGNOSIS — M8580 Other specified disorders of bone density and structure, unspecified site: Secondary | ICD-10-CM | POA: Diagnosis not present

## 2016-12-31 DIAGNOSIS — K4090 Unilateral inguinal hernia, without obstruction or gangrene, not specified as recurrent: Secondary | ICD-10-CM | POA: Diagnosis not present

## 2016-12-31 DIAGNOSIS — N281 Cyst of kidney, acquired: Secondary | ICD-10-CM | POA: Diagnosis not present

## 2016-12-31 DIAGNOSIS — C8338 Diffuse large B-cell lymphoma, lymph nodes of multiple sites: Secondary | ICD-10-CM

## 2016-12-31 DIAGNOSIS — Z5112 Encounter for antineoplastic immunotherapy: Secondary | ICD-10-CM | POA: Diagnosis not present

## 2016-12-31 DIAGNOSIS — C8330 Diffuse large B-cell lymphoma, unspecified site: Secondary | ICD-10-CM | POA: Diagnosis not present

## 2016-12-31 DIAGNOSIS — R251 Tremor, unspecified: Secondary | ICD-10-CM | POA: Diagnosis not present

## 2016-12-31 DIAGNOSIS — I700 Atherosclerosis of aorta: Secondary | ICD-10-CM

## 2016-12-31 DIAGNOSIS — M5136 Other intervertebral disc degeneration, lumbar region: Secondary | ICD-10-CM

## 2016-12-31 LAB — COMPREHENSIVE METABOLIC PANEL
ALK PHOS: 53 U/L (ref 38–126)
ALT: 10 U/L — AB (ref 17–63)
AST: 18 U/L (ref 15–41)
Albumin: 3.7 g/dL (ref 3.5–5.0)
Anion gap: 8 (ref 5–15)
BUN: 26 mg/dL — ABNORMAL HIGH (ref 6–20)
CALCIUM: 9.1 mg/dL (ref 8.9–10.3)
CO2: 27 mmol/L (ref 22–32)
CREATININE: 0.88 mg/dL (ref 0.61–1.24)
Chloride: 102 mmol/L (ref 101–111)
GFR calc non Af Amer: >60 mL/min (ref >60)
Glucose, Bld: 85 mg/dL (ref 65–99)
Potassium: 4.4 mmol/L (ref 3.5–5.1)
SODIUM: 137 mmol/L (ref 135–145)
Total Bilirubin: 0.6 mg/dL (ref 0.3–1.2)
Total Protein: 6.8 g/dL (ref 6.5–8.1)

## 2016-12-31 LAB — CBC WITH DIFFERENTIAL/PLATELET
BASOS PCT: 0 %
Basophils Absolute: 0 10*3/uL (ref 0–0.1)
EOS ABS: 0 10*3/uL (ref 0–0.7)
EOS PCT: 0 %
HCT: 36.7 % — ABNORMAL LOW (ref 40.0–52.0)
HEMOGLOBIN: 11.9 g/dL — AB (ref 13.0–18.0)
Lymphocytes Relative: 9 %
Lymphs Abs: 0.7 10*3/uL — ABNORMAL LOW (ref 1.0–3.6)
MCH: 30 pg (ref 26.0–34.0)
MCHC: 32.5 g/dL (ref 32.0–36.0)
MCV: 92.3 fL (ref 80.0–100.0)
MONO ABS: 0.9 10*3/uL (ref 0.2–1.0)
MONOS PCT: 11 %
NEUTROS PCT: 80 %
Neutro Abs: 6.3 10*3/uL (ref 1.4–6.5)
PLATELETS: 180 10*3/uL (ref 150–440)
RBC: 3.97 MIL/uL — ABNORMAL LOW (ref 4.40–5.90)
RDW: 16.4 % — AB (ref 11.5–14.5)
WBC: 7.9 10*3/uL (ref 3.8–10.6)

## 2016-12-31 MED ORDER — SODIUM CHLORIDE 0.9 % IV SOLN
Freq: Once | INTRAVENOUS | Status: AC
  Administered 2016-12-31: 12:00:00 via INTRAVENOUS
  Filled 2016-12-31: qty 1000

## 2016-12-31 MED ORDER — SODIUM CHLORIDE 0.9 % IV SOLN
800.0000 mg | Freq: Once | INTRAVENOUS | Status: AC
  Administered 2016-12-31: 800 mg via INTRAVENOUS
  Filled 2016-12-31: qty 50

## 2016-12-31 MED ORDER — SODIUM CHLORIDE 0.9 % IV SOLN: 375.0000 mg/m2 | Freq: Once | INTRAVENOUS | Status: DC

## 2016-12-31 MED ORDER — DIPHENHYDRAMINE HCL 25 MG PO CAPS
25.0000 mg | ORAL_CAPSULE | Freq: Once | ORAL | Status: AC
  Administered 2016-12-31: 25 mg via ORAL
  Filled 2016-12-31: qty 1

## 2016-12-31 MED ORDER — ACETAMINOPHEN 325 MG PO TABS
650.0000 mg | ORAL_TABLET | Freq: Once | ORAL | Status: AC
  Administered 2016-12-31: 650 mg via ORAL
  Filled 2016-12-31: qty 2

## 2016-12-31 NOTE — Progress Notes (Signed)
Here for follow up

## 2017-01-06 NOTE — Progress Notes (Signed)
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  INTERVAL HISTORY: Patient returns to clinic today for further evaluation and consideration of cycle 3 of 4 of weekly Rituxan. He continues to feel well and is asymptomatic. He does not complain of weakness or fatigue. He reports no bleeding or bruising. He has no neurologic complaints. He denies any recent fevers or chills. He has a good appetite, but continues to lose weight slowly. He denies any chest pain, shortness of breath, cough, or hemoptysis.  He has no nausea, vomiting, constipation, or diarrhea. He has no urinary complaints. Patient offers no specific complaints today.  REVIEW OF SYSTEMS:   Review of Systems  Constitutional: Positive for weight loss. Negative for fever and malaise/fatigue.  Respiratory: Negative for cough and shortness of breath.   Cardiovascular: Negative.  Negative for chest pain and leg swelling.  Gastrointestinal: Negative.  Negative for abdominal pain, constipation, diarrhea, nausea and vomiting.  Genitourinary: Negative.   Musculoskeletal: Negative.  Negative for joint pain.  Skin: Negative.  Negative for rash.  Neurological: Negative.  Negative for tingling, sensory change, weakness and headaches.  Endo/Heme/Allergies: Does not bruise/bleed easily.  Psychiatric/Behavioral: Negative.  The patient is not nervous/anxious.     As per HPI. Otherwise, a complete review of systems is negative.  PAST MEDICAL HISTORY: Past Medical History:  Diagnosis Date  . Arthritis   . Asthma   . Cancer [facility])    prostate cancer-treated medically  . COPD (chronic obstructive pulmonary disease) ([place])   . Foot  drop, left   . Hernia, inguinal, left   . HOH (hard of hearing)   . Tremors of nervous system     PAST SURGICAL HISTORY: Past Surgical History:  Procedure Laterality Date  . ANKLE FRACTURE SURGERY  2009   lt  . ANKLE FRACTURE SURGERY     rt  . EYE SURGERY     both cataracts  . HEMORRHOID SURGERY    . JOINT REPLACEMENT  2000   rt hip replacement  . WRIST FRACTURE SURGERY Bilateral     FAMILY HISTORY: Family History  Problem Relation Age of Onset  . Heart attack Father   . Tremor Father   . Cancer Sister   . Stomach cancer Brother   . Heart attack Sister   . Heart attack Sister     ADVANCED DIRECTIVES (Y/N):  N  HEALTH MAINTENANCE: Social History  Substance Use Topics  . Smoking status: Never Smoker  . Smokeless tobacco: Never Used  . Alcohol use No     Colonoscopy:  PAP:  Bone density:  Lipid panel:  No Known Allergies  Current Outpatient Prescriptions  Medication Sig Dispense Refill  . acetaminophen (TYLENOL) 500 MG tablet Take 500 mg by mouth every 6 (six) hours as needed.    . Fluticasone-Salmeterol (ADVAIR DISKUS) 100-50 MCG/DOSE AEPB Inhale 1 puff into the lungs 2 (two) times daily. 60 each 12  . pantoprazole (PROTONIX) 40 MG tablet Take by mouth.    . propranolol (INDERAL) 20 MG tablet TAKE ONE TABLET BY MOUTH ONCE DAILY 90 tablet 3  . ranitidine (ZANTAC) 150 MG tablet Take 1 tablet (150 mg total) by mouth 2 (two) times daily. 60 tablet 11  .  predniSONE (DELTASONE) 10 MG tablet Take 3 tablets (30 mg total) by mouth daily with breakfast. 60 tablet 1   No current facility-administered medications for this visit.     OBJECTIVE: Vitals:   01/08/17 1115  BP: (!) 157/66  Pulse: 66  Resp: 20  Temp: 97.6 F (36.4 C)     Body mass index is 24.61 kg/m.    ECOG FS:1 - Symptomatic but completely ambulatory  General: Well-developed, well-nourished, no acute distress. Eyes: Pink conjunctiva, anicteric sclera. HEENT: Normocephalic, moist mucous  membranes, clear oropharnyx. Lungs: Clear to auscultation bilaterally. Heart: Regular rate and rhythm. No rubs, murmurs, or gallops. Abdomen: Soft, nontender, nondistended. No organomegaly noted, normoactive bowel sounds. Musculoskeletal: No edema, cyanosis, or clubbing. Neuro: Alert, answering all questions appropriately. Cranial nerves grossly intact. Skin: No rashes or petechiae noted. Psych: Normal affect. Lymphatics: Easily palpable left axillary lympadenopathy.  Unchanged.  LAB RESULTS:  Lab Results  Component Value Date   NA 137 12/31/2016   K 4.4 12/31/2016   CL 102 12/31/2016   CO2 27 12/31/2016   GLUCOSE 85 12/31/2016   BUN 26 (H) 12/31/2016   CREATININE 0.88 12/31/2016   CALCIUM 9.1 12/31/2016   PROT 6.8 12/31/2016   ALBUMIN 3.7 12/31/2016   AST 18 12/31/2016   ALT 10 (L) 12/31/2016   ALKPHOS 53 12/31/2016   BILITOT 0.6 12/31/2016   GFRNONAA >60 12/31/2016   GFRAA >60 12/31/2016    Lab Results  Component Value Date   WBC 12.3 (H) 01/08/2017   NEUTROABS 10.4 (H) 01/08/2017   HGB 12.4 (L) 01/08/2017   HCT 38.2 (L) 01/08/2017   MCV 92.7 01/08/2017   PLT 126 (L) 01/08/2017     STUDIES: Dg Cervical Spine Complete  Result Date: 12/12/2016 CLINICAL DATA:  Right-sided neck pain for several months without known injury. EXAM: CERVICAL SPINE - COMPLETE 4+ VIEW COMPARISON:  Radiographs of November 10, 2013. FINDINGS: Stable grade 1 anterolisthesis of C4-5 is noted secondary to posterior facet joint hypertrophy. Moderate degenerative disc disease is noted at C5-6. No fracture is noted. No prevertebral soft tissue swelling is noted. Mild diffuse osteopenia is noted. Mild degenerative disc disease is noted at C3-4 and C6-7. Mild bilateral neural foraminal stenosis is noted at C5-6 and C6-7 secondary to uncovertebral spurring. IMPRESSION: Multilevel degenerative disc disease is noted as described above. No acute abnormality seen in the cervical spine. Electronically Signed    By: Marijo Conception, M.D.   On: 12/12/2016 09:33   Dg Lumbar Spine Complete  Result Date: 12/12/2016 CLINICAL DATA:  Low back pain without known injury. EXAM: LUMBAR SPINE - COMPLETE 4+ VIEW COMPARISON:  Radiographs of July 17, 2014. FINDINGS: Stable old compression deformities of L2 and L3 vertebral bodies are noted. No acute fracture or spondylolisthesis is noted. Moderate degenerative disc disease is noted at L4-5. Atherosclerosis of thoracic aorta is noted. Diffuse osteopenia is noted. Degenerative changes are seen involving posterior facet joints of L5-S1. IMPRESSION: Stable old compression fractures of L2 and L3. Degenerative changes as described above. Aortic atherosclerosis. No acute abnormality seen in the lumbar spine. Electronically Signed   By: Marijo Conception, M.D.   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  PLAN:    1. 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 Patient's most recent PSA is trending up from 32.88  to 40.48. He does not wish any further treatment at this time. 2. At least stage III diffuse large B-cell lymphoma: PET scan results from November 28, 2016 reviewed independently with clear progression of disease. Patient last received single agent Rituxan along with prednisone on May 27, 2016. Patient declined further aggressive chemotherapy given his advanced age. Proceed with cycle 3 of 4 of weekly Rituxan today. Continue prednisone 30 mg daily. Return to clinic in 1 week for further evaluation and consideration of cycle 4. 3. Anemia: Patient's hemoglobin is slightly decreased, monitor.  4. Thrombocytopenia: Mild, monitor. 5. Bony metastasis: Nuclear med bone scan results reviewed independently. Patient has completed palliative XRT to the lesion in his right leg. PET scan results as  above.  Approximately 30 minutes was spent in discussion of which greater than 50% was consultation.  Patient expressed understanding and was in agreement with this plan. He also understands that He can call clinic at any time with any questions, concerns, or complaints.   Cancer Staging DLBCL (diffuse large B cell lymphoma) (HCC) Staging form: Hodgkin and Non-Hodgkin Lymphoma, AJCC 8th Edition - Clinical stage from 04/28/2016: Stage III (Diffuse large B-cell lymphoma) - Signed by Lloyd Huger, MD on 04/28/2016  Prostate cancer metastatic to bone [facility]) Staging form: Prostate, AJCC 7th Edition - Clinical stage from 09/24/2014: Stage IV (TX, NX, M1b, PSA: 20 or greater, Gleason X - Gleason score cannot be processed) - Signed by Lloyd Huger, MD on 09/24/2014  Lloyd Huger, MD 01/08/17 9:27 PM

## 2017-01-08 ENCOUNTER — Inpatient Hospital Stay (HOSPITAL_BASED_OUTPATIENT_CLINIC_OR_DEPARTMENT_OTHER): Payer: Medicare Other | Admitting: Oncology

## 2017-01-08 ENCOUNTER — Inpatient Hospital Stay: Payer: Medicare Other

## 2017-01-08 VITALS — BP 157/66 | HR 66 | Temp 97.6°F | Resp 20 | Wt 171.5 lb

## 2017-01-08 DIAGNOSIS — M5136 Other intervertebral disc degeneration, lumbar region: Secondary | ICD-10-CM | POA: Diagnosis not present

## 2017-01-08 DIAGNOSIS — C61 Malignant neoplasm of prostate: Secondary | ICD-10-CM | POA: Diagnosis not present

## 2017-01-08 DIAGNOSIS — N281 Cyst of kidney, acquired: Secondary | ICD-10-CM | POA: Diagnosis not present

## 2017-01-08 DIAGNOSIS — I700 Atherosclerosis of aorta: Secondary | ICD-10-CM | POA: Diagnosis not present

## 2017-01-08 DIAGNOSIS — J449 Chronic obstructive pulmonary disease, unspecified: Secondary | ICD-10-CM | POA: Diagnosis not present

## 2017-01-08 DIAGNOSIS — C7951 Secondary malignant neoplasm of bone: Secondary | ICD-10-CM

## 2017-01-08 DIAGNOSIS — R590 Localized enlarged lymph nodes: Secondary | ICD-10-CM

## 2017-01-08 DIAGNOSIS — Z5112 Encounter for antineoplastic immunotherapy: Secondary | ICD-10-CM | POA: Diagnosis not present

## 2017-01-08 DIAGNOSIS — M129 Arthropathy, unspecified: Secondary | ICD-10-CM | POA: Diagnosis not present

## 2017-01-08 DIAGNOSIS — Z79899 Other long term (current) drug therapy: Secondary | ICD-10-CM

## 2017-01-08 DIAGNOSIS — Z803 Family history of malignant neoplasm of breast: Secondary | ICD-10-CM

## 2017-01-08 DIAGNOSIS — M8580 Other specified disorders of bone density and structure, unspecified site: Secondary | ICD-10-CM

## 2017-01-08 DIAGNOSIS — C8338 Diffuse large B-cell lymphoma, lymph nodes of multiple sites: Secondary | ICD-10-CM

## 2017-01-08 DIAGNOSIS — J45909 Unspecified asthma, uncomplicated: Secondary | ICD-10-CM | POA: Diagnosis not present

## 2017-01-08 DIAGNOSIS — C8330 Diffuse large B-cell lymphoma, unspecified site: Secondary | ICD-10-CM | POA: Diagnosis not present

## 2017-01-08 DIAGNOSIS — R251 Tremor, unspecified: Secondary | ICD-10-CM

## 2017-01-08 DIAGNOSIS — M542 Cervicalgia: Secondary | ICD-10-CM | POA: Diagnosis not present

## 2017-01-08 DIAGNOSIS — K4090 Unilateral inguinal hernia, without obstruction or gangrene, not specified as recurrent: Secondary | ICD-10-CM | POA: Diagnosis not present

## 2017-01-08 DIAGNOSIS — D696 Thrombocytopenia, unspecified: Secondary | ICD-10-CM | POA: Diagnosis not present

## 2017-01-08 DIAGNOSIS — D649 Anemia, unspecified: Secondary | ICD-10-CM

## 2017-01-08 LAB — CBC WITH DIFFERENTIAL/PLATELET
BASOS ABS: 0.1 10*3/uL (ref 0–0.1)
BASOS PCT: 1 %
EOS PCT: 0 %
Eosinophils Absolute: 0 10*3/uL (ref 0–0.7)
HCT: 38.2 % — ABNORMAL LOW (ref 40.0–52.0)
Hemoglobin: 12.4 g/dL — ABNORMAL LOW (ref 13.0–18.0)
LYMPHS PCT: 5 %
Lymphs Abs: 0.6 10*3/uL — ABNORMAL LOW (ref 1.0–3.6)
MCH: 30 pg (ref 26.0–34.0)
MCHC: 32.3 g/dL (ref 32.0–36.0)
MCV: 92.7 fL (ref 80.0–100.0)
Monocytes Absolute: 1.1 10*3/uL — ABNORMAL HIGH (ref 0.2–1.0)
Monocytes Relative: 9 %
Neutro Abs: 10.4 10*3/uL — ABNORMAL HIGH (ref 1.4–6.5)
Neutrophils Relative %: 85 %
PLATELETS: 126 10*3/uL — AB (ref 150–440)
RBC: 4.12 MIL/uL — AB (ref 4.40–5.90)
RDW: 17 % — AB (ref 11.5–14.5)
WBC: 12.3 10*3/uL — AB (ref 3.8–10.6)

## 2017-01-08 MED ORDER — RITUXIMAB CHEMO INJECTION 500 MG/50ML
800.0000 mg | Freq: Once | INTRAVENOUS | Status: AC
  Administered 2017-01-08: 800 mg via INTRAVENOUS
  Filled 2017-01-08: qty 50

## 2017-01-08 MED ORDER — SODIUM CHLORIDE 0.9 % IV SOLN: 375.0000 mg/m2 | Freq: Once | INTRAVENOUS | Status: DC

## 2017-01-08 MED ORDER — PREDNISONE 10 MG PO TABS: 30.0000 mg | ORAL_TABLET | Freq: Every day | ORAL | 1 refills | Status: DC

## 2017-01-08 MED ORDER — ACETAMINOPHEN 325 MG PO TABS
650.0000 mg | ORAL_TABLET | Freq: Once | ORAL | Status: AC
  Administered 2017-01-08: 650 mg via ORAL
  Filled 2017-01-08: qty 2

## 2017-01-08 MED ORDER — DIPHENHYDRAMINE HCL 25 MG PO CAPS
25.0000 mg | ORAL_CAPSULE | Freq: Once | ORAL | Status: AC
  Administered 2017-01-08: 25 mg via ORAL
  Filled 2017-01-08: qty 1

## 2017-01-08 MED ORDER — SODIUM CHLORIDE 0.9 % IV SOLN
Freq: Once | INTRAVENOUS | Status: AC
  Administered 2017-01-08: 12:00:00 via INTRAVENOUS
  Filled 2017-01-08: qty 1000

## 2017-01-08 NOTE — Progress Notes (Signed)
Pt in today for follow up and chemo treatment. States doing well, denies any difficulties, voiced no concerns.

## 2017-01-14 NOTE — Progress Notes (Signed)
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  INTERVAL HISTORY: Patient returns to clinic today for further evaluation and consideration of cycle 4 of 4 of weekly Rituxan. He continues to feel well and is asymptomatic. He does not complain of weakness or fatigue. He reports no bleeding or bruising. He has no neurologic complaints. He denies any recent fevers or chills. He has a good appetite, but continues to lose weight slowly. He denies any chest pain, shortness of breath, cough, or hemoptysis.  He has no nausea, vomiting, constipation, or diarrhea. He has no urinary complaints. Patient offers no specific complaints today.  REVIEW OF SYSTEMS:   Review of Systems  Constitutional: Positive for weight loss. Negative for fever and malaise/fatigue.  Respiratory: Negative for cough and shortness of breath.   Cardiovascular: Negative.  Negative for chest pain and leg swelling.  Gastrointestinal: Negative.  Negative for abdominal pain, constipation, diarrhea, nausea and vomiting.  Genitourinary: Negative.   Musculoskeletal: Negative.  Negative for joint pain.  Skin: Negative.  Negative for rash.  Neurological: Negative.  Negative for tingling, sensory change, weakness and headaches.  Endo/Heme/Allergies: Does not bruise/bleed easily.  Psychiatric/Behavioral: Negative.  The patient is not nervous/anxious.     As per HPI. Otherwise, a complete review of systems is negative.  PAST MEDICAL HISTORY: Past Medical History:  Diagnosis Date  . Arthritis   . Asthma   . Cancer [facility])    prostate cancer-treated medically  . COPD (chronic obstructive pulmonary disease) ([place])   . Foot  drop, left   . Hernia, inguinal, left   . HOH (hard of hearing)   . Tremors of nervous system     PAST SURGICAL HISTORY: Past Surgical History:  Procedure Laterality Date  . ANKLE FRACTURE SURGERY  2009   lt  . ANKLE FRACTURE SURGERY     rt  . EYE SURGERY     both cataracts  . HEMORRHOID SURGERY    . JOINT REPLACEMENT  2000   rt hip replacement  . WRIST FRACTURE SURGERY Bilateral     FAMILY HISTORY: Family History  Problem Relation Age of Onset  . Heart attack Father   . Tremor Father   . Cancer Sister   . Stomach cancer Brother   . Heart attack Sister   . Heart attack Sister     ADVANCED DIRECTIVES (Y/N):  N  HEALTH MAINTENANCE: Social History  Substance Use Topics  . Smoking status: Never Smoker  . Smokeless tobacco: Never Used  . Alcohol use No     Colonoscopy:  PAP:  Bone density:  Lipid panel:  No Known Allergies  Current Outpatient Prescriptions  Medication Sig Dispense Refill  . acetaminophen (TYLENOL) 500 MG tablet Take 500 mg by mouth every 6 (six) hours as needed.    . Fluticasone-Salmeterol (ADVAIR DISKUS) 100-50 MCG/DOSE AEPB Inhale 1 puff into the lungs 2 (two) times daily. 60 each 12  . pantoprazole (PROTONIX) 40 MG tablet Take by mouth.    . predniSONE (DELTASONE) 10 MG tablet Take 3 tablets (30 mg total) by mouth daily with breakfast. 60 tablet 1  . propranolol (INDERAL) 20 MG tablet TAKE ONE TABLET BY MOUTH ONCE DAILY 90 tablet 3  . ranitidine (  ZANTAC) 150 MG tablet Take 1 tablet (150 mg total) by mouth 2 (two) times daily. 60 tablet 11   No current facility-administered medications for this visit.     OBJECTIVE: Vitals:   01/15/17 0933  BP: (!) 156/77  Pulse: 71  Resp: 20  Temp: (!) 96.6 F (35.9 C)     Body mass index is 25.4 kg/m.    ECOG FS:1 - Symptomatic but completely ambulatory  General: Well-developed, well-nourished, no acute distress. Eyes: Pink conjunctiva, anicteric sclera. HEENT: Normocephalic, moist mucous  membranes, clear oropharnyx. Lungs: Clear to auscultation bilaterally. Heart: Regular rate and rhythm. No rubs, murmurs, or gallops. Abdomen: Soft, nontender, nondistended. No organomegaly noted, normoactive bowel sounds. Musculoskeletal: No edema, cyanosis, or clubbing. Neuro: Alert, answering all questions appropriately. Cranial nerves grossly intact. Skin: No rashes or petechiae noted. Psych: Normal affect. Lymphatics: Easily palpable left axillary lympadenopathy.  Unchanged.  LAB RESULTS:  Lab Results  Component Value Date   NA 139 01/15/2017   K 4.2 01/15/2017   CL 104 01/15/2017   CO2 27 01/15/2017   GLUCOSE 69 01/15/2017   BUN 27 (H) 01/15/2017   CREATININE 1.06 01/15/2017   CALCIUM 9.2 01/15/2017   PROT 6.5 01/15/2017   ALBUMIN 3.8 01/15/2017   AST 23 01/15/2017   ALT 13 (L) 01/15/2017   ALKPHOS 50 01/15/2017   BILITOT 0.7 01/15/2017   GFRNONAA 58 (L) 01/15/2017   GFRAA >60 01/15/2017    Lab Results  Component Value Date   WBC 6.9 01/15/2017   NEUTROABS 5.3 01/15/2017   HGB 12.7 (L) 01/15/2017   HCT 38.6 (L) 01/15/2017   MCV 93.1 01/15/2017   PLT 141 (L) 01/15/2017     STUDIES: No results found.  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  PLAN:    1. 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 Patient's most recent PSA is trending up from 32.88 to 40.48. He does not wish any further treatment at this time. 2. At least stage III diffuse large B-cell lymphoma: PET scan results from November 28, 2016 reviewed independently with clear progression of disease. Patient last received single agent Rituxan along with prednisone on May 27, 2016. Patient declined further aggressive chemotherapy given his advanced age. Proceed with cycle 4 of 4 of weekly Rituxan today. Continue prednisone 30 mg daily. Return to  clinic in 6 weeks with repeat imaging and further evaluation. 3. Anemia: Patient's hemoglobin is slightly decreased, monitor.  4. Thrombocytopenia: Mild, monitor. 5. Bony metastasis: Nuclear med bone scan results reviewed independently. Patient has completed palliative XRT to the lesion in his right leg. PET scan results as above. 6. Prednisone taper: Patient reports he has an appointment with his primary care physician later this month. Okay to begin tapering at that time per his PCP.  If not, we will begin prednisone taper at next clinic visit in 6 weeks.  Approximately 30 minutes was spent in discussion of which greater than 50% was consultation.  Patient expressed understanding and was in agreement with this plan. He also understands that He can call clinic at any time with any questions, concerns, or complaints.   Cancer Staging DLBCL (diffuse large B cell lymphoma) (HCC) Staging form: Hodgkin and Non-Hodgkin Lymphoma, AJCC 8th Edition - Clinical stage from 04/28/2016: Stage III (Diffuse large B-cell lymphoma) - Signed by Lloyd Huger, MD on 04/28/2016  Prostate cancer metastatic to bone [facility]) Staging form: Prostate, AJCC 7th Edition - Clinical stage from 09/24/2014: Stage IV (TX, NX, M1b, PSA: 20 or greater, Gleason X - Gleason score cannot be processed) - Signed by Lloyd Huger, MD on 09/24/2014  Lloyd Huger, MD 01/16/17 9:37 AM

## 2017-01-15 ENCOUNTER — Inpatient Hospital Stay: Payer: Medicare Other

## 2017-01-15 ENCOUNTER — Inpatient Hospital Stay (HOSPITAL_BASED_OUTPATIENT_CLINIC_OR_DEPARTMENT_OTHER): Payer: Medicare Other | Admitting: Oncology

## 2017-01-15 VITALS — BP 156/77 | HR 71 | Temp 96.6°F | Resp 20 | Wt 177.0 lb

## 2017-01-15 DIAGNOSIS — J45909 Unspecified asthma, uncomplicated: Secondary | ICD-10-CM | POA: Diagnosis not present

## 2017-01-15 DIAGNOSIS — C8338 Diffuse large B-cell lymphoma, lymph nodes of multiple sites: Secondary | ICD-10-CM

## 2017-01-15 DIAGNOSIS — C7951 Secondary malignant neoplasm of bone: Secondary | ICD-10-CM | POA: Diagnosis not present

## 2017-01-15 DIAGNOSIS — N281 Cyst of kidney, acquired: Secondary | ICD-10-CM | POA: Diagnosis not present

## 2017-01-15 DIAGNOSIS — M542 Cervicalgia: Secondary | ICD-10-CM | POA: Diagnosis not present

## 2017-01-15 DIAGNOSIS — M129 Arthropathy, unspecified: Secondary | ICD-10-CM | POA: Diagnosis not present

## 2017-01-15 DIAGNOSIS — M8580 Other specified disorders of bone density and structure, unspecified site: Secondary | ICD-10-CM | POA: Diagnosis not present

## 2017-01-15 DIAGNOSIS — J449 Chronic obstructive pulmonary disease, unspecified: Secondary | ICD-10-CM

## 2017-01-15 DIAGNOSIS — C8330 Diffuse large B-cell lymphoma, unspecified site: Secondary | ICD-10-CM | POA: Diagnosis not present

## 2017-01-15 DIAGNOSIS — C61 Malignant neoplasm of prostate: Secondary | ICD-10-CM

## 2017-01-15 DIAGNOSIS — D696 Thrombocytopenia, unspecified: Secondary | ICD-10-CM

## 2017-01-15 DIAGNOSIS — R251 Tremor, unspecified: Secondary | ICD-10-CM

## 2017-01-15 DIAGNOSIS — I700 Atherosclerosis of aorta: Secondary | ICD-10-CM | POA: Diagnosis not present

## 2017-01-15 DIAGNOSIS — M5136 Other intervertebral disc degeneration, lumbar region: Secondary | ICD-10-CM

## 2017-01-15 DIAGNOSIS — Z5112 Encounter for antineoplastic immunotherapy: Secondary | ICD-10-CM | POA: Diagnosis not present

## 2017-01-15 DIAGNOSIS — K4090 Unilateral inguinal hernia, without obstruction or gangrene, not specified as recurrent: Secondary | ICD-10-CM | POA: Diagnosis not present

## 2017-01-15 DIAGNOSIS — Z803 Family history of malignant neoplasm of breast: Secondary | ICD-10-CM

## 2017-01-15 DIAGNOSIS — Z79899 Other long term (current) drug therapy: Secondary | ICD-10-CM

## 2017-01-15 DIAGNOSIS — D649 Anemia, unspecified: Secondary | ICD-10-CM | POA: Diagnosis not present

## 2017-01-15 DIAGNOSIS — R590 Localized enlarged lymph nodes: Secondary | ICD-10-CM | POA: Diagnosis not present

## 2017-01-15 LAB — COMPREHENSIVE METABOLIC PANEL
ALK PHOS: 50 U/L (ref 38–126)
ALT: 13 U/L — ABNORMAL LOW (ref 17–63)
AST: 23 U/L (ref 15–41)
Albumin: 3.8 g/dL (ref 3.5–5.0)
Anion gap: 8 (ref 5–15)
BILIRUBIN TOTAL: 0.7 mg/dL (ref 0.3–1.2)
BUN: 27 mg/dL — AB (ref 6–20)
CALCIUM: 9.2 mg/dL (ref 8.9–10.3)
CO2: 27 mmol/L (ref 22–32)
CREATININE: 1.06 mg/dL (ref 0.61–1.24)
Chloride: 104 mmol/L (ref 101–111)
GFR calc Af Amer: >60 mL/min (ref >60)
GFR, EST NON AFRICAN AMERICAN: 58 mL/min — AB (ref >60)
GLUCOSE: 69 mg/dL (ref 65–99)
POTASSIUM: 4.2 mmol/L (ref 3.5–5.1)
Sodium: 139 mmol/L (ref 135–145)
Total Protein: 6.5 g/dL (ref 6.5–8.1)

## 2017-01-15 LAB — CBC WITH DIFFERENTIAL/PLATELET
BASOS ABS: 0 10*3/uL (ref 0–0.1)
Basophils Relative: 0 %
EOS PCT: 1 %
Eosinophils Absolute: 0 10*3/uL (ref 0–0.7)
HEMATOCRIT: 38.6 % — AB (ref 40.0–52.0)
Hemoglobin: 12.7 g/dL — ABNORMAL LOW (ref 13.0–18.0)
LYMPHS ABS: 0.9 10*3/uL — AB (ref 1.0–3.6)
LYMPHS PCT: 13 %
MCH: 30.5 pg (ref 26.0–34.0)
MCHC: 32.8 g/dL (ref 32.0–36.0)
MCV: 93.1 fL (ref 80.0–100.0)
MONO ABS: 0.6 10*3/uL (ref 0.2–1.0)
MONOS PCT: 9 %
NEUTROS ABS: 5.3 10*3/uL (ref 1.4–6.5)
Neutrophils Relative %: 77 %
Platelets: 141 10*3/uL — ABNORMAL LOW (ref 150–440)
RBC: 4.15 MIL/uL — ABNORMAL LOW (ref 4.40–5.90)
RDW: 17.2 % — ABNORMAL HIGH (ref 11.5–14.5)
WBC: 6.9 10*3/uL (ref 3.8–10.6)

## 2017-01-15 LAB — PSA: PROSTATIC SPECIFIC ANTIGEN: 36.9 ng/mL — AB (ref 0.00–4.00)

## 2017-01-15 MED ORDER — SODIUM CHLORIDE 0.9 % IV SOLN
Freq: Once | INTRAVENOUS | Status: AC
  Administered 2017-01-15: 11:00:00 via INTRAVENOUS
  Filled 2017-01-15: qty 1000

## 2017-01-15 MED ORDER — ACETAMINOPHEN 325 MG PO TABS
650.0000 mg | ORAL_TABLET | Freq: Once | ORAL | Status: AC
  Administered 2017-01-15: 650 mg via ORAL
  Filled 2017-01-15: qty 2

## 2017-01-15 MED ORDER — SODIUM CHLORIDE 0.9 % IV SOLN
800.0000 mg | Freq: Once | INTRAVENOUS | Status: AC
  Administered 2017-01-15: 800 mg via INTRAVENOUS
  Filled 2017-01-15: qty 30

## 2017-01-15 MED ORDER — SODIUM CHLORIDE 0.9 % IV SOLN: 375.0000 mg/m2 | Freq: Once | INTRAVENOUS | Status: DC

## 2017-01-15 MED ORDER — DIPHENHYDRAMINE HCL 25 MG PO CAPS
25.0000 mg | ORAL_CAPSULE | Freq: Once | ORAL | Status: AC
  Administered 2017-01-15: 25 mg via ORAL
  Filled 2017-01-15: qty 1

## 2017-01-15 NOTE — Progress Notes (Signed)
Patient denies any concerns today.  

## 2017-01-20 ENCOUNTER — Encounter: Payer: Self-pay | Admitting: Family Medicine

## 2017-01-20 ENCOUNTER — Ambulatory Visit (INDEPENDENT_AMBULATORY_CARE_PROVIDER_SITE_OTHER): Payer: Medicare Other | Admitting: Family Medicine

## 2017-01-20 VITALS — BP 122/64 | HR 84 | Temp 97.8°F | Resp 16 | Wt 178.0 lb

## 2017-01-20 DIAGNOSIS — B351 Tinea unguium: Secondary | ICD-10-CM | POA: Diagnosis not present

## 2017-01-20 DIAGNOSIS — K219 Gastro-esophageal reflux disease without esophagitis: Secondary | ICD-10-CM

## 2017-01-20 DIAGNOSIS — R29898 Other symptoms and signs involving the musculoskeletal system: Secondary | ICD-10-CM | POA: Diagnosis not present

## 2017-01-20 DIAGNOSIS — G8929 Other chronic pain: Secondary | ICD-10-CM

## 2017-01-20 DIAGNOSIS — M545 Low back pain: Secondary | ICD-10-CM | POA: Diagnosis not present

## 2017-01-20 MED ORDER — RANITIDINE HCL 150 MG PO TABS: 150.0000 mg | ORAL_TABLET | Freq: Two times a day (BID) | ORAL | 11 refills | Status: AC

## 2017-01-20 NOTE — Progress Notes (Signed)
Patient: Steven Greene Male    DOB: 1919-08-10   83 y.o.   MRN: [id number] Visit Date: 01/20/2017  Today's Provider: Wilhemena Durie, MD   Chief Complaint  Patient presents with  . Back Pain   Subjective:    HPI   Daughter is requesting referral to podiatry for toenail maintenance. She would also like PT referral for leg weakness. Pt is concerned about left great toe pain. Denies erythremia.     Follow up for Back Pain  The patient was last seen for this 4 weeks ago. Changes made at last visit include adding Meloxicam and checking xrays. Xray showed arthritis and an old compression fracture. Pt was advised to stop the Meloxicam and start Prednisone daily.  He reports good compliance with treatment. He feels that condition is Improved. He states, "I am relatively pain free". He is having side effects. Sleep disturbance and increased appetite.  ------------------------------------------------------------------------------------  GERD, Follow up:  The patient was last seen for GERD 1 month ago. Changes made since that visit include adding Zantac 150 mg once daily.  He reports good compliance with treatment. He is not having side effects.   He states his sx are improved since starting this medication.  ------------------------------------------------------------------------     No Known Allergies   Current Outpatient Prescriptions:  .  Fluticasone-Salmeterol (ADVAIR DISKUS) 100-50 MCG/DOSE AEPB, Inhale 1 puff into the lungs 2 (two) times daily. (Patient taking differently: Inhale 1 puff into the lungs daily. ), Disp: 60 each, Rfl: 12 .  predniSONE (DELTASONE) 10 MG tablet, Take 3 tablets (30 mg total) by mouth daily with breakfast., Disp: 60 tablet, Rfl: 1 .  propranolol (INDERAL) 20 MG tablet, TAKE ONE TABLET BY MOUTH ONCE DAILY, Disp: 90 tablet, Rfl: 3 .  ranitidine (ZANTAC) 150 MG tablet, Take 1 tablet (150 mg total) by mouth 2 (two) times daily., Disp: 60  tablet, Rfl: 11 .  acetaminophen (TYLENOL) 500 MG tablet, Take 500 mg by mouth every 6 (six) hours as needed., Disp: , Rfl:   Review of Systems  Constitutional: Positive for appetite change and unexpected weight change. Negative for activity change, chills, diaphoresis, fatigue and fever.  Eyes: Negative.   Respiratory: Negative.   Cardiovascular: Negative.   Endocrine: Negative.   Musculoskeletal: Negative for back pain.  Allergic/Immunologic: Negative.   Hematological: Negative.   Psychiatric/Behavioral: Positive for sleep disturbance.    Social History  Substance Use Topics  . Smoking status: Never Smoker  . Smokeless tobacco: Never Used  . Alcohol use No   Objective:   BP 122/64 (BP Location: Right Arm, Patient Position: Sitting, Cuff Size: Normal)   Pulse 84   Temp 97.8 F (36.6 C) (Oral)   Resp 16   Wt 178 lb (80.7 kg)   BMI 25.54 kg/m  Vitals:   01/20/17 1602  BP: 122/64  Pulse: 84  Resp: 16  Temp: 97.8 F (36.6 C)  TempSrc: Oral  Weight: 178 lb (80.7 kg)     Physical Exam  Constitutional: He is oriented to person, place, and time. He appears well-developed and well-nourished.  Elderly WM NAD  HENT:  Head: Normocephalic and atraumatic.  Right Ear: External ear normal.  Left Ear: External ear normal.  Nose: Nose normal.  Eyes: Pupils are equal, round, and reactive to light. No scleral icterus.  Neck: No thyromegaly present.  Cardiovascular: Normal rate, regular rhythm and normal heart sounds.   Pulmonary/Chest: Effort normal and breath sounds normal. No  respiratory distress.  Abdominal: Soft.  Musculoskeletal: He exhibits no edema.  Ambulating with walker  Neurological: He is alert and oriented to person, place, and time.  Skin: Skin is warm and dry.  Psychiatric: He has a normal mood and affect. His behavior is normal. Judgment and thought content normal.        Assessment & Plan:     1. Weakness of both lower extremities Will refer for  strengthening.More than 50% of 25 minute visit spent in counseling. - Ambulatory referral to [place]  2. Gastroesophageal reflux disease, esophagitis presence not specified Improved. Refill Zantac. - ranitidine (ZANTAC) 150 MG tablet; Take 1 tablet (150 mg total) by mouth 2 (two) times daily.  Dispense: 60 tablet; Refill: 11  3. Chronic low back pain, unspecified back pain laterality, with sciatica presence unspecified Stable on Prednisone.  4. Onychomycosis of toenail Will refer. - Ambulatory referral to Podiatry  5.Metastatic Prostate Cancer 6.Lymphoma Discussed continuing Rx as this has helped him . Consider Hospice in future.       I have done the exam and reviewed the above chart and it is accurate to the best of my knowledge. Development worker, community has been used in this note in any air is in the dictation or transcription are unintentional.  Wilhemena Durie, MD  [place]

## 2017-01-22 ENCOUNTER — Ambulatory Visit (INDEPENDENT_AMBULATORY_CARE_PROVIDER_SITE_OTHER): Payer: Medicare Other | Admitting: Podiatry

## 2017-01-22 ENCOUNTER — Encounter: Payer: Self-pay | Admitting: Podiatry

## 2017-01-22 VITALS — BP 98/66 | HR 49

## 2017-01-22 DIAGNOSIS — B351 Tinea unguium: Secondary | ICD-10-CM | POA: Diagnosis not present

## 2017-01-22 DIAGNOSIS — M79674 Pain in right toe(s): Secondary | ICD-10-CM

## 2017-01-22 DIAGNOSIS — M79675 Pain in left toe(s): Secondary | ICD-10-CM

## 2017-01-22 NOTE — Progress Notes (Signed)
   Subjective:    Patient ID: Steven Greene, male    DOB: 1920-02-12, 83 y.o.   MRN: [id number]  HPI this patient presents the office for an evaluation and treatment of his long thick painful nails  . He says he has especially painful second and third toes on the left foot, which have grown thick long incurved.  His first and second toenails on his right foot are absent.  Patient presents the office today for an evaluation and treatment of his nails.    Review of Systems  All other systems reviewed and are negative.      Objective:   Physical Exam General Appearance  Alert, conversant and in no acute stress.  Vascular  Dorsalis pedis and posterior pulses are not  palpable  bilaterally.  Capillary return is within normal limits  Bilaterally. Cold feet noted.  Neurologic  Senn-Weinstein monofilament wire test within normal limits  bilaterally. Muscle power  Within normal limits bilaterally.  Nails Thick disfigured discolored nails with subungual debris first  through fifth toes left and third from fifth toes right foot. No evidence of bacterial infection or drainage bilaterally. His right hallux toenail was self avulsed.  Second right toenail fell off itself.  His second and third toe have curvature in the nail plate.  Orthopedic  No limitations of motion of motion feet bilaterally.  No crepitus or effusions noted.  No bony pathology or digital deformities noted.  Skin  normotropic skin with no porokeratosis noted bilaterally.  No signs of infections or ulcers noted.          Assessment & Plan:  Onychomycosis  X 8    IE  Debride nails.  2. Iatrogenic lesions were noted on the second and third toes of the left foot at the site of the curvature.  These were cauterized.  Patient was instructed to return in 3 months, but said he would call himself.   Gardiner Barefoot DPM

## 2017-01-23 DIAGNOSIS — C61 Malignant neoplasm of prostate: Secondary | ICD-10-CM | POA: Diagnosis not present

## 2017-01-27 ENCOUNTER — Telehealth: Payer: Self-pay | Admitting: Family Medicine

## 2017-01-27 NOTE — Telephone Encounter (Signed)
[name] with [facility] is requesting verbal orders for physical therapy as follows:  Once a week for one week and twice a week for 4 weeks. Please advise. Thanks TNP

## 2017-01-27 NOTE — Telephone Encounter (Signed)
ok 

## 2017-01-27 NOTE — Telephone Encounter (Signed)
Please review-Adyn Hoes V Chivon Lepage, RMA  

## 2017-01-28 NOTE — Telephone Encounter (Signed)
[name] advised-[name], RMA

## 2017-01-29 DIAGNOSIS — C61 Malignant neoplasm of prostate: Secondary | ICD-10-CM | POA: Diagnosis not present

## 2017-01-29 DIAGNOSIS — G8929 Other chronic pain: Secondary | ICD-10-CM | POA: Diagnosis not present

## 2017-01-29 DIAGNOSIS — M40294 Other kyphosis, thoracic region: Secondary | ICD-10-CM | POA: Diagnosis not present

## 2017-01-29 DIAGNOSIS — I10 Essential (primary) hypertension: Secondary | ICD-10-CM | POA: Diagnosis not present

## 2017-01-29 DIAGNOSIS — M545 Low back pain: Secondary | ICD-10-CM | POA: Diagnosis not present

## 2017-01-29 DIAGNOSIS — C8330 Diffuse large B-cell lymphoma, unspecified site: Secondary | ICD-10-CM | POA: Diagnosis not present

## 2017-01-29 DIAGNOSIS — M21372 Foot drop, left foot: Secondary | ICD-10-CM | POA: Diagnosis not present

## 2017-01-29 DIAGNOSIS — Z9181 History of falling: Secondary | ICD-10-CM | POA: Diagnosis not present

## 2017-01-29 DIAGNOSIS — K219 Gastro-esophageal reflux disease without esophagitis: Secondary | ICD-10-CM | POA: Diagnosis not present

## 2017-01-29 DIAGNOSIS — H9190 Unspecified hearing loss, unspecified ear: Secondary | ICD-10-CM | POA: Diagnosis not present

## 2017-01-29 DIAGNOSIS — M159 Polyosteoarthritis, unspecified: Secondary | ICD-10-CM | POA: Diagnosis not present

## 2017-01-29 DIAGNOSIS — J449 Chronic obstructive pulmonary disease, unspecified: Secondary | ICD-10-CM | POA: Diagnosis not present

## 2017-01-29 DIAGNOSIS — N400 Enlarged prostate without lower urinary tract symptoms: Secondary | ICD-10-CM | POA: Diagnosis not present

## 2017-01-29 DIAGNOSIS — Z7951 Long term (current) use of inhaled steroids: Secondary | ICD-10-CM | POA: Diagnosis not present

## 2017-01-29 DIAGNOSIS — C7951 Secondary malignant neoplasm of bone: Secondary | ICD-10-CM | POA: Diagnosis not present

## 2017-01-30 DIAGNOSIS — C61 Malignant neoplasm of prostate: Secondary | ICD-10-CM | POA: Diagnosis not present

## 2017-01-30 DIAGNOSIS — Z7951 Long term (current) use of inhaled steroids: Secondary | ICD-10-CM | POA: Diagnosis not present

## 2017-01-30 DIAGNOSIS — M40294 Other kyphosis, thoracic region: Secondary | ICD-10-CM | POA: Diagnosis not present

## 2017-01-30 DIAGNOSIS — M545 Low back pain: Secondary | ICD-10-CM | POA: Diagnosis not present

## 2017-01-30 DIAGNOSIS — G8929 Other chronic pain: Secondary | ICD-10-CM | POA: Diagnosis not present

## 2017-01-30 DIAGNOSIS — C7951 Secondary malignant neoplasm of bone: Secondary | ICD-10-CM | POA: Diagnosis not present

## 2017-01-30 DIAGNOSIS — K219 Gastro-esophageal reflux disease without esophagitis: Secondary | ICD-10-CM | POA: Diagnosis not present

## 2017-01-30 DIAGNOSIS — M159 Polyosteoarthritis, unspecified: Secondary | ICD-10-CM | POA: Diagnosis not present

## 2017-01-30 DIAGNOSIS — C8330 Diffuse large B-cell lymphoma, unspecified site: Secondary | ICD-10-CM | POA: Diagnosis not present

## 2017-01-30 DIAGNOSIS — I10 Essential (primary) hypertension: Secondary | ICD-10-CM | POA: Diagnosis not present

## 2017-01-30 DIAGNOSIS — Z9181 History of falling: Secondary | ICD-10-CM | POA: Diagnosis not present

## 2017-01-30 DIAGNOSIS — M21372 Foot drop, left foot: Secondary | ICD-10-CM | POA: Diagnosis not present

## 2017-01-30 DIAGNOSIS — N400 Enlarged prostate without lower urinary tract symptoms: Secondary | ICD-10-CM | POA: Diagnosis not present

## 2017-01-30 DIAGNOSIS — H9190 Unspecified hearing loss, unspecified ear: Secondary | ICD-10-CM | POA: Diagnosis not present

## 2017-01-30 DIAGNOSIS — J449 Chronic obstructive pulmonary disease, unspecified: Secondary | ICD-10-CM | POA: Diagnosis not present

## 2017-02-02 DIAGNOSIS — N400 Enlarged prostate without lower urinary tract symptoms: Secondary | ICD-10-CM | POA: Diagnosis not present

## 2017-02-02 DIAGNOSIS — Z7951 Long term (current) use of inhaled steroids: Secondary | ICD-10-CM | POA: Diagnosis not present

## 2017-02-02 DIAGNOSIS — C61 Malignant neoplasm of prostate: Secondary | ICD-10-CM | POA: Diagnosis not present

## 2017-02-02 DIAGNOSIS — M545 Low back pain: Secondary | ICD-10-CM | POA: Diagnosis not present

## 2017-02-02 DIAGNOSIS — H9190 Unspecified hearing loss, unspecified ear: Secondary | ICD-10-CM | POA: Diagnosis not present

## 2017-02-02 DIAGNOSIS — I10 Essential (primary) hypertension: Secondary | ICD-10-CM | POA: Diagnosis not present

## 2017-02-02 DIAGNOSIS — M159 Polyosteoarthritis, unspecified: Secondary | ICD-10-CM | POA: Diagnosis not present

## 2017-02-02 DIAGNOSIS — C7951 Secondary malignant neoplasm of bone: Secondary | ICD-10-CM | POA: Diagnosis not present

## 2017-02-02 DIAGNOSIS — G8929 Other chronic pain: Secondary | ICD-10-CM | POA: Diagnosis not present

## 2017-02-02 DIAGNOSIS — J449 Chronic obstructive pulmonary disease, unspecified: Secondary | ICD-10-CM | POA: Diagnosis not present

## 2017-02-02 DIAGNOSIS — C8330 Diffuse large B-cell lymphoma, unspecified site: Secondary | ICD-10-CM | POA: Diagnosis not present

## 2017-02-02 DIAGNOSIS — M21372 Foot drop, left foot: Secondary | ICD-10-CM | POA: Diagnosis not present

## 2017-02-02 DIAGNOSIS — M40294 Other kyphosis, thoracic region: Secondary | ICD-10-CM | POA: Diagnosis not present

## 2017-02-02 DIAGNOSIS — K219 Gastro-esophageal reflux disease without esophagitis: Secondary | ICD-10-CM | POA: Diagnosis not present

## 2017-02-02 DIAGNOSIS — Z9181 History of falling: Secondary | ICD-10-CM | POA: Diagnosis not present

## 2017-02-05 DIAGNOSIS — M545 Low back pain: Secondary | ICD-10-CM | POA: Diagnosis not present

## 2017-02-05 DIAGNOSIS — C61 Malignant neoplasm of prostate: Secondary | ICD-10-CM | POA: Diagnosis not present

## 2017-02-05 DIAGNOSIS — C8330 Diffuse large B-cell lymphoma, unspecified site: Secondary | ICD-10-CM | POA: Diagnosis not present

## 2017-02-05 DIAGNOSIS — M21372 Foot drop, left foot: Secondary | ICD-10-CM | POA: Diagnosis not present

## 2017-02-05 DIAGNOSIS — N400 Enlarged prostate without lower urinary tract symptoms: Secondary | ICD-10-CM | POA: Diagnosis not present

## 2017-02-05 DIAGNOSIS — M40294 Other kyphosis, thoracic region: Secondary | ICD-10-CM | POA: Diagnosis not present

## 2017-02-05 DIAGNOSIS — K219 Gastro-esophageal reflux disease without esophagitis: Secondary | ICD-10-CM | POA: Diagnosis not present

## 2017-02-05 DIAGNOSIS — C7951 Secondary malignant neoplasm of bone: Secondary | ICD-10-CM | POA: Diagnosis not present

## 2017-02-05 DIAGNOSIS — H9190 Unspecified hearing loss, unspecified ear: Secondary | ICD-10-CM | POA: Diagnosis not present

## 2017-02-05 DIAGNOSIS — M159 Polyosteoarthritis, unspecified: Secondary | ICD-10-CM | POA: Diagnosis not present

## 2017-02-05 DIAGNOSIS — Z9181 History of falling: Secondary | ICD-10-CM | POA: Diagnosis not present

## 2017-02-05 DIAGNOSIS — J449 Chronic obstructive pulmonary disease, unspecified: Secondary | ICD-10-CM | POA: Diagnosis not present

## 2017-02-05 DIAGNOSIS — G8929 Other chronic pain: Secondary | ICD-10-CM | POA: Diagnosis not present

## 2017-02-05 DIAGNOSIS — Z7951 Long term (current) use of inhaled steroids: Secondary | ICD-10-CM | POA: Diagnosis not present

## 2017-02-05 DIAGNOSIS — I10 Essential (primary) hypertension: Secondary | ICD-10-CM | POA: Diagnosis not present

## 2017-02-09 ENCOUNTER — Telehealth: Payer: Self-pay | Admitting: Family Medicine

## 2017-02-09 DIAGNOSIS — N400 Enlarged prostate without lower urinary tract symptoms: Secondary | ICD-10-CM | POA: Diagnosis not present

## 2017-02-09 DIAGNOSIS — Z7951 Long term (current) use of inhaled steroids: Secondary | ICD-10-CM | POA: Diagnosis not present

## 2017-02-09 DIAGNOSIS — M545 Low back pain: Secondary | ICD-10-CM | POA: Diagnosis not present

## 2017-02-09 DIAGNOSIS — M159 Polyosteoarthritis, unspecified: Secondary | ICD-10-CM | POA: Diagnosis not present

## 2017-02-09 DIAGNOSIS — C61 Malignant neoplasm of prostate: Secondary | ICD-10-CM | POA: Diagnosis not present

## 2017-02-09 DIAGNOSIS — C7951 Secondary malignant neoplasm of bone: Secondary | ICD-10-CM | POA: Diagnosis not present

## 2017-02-09 DIAGNOSIS — H9190 Unspecified hearing loss, unspecified ear: Secondary | ICD-10-CM | POA: Diagnosis not present

## 2017-02-09 DIAGNOSIS — J449 Chronic obstructive pulmonary disease, unspecified: Secondary | ICD-10-CM | POA: Diagnosis not present

## 2017-02-09 DIAGNOSIS — C8330 Diffuse large B-cell lymphoma, unspecified site: Secondary | ICD-10-CM | POA: Diagnosis not present

## 2017-02-09 DIAGNOSIS — I10 Essential (primary) hypertension: Secondary | ICD-10-CM | POA: Diagnosis not present

## 2017-02-09 DIAGNOSIS — Z9181 History of falling: Secondary | ICD-10-CM | POA: Diagnosis not present

## 2017-02-09 DIAGNOSIS — G8929 Other chronic pain: Secondary | ICD-10-CM | POA: Diagnosis not present

## 2017-02-09 DIAGNOSIS — M40294 Other kyphosis, thoracic region: Secondary | ICD-10-CM | POA: Diagnosis not present

## 2017-02-09 DIAGNOSIS — M21372 Foot drop, left foot: Secondary | ICD-10-CM | POA: Diagnosis not present

## 2017-02-09 DIAGNOSIS — K219 Gastro-esophageal reflux disease without esophagitis: Secondary | ICD-10-CM | POA: Diagnosis not present

## 2017-02-09 NOTE — Telephone Encounter (Signed)
Please review-Steven Greene Steven Greene, RMA  

## 2017-02-09 NOTE — Telephone Encounter (Signed)
Steven Greene with [facility] request a verbal order to extend physical therapy.  2 times a week for 4 weeks.  HU#[phone]/SF

## 2017-02-10 NOTE — Telephone Encounter (Signed)
ok 

## 2017-02-10 NOTE — Telephone Encounter (Signed)
Steven Greene advised on her voicemail-Marili Vader [employer], RMA

## 2017-02-11 DIAGNOSIS — M545 Low back pain: Secondary | ICD-10-CM | POA: Diagnosis not present

## 2017-02-11 DIAGNOSIS — M40294 Other kyphosis, thoracic region: Secondary | ICD-10-CM | POA: Diagnosis not present

## 2017-02-11 DIAGNOSIS — Z9181 History of falling: Secondary | ICD-10-CM | POA: Diagnosis not present

## 2017-02-11 DIAGNOSIS — M21372 Foot drop, left foot: Secondary | ICD-10-CM | POA: Diagnosis not present

## 2017-02-11 DIAGNOSIS — Z7951 Long term (current) use of inhaled steroids: Secondary | ICD-10-CM | POA: Diagnosis not present

## 2017-02-11 DIAGNOSIS — H9190 Unspecified hearing loss, unspecified ear: Secondary | ICD-10-CM | POA: Diagnosis not present

## 2017-02-11 DIAGNOSIS — K219 Gastro-esophageal reflux disease without esophagitis: Secondary | ICD-10-CM | POA: Diagnosis not present

## 2017-02-11 DIAGNOSIS — C7951 Secondary malignant neoplasm of bone: Secondary | ICD-10-CM | POA: Diagnosis not present

## 2017-02-11 DIAGNOSIS — C61 Malignant neoplasm of prostate: Secondary | ICD-10-CM | POA: Diagnosis not present

## 2017-02-11 DIAGNOSIS — N400 Enlarged prostate without lower urinary tract symptoms: Secondary | ICD-10-CM | POA: Diagnosis not present

## 2017-02-11 DIAGNOSIS — M159 Polyosteoarthritis, unspecified: Secondary | ICD-10-CM | POA: Diagnosis not present

## 2017-02-11 DIAGNOSIS — G8929 Other chronic pain: Secondary | ICD-10-CM | POA: Diagnosis not present

## 2017-02-11 DIAGNOSIS — J449 Chronic obstructive pulmonary disease, unspecified: Secondary | ICD-10-CM | POA: Diagnosis not present

## 2017-02-11 DIAGNOSIS — C8330 Diffuse large B-cell lymphoma, unspecified site: Secondary | ICD-10-CM | POA: Diagnosis not present

## 2017-02-11 DIAGNOSIS — I10 Essential (primary) hypertension: Secondary | ICD-10-CM | POA: Diagnosis not present

## 2017-02-17 ENCOUNTER — Telehealth: Payer: Self-pay | Admitting: Family Medicine

## 2017-02-17 NOTE — Telephone Encounter (Signed)
Pt's daughter Vaughan Basta called b/c pt had been contacted to schedule AWV for 02/17/17. Vaughan Basta stated that it is difficult to get pt to appts and she can't get him here on 02/18/17 and requested to schedule AWV on 04/13/17 b/c pt was already scheduled with Dr. Rosanna Randy. Vaughan Basta stated she would also contact [employer] to see if they have a program where pt can get AWV from home so he will have it for 2018. Thanks TNP

## 2017-02-19 ENCOUNTER — Ambulatory Visit: Payer: Self-pay

## 2017-02-19 DIAGNOSIS — M159 Polyosteoarthritis, unspecified: Secondary | ICD-10-CM | POA: Diagnosis not present

## 2017-02-19 DIAGNOSIS — K219 Gastro-esophageal reflux disease without esophagitis: Secondary | ICD-10-CM | POA: Diagnosis not present

## 2017-02-19 DIAGNOSIS — H9190 Unspecified hearing loss, unspecified ear: Secondary | ICD-10-CM | POA: Diagnosis not present

## 2017-02-19 DIAGNOSIS — Z9181 History of falling: Secondary | ICD-10-CM | POA: Diagnosis not present

## 2017-02-19 DIAGNOSIS — Z7951 Long term (current) use of inhaled steroids: Secondary | ICD-10-CM | POA: Diagnosis not present

## 2017-02-19 DIAGNOSIS — M545 Low back pain: Secondary | ICD-10-CM | POA: Diagnosis not present

## 2017-02-19 DIAGNOSIS — G8929 Other chronic pain: Secondary | ICD-10-CM | POA: Diagnosis not present

## 2017-02-19 DIAGNOSIS — C61 Malignant neoplasm of prostate: Secondary | ICD-10-CM | POA: Diagnosis not present

## 2017-02-19 DIAGNOSIS — M21372 Foot drop, left foot: Secondary | ICD-10-CM | POA: Diagnosis not present

## 2017-02-19 DIAGNOSIS — C8330 Diffuse large B-cell lymphoma, unspecified site: Secondary | ICD-10-CM | POA: Diagnosis not present

## 2017-02-19 DIAGNOSIS — C7951 Secondary malignant neoplasm of bone: Secondary | ICD-10-CM | POA: Diagnosis not present

## 2017-02-19 DIAGNOSIS — J449 Chronic obstructive pulmonary disease, unspecified: Secondary | ICD-10-CM | POA: Diagnosis not present

## 2017-02-19 DIAGNOSIS — M40294 Other kyphosis, thoracic region: Secondary | ICD-10-CM | POA: Diagnosis not present

## 2017-02-19 DIAGNOSIS — I10 Essential (primary) hypertension: Secondary | ICD-10-CM | POA: Diagnosis not present

## 2017-02-19 DIAGNOSIS — N400 Enlarged prostate without lower urinary tract symptoms: Secondary | ICD-10-CM | POA: Diagnosis not present

## 2017-02-20 DIAGNOSIS — G8929 Other chronic pain: Secondary | ICD-10-CM | POA: Diagnosis not present

## 2017-02-20 DIAGNOSIS — M545 Low back pain: Secondary | ICD-10-CM | POA: Diagnosis not present

## 2017-02-20 DIAGNOSIS — M40294 Other kyphosis, thoracic region: Secondary | ICD-10-CM | POA: Diagnosis not present

## 2017-02-20 DIAGNOSIS — M21372 Foot drop, left foot: Secondary | ICD-10-CM | POA: Diagnosis not present

## 2017-02-20 DIAGNOSIS — C7951 Secondary malignant neoplasm of bone: Secondary | ICD-10-CM | POA: Diagnosis not present

## 2017-02-20 DIAGNOSIS — Z9181 History of falling: Secondary | ICD-10-CM | POA: Diagnosis not present

## 2017-02-20 DIAGNOSIS — H9190 Unspecified hearing loss, unspecified ear: Secondary | ICD-10-CM | POA: Diagnosis not present

## 2017-02-20 DIAGNOSIS — I10 Essential (primary) hypertension: Secondary | ICD-10-CM | POA: Diagnosis not present

## 2017-02-20 DIAGNOSIS — C8330 Diffuse large B-cell lymphoma, unspecified site: Secondary | ICD-10-CM | POA: Diagnosis not present

## 2017-02-20 DIAGNOSIS — K219 Gastro-esophageal reflux disease without esophagitis: Secondary | ICD-10-CM | POA: Diagnosis not present

## 2017-02-20 DIAGNOSIS — J449 Chronic obstructive pulmonary disease, unspecified: Secondary | ICD-10-CM | POA: Diagnosis not present

## 2017-02-20 DIAGNOSIS — M159 Polyosteoarthritis, unspecified: Secondary | ICD-10-CM | POA: Diagnosis not present

## 2017-02-20 DIAGNOSIS — Z7951 Long term (current) use of inhaled steroids: Secondary | ICD-10-CM | POA: Diagnosis not present

## 2017-02-20 DIAGNOSIS — C61 Malignant neoplasm of prostate: Secondary | ICD-10-CM | POA: Diagnosis not present

## 2017-02-20 DIAGNOSIS — N400 Enlarged prostate without lower urinary tract symptoms: Secondary | ICD-10-CM | POA: Diagnosis not present

## 2017-02-22 NOTE — Progress Notes (Signed)
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  INTERVAL HISTORY: Patient returns to clinic today for further evaluation and discussion of his imaging results. He continues to feel well and is asymptomatic. He does not complain of weakness or fatigue. He reports no bleeding or bruising. He has no neurologic complaints. He denies any recent fevers or chills. He has a good appetite, but continues to lose weight slowly. He denies any chest pain, shortness of breath, cough, or hemoptysis.  He has no nausea, vomiting, constipation, or diarrhea. He has no urinary complaints. Patient offers no specific complaints today.  REVIEW OF SYSTEMS:   Review of Systems  Constitutional: Positive for weight loss. Negative for fever and malaise/fatigue.  Respiratory: Negative for cough and shortness of breath.   Cardiovascular: Negative.  Negative for chest pain and leg swelling.  Gastrointestinal: Negative.  Negative for abdominal pain, constipation, diarrhea, nausea and vomiting.  Genitourinary: Negative.   Musculoskeletal: Negative.  Negative for joint pain.  Skin: Negative.  Negative for rash.  Neurological: Negative.  Negative for tingling, sensory change, weakness and headaches.  Endo/Heme/Allergies: Does not bruise/bleed easily.  Psychiatric/Behavioral: Negative.  The patient is not nervous/anxious.     As per HPI. Otherwise, a complete review of systems is negative.  PAST MEDICAL HISTORY: Past Medical History:  Diagnosis Date  . Arthritis   . Asthma   . Cancer [facility])    prostate cancer-treated medically  . COPD (chronic obstructive pulmonary disease) ([place])   . Foot drop, left   .  Hernia, inguinal, left   . HOH (hard of hearing)   . Tremors of nervous system     PAST SURGICAL HISTORY: Past Surgical History:  Procedure Laterality Date  . ANKLE FRACTURE SURGERY  2009   lt  . ANKLE FRACTURE SURGERY     rt  . EYE SURGERY     both cataracts  . HEMORRHOID SURGERY    . JOINT REPLACEMENT  2000   rt hip replacement  . WRIST FRACTURE SURGERY Bilateral     FAMILY HISTORY: Family History  Problem Relation Age of Onset  . Heart attack Father   . Tremor Father   . Cancer Sister   . Stomach cancer Brother   . Heart attack Sister   . Heart attack Sister     ADVANCED DIRECTIVES (Y/N):  N  HEALTH MAINTENANCE: Social History   Tobacco Use  . Smoking status: Never Smoker  . Smokeless tobacco: Never Used  Substance Use Topics  . Alcohol use: No  . Drug use: No     Colonoscopy:  PAP:  Bone density:  Lipid panel:  No Known Allergies  Current Outpatient Medications  Medication Sig Dispense Refill  . acetaminophen (TYLENOL) 500 MG tablet Take 500 mg by mouth every 6 (six) hours as needed.    . Fluticasone-Salmeterol (ADVAIR DISKUS) 100-50 MCG/DOSE AEPB Inhale 1 puff into the lungs 2 (two) times daily. (Patient taking differently: Inhale 1 puff into the lungs daily. ) 60 each 12  . predniSONE (DELTASONE) 10 MG tablet Take 3 tablets (30 mg total) by mouth daily with breakfast. 60 tablet 1  . propranolol (INDERAL) 20 MG tablet TAKE ONE TABLET BY MOUTH ONCE DAILY 90 tablet  3  . ranitidine (ZANTAC) 150 MG tablet Take 1 tablet (150 mg total) by mouth 2 (two) times daily. 60 tablet 11   No current facility-administered medications for this visit.     OBJECTIVE: Vitals:   02/25/17 1200  BP: (!) 155/74  Pulse: 71  Resp: 20  Temp: (!) 95.2 F (35.1 C)     Body mass index is 25.54 kg/m.    ECOG FS:1 - Symptomatic but completely ambulatory  General: Well-developed, well-nourished, no acute distress. Eyes: Pink conjunctiva, anicteric sclera. HEENT:  Normocephalic, moist mucous membranes, clear oropharnyx. Lungs: Clear to auscultation bilaterally. Heart: Regular rate and rhythm. No rubs, murmurs, or gallops. Abdomen: Soft, nontender, nondistended. No organomegaly noted, normoactive bowel sounds. Musculoskeletal: No edema, cyanosis, or clubbing. Neuro: Alert, answering all questions appropriately. Cranial nerves grossly intact. Skin: No rashes or petechiae noted. Psych: Normal affect. Lymphatics: Easily palpable left axillary lympadenopathy.  Unchanged.  LAB RESULTS:  Lab Results  Component Value Date   NA 135 02/25/2017   K 4.5 02/25/2017   CL 101 02/25/2017   CO2 24 02/25/2017   GLUCOSE 122 (H) 02/25/2017   BUN 28 (H) 02/25/2017   CREATININE 1.10 02/25/2017   CALCIUM 9.0 02/25/2017   PROT 6.3 (L) 02/25/2017   ALBUMIN 3.4 (L) 02/25/2017   AST 25 02/25/2017   ALT 14 (L) 02/25/2017   ALKPHOS 50 02/25/2017   BILITOT 0.5 02/25/2017   GFRNONAA 55 (L) 02/25/2017   GFRAA >60 02/25/2017    Lab Results  Component Value Date   WBC 8.8 02/25/2017   NEUTROABS 7.7 (H) 02/25/2017   HGB 12.1 (L) 02/25/2017   HCT 37.0 (L) 02/25/2017   MCV 91.9 02/25/2017   PLT 161 02/25/2017     STUDIES: Nm Pet Image Restag (ps) Skull Base To Thigh  Result Date: 02/23/2017 CLINICAL DATA:  Subsequent treatment strategy for diffuse large B-cell lymphoma. Status post steroids, radiation therapy. Chemotherapy last 2 months ago. EXAM: NUCLEAR MEDICINE PET SKULL BASE TO THIGH TECHNIQUE: 13.0 mCi F-18 FDG was injected intravenously. Full-ring PET imaging was performed from the skull base to thigh after the radiotracer. CT data was obtained and used for attenuation correction and anatomic localization. FASTING BLOOD GLUCOSE:  Value: 96 Mg/dl COMPARISON:  11/28/2016 PET. FINDINGS: NECK: Low right cervical/ supraclavicular node measures 5 mm and a S.U.V. max of 1.0 on image 41/ series 4. Compare 6 mm and a S.U.V. max of 2.4 on the prior exam. Left carotid  atherosclerosis. No cervical adenopathy. Mucosal thickening of the left maxillary sinus. CHEST: Right axillary nodal mass measures 5.0 x 2.8 cm and a S.U.V. max of 48.3 on image 88/ series 4. Compare 3.5 x 2.5 and a S.U.V. max of 46.3 on the prior exam. The Retrocrural adenopathy which measures 4.0 cm and a S.U.V. max of 16.6 today versus 1.4 cm anti a S.U.V. max of 5.8 on the prior exam. A left chest wall skin lesion measures 11 mm and a S.U.V. max of 2.7 today versus 8 mm and not hypermetabolic on the prior exam. Suspect new right pleural hypermetabolism, with a focus measuring a S.U.V. max of 2.3 in the right costophrenic angle. Mild cardiomegaly. Lad coronary artery atherosclerosis. Tiny hiatal hernia. Bibasilar scarring or subsegmental atelectasis. ABDOMEN/PELVIS: New hypermetabolic omental nodule which measures 7 mm and a S.U.V. max of 4.5 on image 139/series 4. Retroperitoneal adenopathy, including a pericaval nodal mass which is difficult to differentiate from the adjacent IVC. 4.3 x 4.5 cm and a S.U.V. max of  21.6 versus 4.6 x 3.2 cm and a S.U.V. max of 28.6 on the prior exam. Right common iliac nodal mass which measures 3.6 x 4.6 cm and a S.U.V. max of 17.1 today versus 4.4 x 3.4 cm and a S.U.V. max of 29.1 on the prior exam. Left external iliac node measures 1.6 cm and a S.U.V. max of 4.7 today versus 2.1 cm and a S.U.V. max of 11.0 on the prior. Normal adrenal glands. Mild renal cortical thinning bilaterally. renal low-density lesions are likely cysts. Abdominal aortic atherosclerosis. No bowel obstruction or other acute complication. Degraded evaluation of the pelvis, secondary to beam hardening artifact from right hip arthroplasty. Extensive colonic diverticulosis. Moderate prostatomegaly. Fat containing left inguinal hernia. SKELETON: No abnormal marrow activity. Right hip arthroplasty. Osteopenia. Upper lumbar compression deformities, including at L1-L2, similar. IMPRESSION: 1. Persistent  hypermetabolic lymphoma. Enlargement of nodes within the chest and abdomen, with varying levels of hypermetabolism, some increased and some decreased. New right pleural and omental disease. Nodes within the low right neck and pelvis have regressed in both size and hypermetabolism. 2. Subcutaneous chest wall hypermetabolic lesion is suspicious for lymphomatous involvement. Consider physical exam correlation. 3. Coronary artery atherosclerosis. Aortic Atherosclerosis (ICD10-I70.0). 4. Small hiatal hernia. 5. Osteopenia with multiple lumbar compression deformities. Electronically Signed   By: Abigail Miyamoto M.D.   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  PLAN:    1. 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 Patient's most recent PSA is trending up from 32.88 to 40.48.  His daughter expressed interest in reinitiating Delton See which will be scheduled for his next clinic appointment.   2. At least stage III diffuse large B-cell lymphoma: PET scan results from February 23, 2017 reviewed independently and reported as above with essentially equivocal disease.  Patient does not wish any further treatment at this time and he continues to decline aggressive chemotherapy.  He did agree to return to clinic in 3 months with repeat imaging and reconsideration of treatment.  He has been instructed to decrease his prednisone to 10 mg daily. 3. Anemia: Patient's hemoglobin is slightly decreased, monitor.  4. Thrombocytopenia: Resolved. 5. Bony metastasis: Nuclear med bone scan results reviewed independently. Patient has completed palliative XRT to the lesion in his right leg. PET scan results as above.  Daughter has requested to reinitiate Xgeva and next clinic visit.  Approximately 30 minutes was spent in discussion of which greater than 50%  was consultation.  Patient expressed understanding and was in agreement with this plan. He also understands that He can call clinic at any time with any questions, concerns, or complaints.   Cancer Staging DLBCL (diffuse large B cell lymphoma) (HCC) Staging form: Hodgkin and Non-Hodgkin Lymphoma, AJCC 8th Edition - Clinical stage from 04/28/2016: Stage III (Diffuse large B-cell lymphoma) - Signed by Lloyd Huger, MD on 04/28/2016  Prostate cancer metastatic to bone [facility]) Staging form: Prostate, AJCC 7th Edition - Clinical stage from 09/24/2014: Stage IV (TX, NX, M1b, PSA: 20 or greater, Gleason X - Gleason score cannot be processed) - Signed by Lloyd Huger, MD on 09/24/2014  Lloyd Huger, MD 02/25/17 2:32 PM

## 2017-02-23 ENCOUNTER — Encounter
Admission: RE | Admit: 2017-02-23 | Discharge: 2017-02-23 | Disposition: A | Discharge status: Home / Self Care | Payer: Medicare Other | Source: Ambulatory Visit | Attending: Oncology | Admitting: Oncology

## 2017-02-23 DIAGNOSIS — C8330 Diffuse large B-cell lymphoma, unspecified site: Secondary | ICD-10-CM | POA: Diagnosis not present

## 2017-02-23 DIAGNOSIS — C8338 Diffuse large B-cell lymphoma, lymph nodes of multiple sites: Secondary | ICD-10-CM | POA: Insufficient documentation

## 2017-02-23 LAB — GLUCOSE, CAPILLARY: GLUCOSE-CAPILLARY: 96 mg/dL (ref 65–99)

## 2017-02-23 MED ORDER — FLUDEOXYGLUCOSE F - 18 (FDG) INJECTION
12.9600 | Freq: Once | INTRAVENOUS | Status: AC | PRN
  Administered 2017-02-23: 12.96 via INTRAVENOUS

## 2017-02-24 ENCOUNTER — Other Ambulatory Visit: Payer: Self-pay | Admitting: Oncology

## 2017-02-24 DIAGNOSIS — C7951 Secondary malignant neoplasm of bone: Secondary | ICD-10-CM | POA: Diagnosis not present

## 2017-02-24 DIAGNOSIS — C61 Malignant neoplasm of prostate: Secondary | ICD-10-CM | POA: Diagnosis not present

## 2017-02-24 DIAGNOSIS — M40294 Other kyphosis, thoracic region: Secondary | ICD-10-CM | POA: Diagnosis not present

## 2017-02-24 DIAGNOSIS — J449 Chronic obstructive pulmonary disease, unspecified: Secondary | ICD-10-CM | POA: Diagnosis not present

## 2017-02-24 DIAGNOSIS — H9190 Unspecified hearing loss, unspecified ear: Secondary | ICD-10-CM | POA: Diagnosis not present

## 2017-02-24 DIAGNOSIS — G8929 Other chronic pain: Secondary | ICD-10-CM | POA: Diagnosis not present

## 2017-02-24 DIAGNOSIS — M545 Low back pain: Secondary | ICD-10-CM | POA: Diagnosis not present

## 2017-02-24 DIAGNOSIS — M21372 Foot drop, left foot: Secondary | ICD-10-CM | POA: Diagnosis not present

## 2017-02-24 DIAGNOSIS — C8330 Diffuse large B-cell lymphoma, unspecified site: Secondary | ICD-10-CM | POA: Diagnosis not present

## 2017-02-24 DIAGNOSIS — N400 Enlarged prostate without lower urinary tract symptoms: Secondary | ICD-10-CM | POA: Diagnosis not present

## 2017-02-24 DIAGNOSIS — M159 Polyosteoarthritis, unspecified: Secondary | ICD-10-CM | POA: Diagnosis not present

## 2017-02-24 DIAGNOSIS — I10 Essential (primary) hypertension: Secondary | ICD-10-CM | POA: Diagnosis not present

## 2017-02-24 DIAGNOSIS — K219 Gastro-esophageal reflux disease without esophagitis: Secondary | ICD-10-CM | POA: Diagnosis not present

## 2017-02-25 ENCOUNTER — Other Ambulatory Visit: Payer: Self-pay | Admitting: Family Medicine

## 2017-02-25 ENCOUNTER — Inpatient Hospital Stay: Payer: Medicare Other

## 2017-02-25 ENCOUNTER — Telehealth: Payer: Self-pay | Admitting: *Deleted

## 2017-02-25 ENCOUNTER — Other Ambulatory Visit: Payer: Self-pay | Admitting: Oncology

## 2017-02-25 ENCOUNTER — Other Ambulatory Visit: Payer: Self-pay

## 2017-02-25 ENCOUNTER — Inpatient Hospital Stay: Payer: Medicare Other | Attending: Oncology | Admitting: Oncology

## 2017-02-25 ENCOUNTER — Encounter: Payer: Self-pay | Admitting: Oncology

## 2017-02-25 VITALS — BP 155/74 | HR 71 | Temp 95.2°F | Resp 20 | Wt 178.0 lb

## 2017-02-25 DIAGNOSIS — C61 Malignant neoplasm of prostate: Secondary | ICD-10-CM

## 2017-02-25 DIAGNOSIS — J449 Chronic obstructive pulmonary disease, unspecified: Secondary | ICD-10-CM | POA: Insufficient documentation

## 2017-02-25 DIAGNOSIS — I700 Atherosclerosis of aorta: Secondary | ICD-10-CM | POA: Diagnosis not present

## 2017-02-25 DIAGNOSIS — Z79899 Other long term (current) drug therapy: Secondary | ICD-10-CM

## 2017-02-25 DIAGNOSIS — C8338 Diffuse large B-cell lymphoma, lymph nodes of multiple sites: Secondary | ICD-10-CM

## 2017-02-25 DIAGNOSIS — Z800 Family history of malignant neoplasm of digestive organs: Secondary | ICD-10-CM | POA: Insufficient documentation

## 2017-02-25 DIAGNOSIS — C7951 Secondary malignant neoplasm of bone: Principal | ICD-10-CM

## 2017-02-25 DIAGNOSIS — I517 Cardiomegaly: Secondary | ICD-10-CM | POA: Insufficient documentation

## 2017-02-25 DIAGNOSIS — K449 Diaphragmatic hernia without obstruction or gangrene: Secondary | ICD-10-CM | POA: Insufficient documentation

## 2017-02-25 DIAGNOSIS — D649 Anemia, unspecified: Secondary | ICD-10-CM | POA: Insufficient documentation

## 2017-02-25 DIAGNOSIS — R590 Localized enlarged lymph nodes: Secondary | ICD-10-CM | POA: Insufficient documentation

## 2017-02-25 DIAGNOSIS — R630 Anorexia: Secondary | ICD-10-CM | POA: Diagnosis not present

## 2017-02-25 DIAGNOSIS — M129 Arthropathy, unspecified: Secondary | ICD-10-CM | POA: Diagnosis not present

## 2017-02-25 DIAGNOSIS — M8588 Other specified disorders of bone density and structure, other site: Secondary | ICD-10-CM | POA: Diagnosis not present

## 2017-02-25 DIAGNOSIS — N400 Enlarged prostate without lower urinary tract symptoms: Secondary | ICD-10-CM | POA: Diagnosis not present

## 2017-02-25 DIAGNOSIS — I6522 Occlusion and stenosis of left carotid artery: Secondary | ICD-10-CM | POA: Diagnosis not present

## 2017-02-25 DIAGNOSIS — R251 Tremor, unspecified: Secondary | ICD-10-CM | POA: Diagnosis not present

## 2017-02-25 LAB — CBC WITH DIFFERENTIAL/PLATELET
BASOS ABS: 0 10*3/uL (ref 0–0.1)
Basophils Relative: 0 %
Eosinophils Absolute: 0 10*3/uL (ref 0–0.7)
Eosinophils Relative: 0 %
HEMATOCRIT: 37 % — AB (ref 40.0–52.0)
Hemoglobin: 12.1 g/dL — ABNORMAL LOW (ref 13.0–18.0)
LYMPHS ABS: 0.7 10*3/uL — AB (ref 1.0–3.6)
LYMPHS PCT: 8 %
MCH: 30.1 pg (ref 26.0–34.0)
MCHC: 32.8 g/dL (ref 32.0–36.0)
MCV: 91.9 fL (ref 80.0–100.0)
MONO ABS: 0.3 10*3/uL (ref 0.2–1.0)
Monocytes Relative: 4 %
NEUTROS ABS: 7.7 10*3/uL — AB (ref 1.4–6.5)
Neutrophils Relative %: 88 %
Platelets: 161 10*3/uL (ref 150–440)
RBC: 4.03 MIL/uL — ABNORMAL LOW (ref 4.40–5.90)
RDW: 16.8 % — AB (ref 11.5–14.5)
WBC: 8.8 10*3/uL (ref 3.8–10.6)

## 2017-02-25 LAB — COMPREHENSIVE METABOLIC PANEL
ALBUMIN: 3.4 g/dL — AB (ref 3.5–5.0)
ALK PHOS: 50 U/L (ref 38–126)
ALT: 14 U/L — AB (ref 17–63)
AST: 25 U/L (ref 15–41)
Anion gap: 10 (ref 5–15)
BILIRUBIN TOTAL: 0.5 mg/dL (ref 0.3–1.2)
BUN: 28 mg/dL — AB (ref 6–20)
CO2: 24 mmol/L (ref 22–32)
CREATININE: 1.1 mg/dL (ref 0.61–1.24)
Calcium: 9 mg/dL (ref 8.9–10.3)
Chloride: 101 mmol/L (ref 101–111)
GFR calc Af Amer: >60 mL/min (ref >60)
GFR, EST NON AFRICAN AMERICAN: 55 mL/min — AB (ref >60)
GLUCOSE: 122 mg/dL — AB (ref 65–99)
Potassium: 4.5 mmol/L (ref 3.5–5.1)
Sodium: 135 mmol/L (ref 135–145)
TOTAL PROTEIN: 6.3 g/dL — AB (ref 6.5–8.1)

## 2017-02-25 LAB — PSA: Prostatic Specific Antigen: 39.32 ng/mL — ABNORMAL HIGH (ref 0.00–4.00)

## 2017-02-25 MED ORDER — PREDNISONE 10 MG PO TABS: 10.0000 mg | ORAL_TABLET | Freq: Every day | ORAL | 5 refills | Status: DC

## 2017-02-25 NOTE — Telephone Encounter (Signed)
Pt daughter called back states Dr Grayland Ormond has sent this to the pharmacy/MW

## 2017-02-25 NOTE — Telephone Encounter (Signed)
[facility] is requesting a Rx refill:  predniSONE (DELTASONE) 10 MG tablet   [facility] [address]  LN#[phone]/EY

## 2017-02-25 NOTE — Telephone Encounter (Signed)
Daughter called to report that prescription for prednisone is NOT at pharmacy

## 2017-02-25 NOTE — Progress Notes (Signed)
Patient denies any concerns today.  

## 2017-02-26 DIAGNOSIS — M545 Low back pain: Secondary | ICD-10-CM | POA: Diagnosis not present

## 2017-02-26 DIAGNOSIS — C61 Malignant neoplasm of prostate: Secondary | ICD-10-CM | POA: Diagnosis not present

## 2017-02-26 DIAGNOSIS — C7951 Secondary malignant neoplasm of bone: Secondary | ICD-10-CM | POA: Diagnosis not present

## 2017-02-26 DIAGNOSIS — C8330 Diffuse large B-cell lymphoma, unspecified site: Secondary | ICD-10-CM | POA: Diagnosis not present

## 2017-02-26 DIAGNOSIS — I10 Essential (primary) hypertension: Secondary | ICD-10-CM | POA: Diagnosis not present

## 2017-02-26 DIAGNOSIS — K219 Gastro-esophageal reflux disease without esophagitis: Secondary | ICD-10-CM | POA: Diagnosis not present

## 2017-02-26 DIAGNOSIS — M159 Polyosteoarthritis, unspecified: Secondary | ICD-10-CM | POA: Diagnosis not present

## 2017-02-26 DIAGNOSIS — J449 Chronic obstructive pulmonary disease, unspecified: Secondary | ICD-10-CM | POA: Diagnosis not present

## 2017-02-26 DIAGNOSIS — H9190 Unspecified hearing loss, unspecified ear: Secondary | ICD-10-CM | POA: Diagnosis not present

## 2017-02-26 DIAGNOSIS — M21372 Foot drop, left foot: Secondary | ICD-10-CM | POA: Diagnosis not present

## 2017-02-26 DIAGNOSIS — N400 Enlarged prostate without lower urinary tract symptoms: Secondary | ICD-10-CM | POA: Diagnosis not present

## 2017-02-26 DIAGNOSIS — G8929 Other chronic pain: Secondary | ICD-10-CM | POA: Diagnosis not present

## 2017-02-26 DIAGNOSIS — M40294 Other kyphosis, thoracic region: Secondary | ICD-10-CM | POA: Diagnosis not present

## 2017-02-27 ENCOUNTER — Telehealth: Payer: Self-pay | Admitting: *Deleted

## 2017-02-27 NOTE — Telephone Encounter (Signed)
Daughter called to report that patient had his prednisone dose reduced Wednesday and that he did not take any Prednisone yesterday or today and now he has pain and nausea, States that she thinks we reduced his dose too quickly and would like to discuss this with MD

## 2017-02-27 NOTE — Telephone Encounter (Signed)
If he didn't take any, how do we know if it is the correct dose?  Take as prescribed (10mg  daily) and if no improvement may increase to 20mg  daily.

## 2017-02-27 NOTE — Telephone Encounter (Signed)
I spoke with daughter who told me she had him take 20 mg this morning after she called me. I discussed with her the dose is to be 10 mg and if that does not work after several days to increase him to 20 mg. She repeated this back to me and said she will tell him and she will call me next week if they increase his dose to 20 mg

## 2017-03-04 DIAGNOSIS — M21372 Foot drop, left foot: Secondary | ICD-10-CM | POA: Diagnosis not present

## 2017-03-04 DIAGNOSIS — G8929 Other chronic pain: Secondary | ICD-10-CM | POA: Diagnosis not present

## 2017-03-04 DIAGNOSIS — M159 Polyosteoarthritis, unspecified: Secondary | ICD-10-CM | POA: Diagnosis not present

## 2017-03-04 DIAGNOSIS — C61 Malignant neoplasm of prostate: Secondary | ICD-10-CM | POA: Diagnosis not present

## 2017-03-04 DIAGNOSIS — C8330 Diffuse large B-cell lymphoma, unspecified site: Secondary | ICD-10-CM | POA: Diagnosis not present

## 2017-03-04 DIAGNOSIS — N400 Enlarged prostate without lower urinary tract symptoms: Secondary | ICD-10-CM | POA: Diagnosis not present

## 2017-03-04 DIAGNOSIS — I10 Essential (primary) hypertension: Secondary | ICD-10-CM | POA: Diagnosis not present

## 2017-03-04 DIAGNOSIS — M40294 Other kyphosis, thoracic region: Secondary | ICD-10-CM | POA: Diagnosis not present

## 2017-03-04 DIAGNOSIS — H9190 Unspecified hearing loss, unspecified ear: Secondary | ICD-10-CM | POA: Diagnosis not present

## 2017-03-04 DIAGNOSIS — K219 Gastro-esophageal reflux disease without esophagitis: Secondary | ICD-10-CM | POA: Diagnosis not present

## 2017-03-04 DIAGNOSIS — J449 Chronic obstructive pulmonary disease, unspecified: Secondary | ICD-10-CM | POA: Diagnosis not present

## 2017-03-04 DIAGNOSIS — C7951 Secondary malignant neoplasm of bone: Secondary | ICD-10-CM | POA: Diagnosis not present

## 2017-03-04 DIAGNOSIS — M545 Low back pain: Secondary | ICD-10-CM | POA: Diagnosis not present

## 2017-03-09 ENCOUNTER — Telehealth: Payer: Self-pay | Admitting: Family Medicine

## 2017-03-09 NOTE — Telephone Encounter (Signed)
Please advise-Micheil Klaus V Jarold Macomber, RMA  

## 2017-03-09 NOTE — Telephone Encounter (Signed)
Sabra with [facility] called to advise pt is requesting to discharge for physical therapy without making an appointment.  HA#[phone]

## 2017-03-10 ENCOUNTER — Other Ambulatory Visit: Payer: Self-pay

## 2017-03-10 ENCOUNTER — Ambulatory Visit: Payer: Self-pay | Admitting: Oncology

## 2017-03-10 NOTE — Telephone Encounter (Signed)
ok 

## 2017-03-10 NOTE — Telephone Encounter (Signed)
Advised  ED 

## 2017-03-12 ENCOUNTER — Other Ambulatory Visit: Payer: Self-pay | Admitting: *Deleted

## 2017-03-12 MED ORDER — PREDNISONE 20 MG PO TABS: 20.0000 mg | ORAL_TABLET | Freq: Every day | ORAL | 0 refills | Status: DC

## 2017-03-12 NOTE — Telephone Encounter (Signed)
  Steven Greene patients daughter called to report.  Patient has been taking 20 mg of prednisone a day instead for the prescribed 10mg . The 10 mg did not work according to patient. He is running out of pills, pharmacy will not refill. She would like to talk with someone at the office to discuss the plan. [phone]

## 2017-03-12 NOTE — Addendum Note (Signed)
Addended by: Betti Cruz on: 03/12/2017 12:51 PM   Modules accepted: Orders

## 2017-03-12 NOTE — Telephone Encounter (Signed)
Error/MW °

## 2017-03-12 NOTE — Telephone Encounter (Signed)
Go ahead and refill 20mg  daily.

## 2017-03-13 ENCOUNTER — Other Ambulatory Visit: Payer: Self-pay | Admitting: *Deleted

## 2017-03-13 NOTE — Telephone Encounter (Signed)
This was filled yesterday

## 2017-04-01 ENCOUNTER — Other Ambulatory Visit: Payer: Self-pay | Admitting: Oncology

## 2017-04-10 ENCOUNTER — Encounter: Payer: Self-pay | Admitting: Family Medicine

## 2017-04-12 ENCOUNTER — Encounter: Payer: Self-pay | Admitting: Family Medicine

## 2017-04-12 ENCOUNTER — Other Ambulatory Visit: Payer: Self-pay

## 2017-04-12 ENCOUNTER — Inpatient Hospital Stay
Admission: EM | Admit: 2017-04-12 | Discharge: 2017-04-24 | DRG: 308 | Disposition: E | Payer: Medicare Other | Attending: Internal Medicine | Admitting: Internal Medicine

## 2017-04-12 ENCOUNTER — Emergency Department: Payer: Medicare Other

## 2017-04-12 ENCOUNTER — Encounter: Payer: Self-pay | Admitting: Emergency Medicine

## 2017-04-12 DIAGNOSIS — E43 Unspecified severe protein-calorie malnutrition: Secondary | ICD-10-CM | POA: Diagnosis present

## 2017-04-12 DIAGNOSIS — Z6826 Body mass index (BMI) 26.0-26.9, adult: Secondary | ICD-10-CM

## 2017-04-12 DIAGNOSIS — Z800 Family history of malignant neoplasm of digestive organs: Secondary | ICD-10-CM

## 2017-04-12 DIAGNOSIS — D649 Anemia, unspecified: Secondary | ICD-10-CM | POA: Diagnosis not present

## 2017-04-12 DIAGNOSIS — J9811 Atelectasis: Secondary | ICD-10-CM | POA: Diagnosis not present

## 2017-04-12 DIAGNOSIS — Z7951 Long term (current) use of inhaled steroids: Secondary | ICD-10-CM

## 2017-04-12 DIAGNOSIS — I480 Paroxysmal atrial fibrillation: Secondary | ICD-10-CM | POA: Diagnosis not present

## 2017-04-12 DIAGNOSIS — C61 Malignant neoplasm of prostate: Secondary | ICD-10-CM | POA: Diagnosis present

## 2017-04-12 DIAGNOSIS — Z66 Do not resuscitate: Secondary | ICD-10-CM | POA: Diagnosis not present

## 2017-04-12 DIAGNOSIS — Z515 Encounter for palliative care: Secondary | ICD-10-CM | POA: Diagnosis not present

## 2017-04-12 DIAGNOSIS — I1310 Hypertensive heart and chronic kidney disease without heart failure, with stage 1 through stage 4 chronic kidney disease, or unspecified chronic kidney disease: Secondary | ICD-10-CM | POA: Diagnosis not present

## 2017-04-12 DIAGNOSIS — G8929 Other chronic pain: Secondary | ICD-10-CM | POA: Diagnosis not present

## 2017-04-12 DIAGNOSIS — N183 Chronic kidney disease, stage 3 (moderate): Secondary | ICD-10-CM | POA: Diagnosis present

## 2017-04-12 DIAGNOSIS — I4891 Unspecified atrial fibrillation: Secondary | ICD-10-CM | POA: Diagnosis not present

## 2017-04-12 DIAGNOSIS — D696 Thrombocytopenia, unspecified: Secondary | ICD-10-CM | POA: Diagnosis not present

## 2017-04-12 DIAGNOSIS — R778 Other specified abnormalities of plasma proteins: Secondary | ICD-10-CM

## 2017-04-12 DIAGNOSIS — R600 Localized edema: Secondary | ICD-10-CM | POA: Diagnosis present

## 2017-04-12 DIAGNOSIS — R748 Abnormal levels of other serum enzymes: Secondary | ICD-10-CM | POA: Diagnosis not present

## 2017-04-12 DIAGNOSIS — C8338 Diffuse large B-cell lymphoma, lymph nodes of multiple sites: Secondary | ICD-10-CM | POA: Diagnosis not present

## 2017-04-12 DIAGNOSIS — C8330 Diffuse large B-cell lymphoma, unspecified site: Secondary | ICD-10-CM | POA: Diagnosis not present

## 2017-04-12 DIAGNOSIS — I959 Hypotension, unspecified: Secondary | ICD-10-CM | POA: Diagnosis present

## 2017-04-12 DIAGNOSIS — Z8249 Family history of ischemic heart disease and other diseases of the circulatory system: Secondary | ICD-10-CM | POA: Diagnosis not present

## 2017-04-12 DIAGNOSIS — Z7952 Long term (current) use of systemic steroids: Secondary | ICD-10-CM

## 2017-04-12 DIAGNOSIS — J449 Chronic obstructive pulmonary disease, unspecified: Secondary | ICD-10-CM | POA: Diagnosis present

## 2017-04-12 DIAGNOSIS — Z7189 Other specified counseling: Secondary | ICD-10-CM | POA: Diagnosis not present

## 2017-04-12 DIAGNOSIS — E860 Dehydration: Secondary | ICD-10-CM | POA: Diagnosis not present

## 2017-04-12 DIAGNOSIS — N179 Acute kidney failure, unspecified: Secondary | ICD-10-CM | POA: Diagnosis present

## 2017-04-12 DIAGNOSIS — Z96641 Presence of right artificial hip joint: Secondary | ICD-10-CM | POA: Diagnosis present

## 2017-04-12 DIAGNOSIS — Z9221 Personal history of antineoplastic chemotherapy: Secondary | ICD-10-CM | POA: Diagnosis not present

## 2017-04-12 DIAGNOSIS — I351 Nonrheumatic aortic (valve) insufficiency: Secondary | ICD-10-CM | POA: Diagnosis not present

## 2017-04-12 DIAGNOSIS — C7951 Secondary malignant neoplasm of bone: Secondary | ICD-10-CM | POA: Diagnosis present

## 2017-04-12 DIAGNOSIS — M79606 Pain in leg, unspecified: Secondary | ICD-10-CM | POA: Diagnosis not present

## 2017-04-12 DIAGNOSIS — R531 Weakness: Secondary | ICD-10-CM | POA: Diagnosis not present

## 2017-04-12 DIAGNOSIS — R7989 Other specified abnormal findings of blood chemistry: Secondary | ICD-10-CM

## 2017-04-12 DIAGNOSIS — C8510 Unspecified B-cell lymphoma, unspecified site: Secondary | ICD-10-CM | POA: Diagnosis not present

## 2017-04-12 DIAGNOSIS — R404 Transient alteration of awareness: Secondary | ICD-10-CM | POA: Diagnosis not present

## 2017-04-12 LAB — URINALYSIS, COMPLETE (UACMP) WITH MICROSCOPIC
BILIRUBIN URINE: NEGATIVE
GLUCOSE, UA: NEGATIVE mg/dL
Ketones, ur: NEGATIVE mg/dL
LEUKOCYTES UA: NEGATIVE
NITRITE: NEGATIVE
PH: 5 (ref 5.0–8.0)
Protein, ur: NEGATIVE mg/dL
SPECIFIC GRAVITY, URINE: 1.009 (ref 1.005–1.030)
Squamous Epithelial / LPF: NONE SEEN

## 2017-04-12 LAB — PLATELET COUNT: PLATELETS: 10 10*3/uL — AB (ref 150–440)

## 2017-04-12 LAB — COMPREHENSIVE METABOLIC PANEL
ALK PHOS: 61 U/L (ref 38–126)
ALT: 21 U/L (ref 17–63)
ANION GAP: 13 (ref 5–15)
AST: 38 U/L (ref 15–41)
Albumin: 3.3 g/dL — ABNORMAL LOW (ref 3.5–5.0)
BUN: 48 mg/dL — ABNORMAL HIGH (ref 6–20)
CALCIUM: 9.2 mg/dL (ref 8.9–10.3)
CO2: 20 mmol/L — ABNORMAL LOW (ref 22–32)
CREATININE: 1.72 mg/dL — AB (ref 0.61–1.24)
Chloride: 103 mmol/L (ref 101–111)
GFR, EST AFRICAN AMERICAN: 37 mL/min — AB (ref >60)
GFR, EST NON AFRICAN AMERICAN: 32 mL/min — AB (ref >60)
Glucose, Bld: 145 mg/dL — ABNORMAL HIGH (ref 65–99)
Potassium: 5.1 mmol/L (ref 3.5–5.1)
SODIUM: 136 mmol/L (ref 135–145)
TOTAL PROTEIN: 6.1 g/dL — AB (ref 6.5–8.1)
Total Bilirubin: 1.2 mg/dL (ref 0.3–1.2)

## 2017-04-12 LAB — BRAIN NATRIURETIC PEPTIDE: B NATRIURETIC PEPTIDE 5: 1448 pg/mL — AB (ref 0.0–100.0)

## 2017-04-12 LAB — TYPE AND SCREEN
ABO/RH(D): A POS
ANTIBODY SCREEN: NEGATIVE

## 2017-04-12 LAB — TROPONIN I
TROPONIN I: 0.09 ng/mL — AB (ref <0.03)
Troponin I: 0.34 ng/mL (ref <0.03)
Troponin I: 0.11 ng/mL (ref <0.03)

## 2017-04-12 LAB — CBC
HCT: 34.5 % — ABNORMAL LOW (ref 40.0–52.0)
HEMOGLOBIN: 11 g/dL — AB (ref 13.0–18.0)
MCH: 29.2 pg (ref 26.0–34.0)
MCHC: 32 g/dL (ref 32.0–36.0)
MCV: 91.1 fL (ref 80.0–100.0)
PLATELETS: 9 10*3/uL — AB (ref 150–440)
RBC: 3.79 MIL/uL — AB (ref 4.40–5.90)
RDW: 17.3 % — ABNORMAL HIGH (ref 11.5–14.5)
WBC: 9.9 10*3/uL (ref 3.8–10.6)

## 2017-04-12 LAB — LIPASE, BLOOD: Lipase: 52 U/L — ABNORMAL HIGH (ref 11–51)

## 2017-04-12 LAB — LACTIC ACID, PLASMA
Lactic Acid, Venous: 2.9 mmol/L (ref 0.5–1.9)
Lactic Acid, Venous: 2.5 mmol/L (ref 0.5–1.9)

## 2017-04-12 LAB — TSH: TSH: 0.708 u[IU]/mL (ref 0.350–4.500)

## 2017-04-12 MED ORDER — BISACODYL 10 MG RE SUPP: 10.0000 mg | Freq: Every day | RECTAL | Status: DC | PRN

## 2017-04-12 MED ORDER — IPRATROPIUM-ALBUTEROL 0.5-2.5 (3) MG/3ML IN SOLN
3.0000 mL | Freq: Four times a day (QID) | RESPIRATORY_TRACT | Status: DC
  Administered 2017-04-12 – 2017-04-13 (×4): 3 mL via RESPIRATORY_TRACT
  Filled 2017-04-12 (×4): qty 3

## 2017-04-12 MED ORDER — SODIUM CHLORIDE 0.9% FLUSH: 3.0000 mL | INTRAVENOUS | Status: DC | PRN

## 2017-04-12 MED ORDER — DILTIAZEM HCL 25 MG/5ML IV SOLN
10.0000 mg | Freq: Once | INTRAVENOUS | Status: AC
  Administered 2017-04-12: 10 mg via INTRAVENOUS
  Filled 2017-04-12: qty 5

## 2017-04-12 MED ORDER — SODIUM CHLORIDE 0.9 % IV SOLN: 250.0000 mL | INTRAVENOUS | Status: DC | PRN

## 2017-04-12 MED ORDER — SODIUM CHLORIDE 0.9 % IV SOLN
Freq: Once | INTRAVENOUS | Status: AC
  Administered 2017-04-12: 17:00:00 via INTRAVENOUS

## 2017-04-12 MED ORDER — SODIUM CHLORIDE 0.9 % IV BOLUS (SEPSIS)
1000.0000 mL | Freq: Once | INTRAVENOUS | Status: AC
  Administered 2017-04-12: 1000 mL via INTRAVENOUS

## 2017-04-12 MED ORDER — MOMETASONE FURO-FORMOTEROL FUM 100-5 MCG/ACT IN AERO
2.0000 | INHALATION_SPRAY | Freq: Two times a day (BID) | RESPIRATORY_TRACT | Status: DC
  Administered 2017-04-12 – 2017-04-13 (×2): 2 via RESPIRATORY_TRACT
  Filled 2017-04-12: qty 8.8

## 2017-04-12 MED ORDER — ACETAMINOPHEN 650 MG RE SUPP: 650.0000 mg | Freq: Four times a day (QID) | RECTAL | Status: DC | PRN

## 2017-04-12 MED ORDER — FUROSEMIDE 10 MG/ML IJ SOLN
40.0000 mg | Freq: Once | INTRAMUSCULAR | Status: AC
  Administered 2017-04-12: 40 mg via INTRAVENOUS
  Filled 2017-04-12: qty 4

## 2017-04-12 MED ORDER — VANCOMYCIN HCL IN DEXTROSE 1-5 GM/200ML-% IV SOLN
1000.0000 mg | INTRAVENOUS | Status: DC
  Administered 2017-04-12: 1000 mg via INTRAVENOUS
  Filled 2017-04-12 (×3): qty 200

## 2017-04-12 MED ORDER — ONDANSETRON HCL 4 MG PO TABS: 4.0000 mg | ORAL_TABLET | Freq: Four times a day (QID) | ORAL | Status: DC | PRN

## 2017-04-12 MED ORDER — ONDANSETRON HCL 4 MG/2ML IJ SOLN: 4.0000 mg | Freq: Four times a day (QID) | INTRAMUSCULAR | Status: DC | PRN

## 2017-04-12 MED ORDER — DILTIAZEM HCL 30 MG PO TABS
30.0000 mg | ORAL_TABLET | Freq: Four times a day (QID) | ORAL | Status: DC
  Administered 2017-04-12 – 2017-04-13 (×4): 30 mg via ORAL
  Filled 2017-04-12 (×4): qty 1

## 2017-04-12 MED ORDER — ACETAMINOPHEN 325 MG PO TABS
650.0000 mg | ORAL_TABLET | Freq: Four times a day (QID) | ORAL | Status: DC | PRN
  Administered 2017-04-13: 650 mg via ORAL
  Filled 2017-04-12: qty 2

## 2017-04-12 MED ORDER — PIPERACILLIN-TAZOBACTAM 3.375 G IVPB
3.3750 g | Freq: Three times a day (TID) | INTRAVENOUS | Status: DC
  Administered 2017-04-12 – 2017-04-13 (×2): 3.375 g via INTRAVENOUS
  Filled 2017-04-12 (×2): qty 50

## 2017-04-12 MED ORDER — DOCUSATE SODIUM 100 MG PO CAPS
100.0000 mg | ORAL_CAPSULE | Freq: Two times a day (BID) | ORAL | Status: DC
  Administered 2017-04-12 – 2017-04-13 (×3): 100 mg via ORAL
  Filled 2017-04-12 (×4): qty 1

## 2017-04-12 MED ORDER — PANTOPRAZOLE SODIUM 40 MG IV SOLR
40.0000 mg | Freq: Two times a day (BID) | INTRAVENOUS | Status: DC
  Administered 2017-04-12 – 2017-04-14 (×5): 40 mg via INTRAVENOUS
  Filled 2017-04-12 (×5): qty 40

## 2017-04-12 MED ORDER — PREDNISONE 20 MG PO TABS
20.0000 mg | ORAL_TABLET | Freq: Every day | ORAL | Status: DC
  Administered 2017-04-13: 20 mg via ORAL
  Filled 2017-04-12: qty 1

## 2017-04-12 MED ORDER — VANCOMYCIN HCL IN DEXTROSE 1-5 GM/200ML-% IV SOLN
1000.0000 mg | Freq: Once | INTRAVENOUS | Status: AC
  Administered 2017-04-12: 1000 mg via INTRAVENOUS
  Filled 2017-04-12: qty 200

## 2017-04-12 MED ORDER — DIGOXIN 0.25 MG/ML IJ SOLN
0.1250 mg | Freq: Once | INTRAMUSCULAR | Status: AC
  Administered 2017-04-12: 0.125 mg via INTRAVENOUS
  Filled 2017-04-12 (×3): qty 0.5

## 2017-04-12 MED ORDER — SODIUM CHLORIDE 0.9% FLUSH
3.0000 mL | Freq: Two times a day (BID) | INTRAVENOUS | Status: DC
  Administered 2017-04-12 – 2017-04-13 (×2): 3 mL via INTRAVENOUS

## 2017-04-12 MED ORDER — PIPERACILLIN-TAZOBACTAM 3.375 G IVPB 30 MIN
3.3750 g | Freq: Once | INTRAVENOUS | Status: AC
  Administered 2017-04-12: 3.375 g via INTRAVENOUS
  Filled 2017-04-12: qty 50

## 2017-04-12 NOTE — H&P (Signed)
History and Physical    Steven Greene:[id number] DOB: Jan 14, 1920 DOA: 04/06/2017  Referring physician: Dr. Reita Cliche PCP: Jerrol Banana., MD  Specialists: none  Chief Complaint: weakness  HPI: Steven Greene is a 84 y.o. male has a past medical history significant for COPD/asthma and b-cell lymphoma now with 5 day hx of weakness with N/V, SOB, and LE edema. In ER, pt noted to be in rapid a-fib with relative hypotension and platelet count=9K. Also noted to have LE edema although he is not hypoxic and CXR stable. BNP and troponin elevated. Also noted to have ARF by labs. He has not been eating well. Denies fever or CP. No diarrhea. He is now admitted. Lives alone. Discussed code status and he wants DNR in place. Family agrees.  Review of Systems: The patient denies  fever, weight loss,, vision loss, decreased hearing, hoarseness, chest pain, syncope,  balance deficits, hemoptysis, abdominal pain, melena, hematochezia, severe indigestion/heartburn, hematuria, incontinence, genital sores, muscle weakness, suspicious skin lesions, transient blindness, difficulty walking, depression, unusual weight change, abnormal bleeding, enlarged lymph nodes, angioedema, and breast masses.   Past Medical History:  Diagnosis Date  . Arthritis   . Asthma   . Cancer [facility])    prostate cancer-treated medically  . COPD (chronic obstructive pulmonary disease) ([place])   . Foot drop, left   . Hernia, inguinal, left   . HOH (hard of hearing)   . Tremors of nervous system    Past Surgical History:  Procedure Laterality Date  . ANKLE FRACTURE SURGERY  2009   lt  . ANKLE FRACTURE SURGERY     rt  . EYE SURGERY     both cataracts  . HEMORRHOID SURGERY    . JOINT REPLACEMENT  2000   rt hip replacement  . WRIST FRACTURE SURGERY Bilateral    Social History:  reports that  has never smoked. he has never used smokeless tobacco. He reports that he does not drink alcohol or use drugs.  No Known  Allergies  Family History  Problem Relation Age of Onset  . Heart attack Father   . Tremor Father   . Cancer Sister   . Stomach cancer Brother   . Heart attack Sister   . Heart attack Sister     Prior to Admission medications   Medication Sig Start Date End Date Taking? Authorizing Provider  acetaminophen (TYLENOL) 500 MG tablet Take 500 mg by mouth every 6 (six) hours as needed.   Yes [provider]  Fluticasone-Salmeterol (ADVAIR DISKUS) 100-50 MCG/DOSE AEPB Inhale 1 puff into the lungs 2 (two) times daily. Patient taking differently: Inhale 1 puff into the lungs daily.  12/01/16  Yes Jerrol Banana., MD  pantoprazole (PROTONIX) 40 MG tablet Take 40 mg by mouth daily.   Yes [provider]  predniSONE (DELTASONE) 20 MG tablet TAKE 1 TABLET BY MOUTH ONCE DAILY WITH  BREAKFAST 04/01/17  Yes Lloyd Huger, MD  propranolol (INDERAL) 20 MG tablet TAKE ONE TABLET BY MOUTH ONCE DAILY 09/01/16  Yes Jerrol Banana., MD  ranitidine (ZANTAC) 150 MG tablet Take 1 tablet (150 mg total) by mouth 2 (two) times daily. Patient not taking: Reported on 04/08/2017 01/20/17   Jerrol Banana., MD   Physical Exam: Vitals:   04/13/2017 1234 04/01/2017 1300 04/06/2017 1320 04/17/2017 1330  BP: 92/78 (!) 88/58 106/73 105/69  Pulse: (!) 127 77  (!) 118  Resp: (!) 24 (!) 23 (!) 25 Marland Kitchen)  33  Temp:      TempSrc:      SpO2: 95% 95%  95%     General:  WDWN, [place]/AT, in moderate distress  Eyes: PERRL, EOMI, no scleral icterus, conjunctiva clear  ENT: moist oropharynx without exudate, TM's benign, dentition fair  Neck: supple, no lymphadenopathy. No bruits or thyromegaly  Cardiovascular: irregularly irregular with rapid rate without MRG; 2+ peripheral pulses, no JVD, 2+ peripheral edema  Respiratory: basilar rales without wheezes or rhonchi. No dullness. Respiratory effort normal  Abdomen: soft, non tender to palpation, positive bowel sounds, no guarding, no  rebound  Skin: no rashes or lesions  Musculoskeletal: normal bulk and tone, no joint swelling  Psychiatric: normal mood and affect, A&OX3  Neurologic: CN 2-12 grossly intact, Motor strength 5/5 in all 4 groups with symmetric DTR's and non-focal sensory exam.  Labs on Admission:  Basic Metabolic Panel: Recent Labs  Lab 04/20/2017 1043  NA 136  K 5.1  CL 103  CO2 20*  GLUCOSE 145*  BUN 48*  CREATININE 1.72*  CALCIUM 9.2   Liver Function Tests: Recent Labs  Lab 03/24/2017 1043  AST 38  ALT 21  ALKPHOS 61  BILITOT 1.2  PROT 6.1*  ALBUMIN 3.3*   Recent Labs  Lab 04/22/2017 1043  LIPASE 52*   No results for input(s): AMMONIA in the last 168 hours. CBC: Recent Labs  Lab 04/08/2017 1043  WBC 9.9  HGB 11.0*  HCT 34.5*  MCV 91.1  PLT 9*   Cardiac Enzymes: Recent Labs  Lab 03/25/2017 1043  TROPONINI 0.34*    BNP (last 3 results) Recent Labs    04/14/2017 1043  BNP 1,448.0*    ProBNP (last 3 results) No results for input(s): PROBNP in the last 8760 hours.  CBG: No results for input(s): GLUCAP in the last 168 hours.  Radiological Exams on Admission: Dg Chest Port 1 View  Result Date: 04/06/2017 CLINICAL DATA:  Atrial fibrillation. History of prostate cancer and B-cell lymphoma. EXAM: PORTABLE CHEST 1 VIEW COMPARISON:  Chest radiograph April 12, 2015; PET-CT February 23, 2017 FINDINGS: There is atelectatic change in the right base. There is no edema or consolidation. Heart is mildly enlarged with pulmonary vascularity normal. No adenopathy is evident by radiography. There is aortic atherosclerosis. There is degenerative change in each shoulder. IMPRESSION: Aortic atherosclerosis. Atelectasis right base. No edema or consolidation. Heart mildly enlarged. Aortic Atherosclerosis (ICD10-I70.0). Electronically Signed   By: Lowella Grip III M.D.   On: 03/27/2017 11:56    EKG: Independently reviewed.  Assessment/Plan Principal Problem:   Atrial fibrillation with  RVR (HCC) Active Problems:   DLBCL (diffuse large B cell lymphoma) (HCC)   Thrombocytopenia (HCC)   ARF (acute renal failure) ([place])   Will admit to floor as DNR. IV Lasix x 1. Begin low dose po Cardizem. Repeat platelet count STAT and transfuse if accurate. Consult Cardiology, Oncology, Nephrology, and Palliative Care. Echo ordered. Follow enzymes. Empiric IV ABX started for possible sepsis given hypotension. Cultures sent. Repeat labs in AM  Diet: clear liquids Fluids: saline lock DVT Prophylaxis: TED hose  Code Status: DNR  Family Communication: yes  Disposition Plan: TBD  Time spent: 50 min

## 2017-04-12 NOTE — ED Triage Notes (Signed)
Pt to ED via [facility] from home for weakness, N/V since Wednesday. Per EMS pt was having difficulty transferring from wheelchair. Pt was tachycardic with EMS. Pt was given 250 cc of fluids with EMS and Zofran 4 mg. Pt denies pain at this time

## 2017-04-12 NOTE — ED Notes (Signed)
Portable XR at bedside

## 2017-04-12 NOTE — Progress Notes (Signed)
CODE SEPSIS - PHARMACY COMMUNICATION  **Broad Spectrum Antibiotics should be administered within 1 hour of Sepsis diagnosis**  Time Code Sepsis Called/Page Received: 13:43  Antibiotics Ordered: Zosyn and Vancomycin  Time of 1st antibiotic administration: 13:19  Additional action taken by pharmacy: n/a  If necessary, Name of Provider/Nurse Contacted: none     Symon,Shandrea Lusk D ,PharmD Clinical Pharmacist  04/09/2017  1:43 PM

## 2017-04-12 NOTE — Clinical Social Work Note (Signed)
CSW received consult for home health services. RNCM has a current consult and manages that role in patient care. CSW is signing off. Please consult should needs arise.  Santiago Bumpers, MSW, Latanya Presser [phone]

## 2017-04-12 NOTE — Progress Notes (Signed)
ANTIBIOTIC CONSULT NOTE - INITIAL  Pharmacy Consult for Vancomycin, Zosyn  Indication: sepsis  No Known Allergies  Patient Measurements: Height: 5\' 11"  (180.3 cm) Weight: 178 lb (80.7 kg) IBW/kg (Calculated) : 75.3 Adjusted Body Weight: 77.5 kg    Vital Signs: Temp: 98 F (36.7 C) (01/20 1045) Temp Source: Oral (01/20 1045) BP: 109/80 (01/20 1430) Pulse Rate: 115 (01/20 1429) Intake/Output from previous day: No intake/output data recorded. Intake/Output from this shift: Total I/O In: 2050 [IV Piggyback:2050] Out: -   Labs: Recent Labs    04/06/2017 1043  WBC 9.9  HGB 11.0*  PLT 9*  CREATININE 1.72*   Estimated Creatinine Clearance: 27.4 mL/min (A) (by C-G formula based on SCr of 1.72 mg/dL (H)). No results for input(s): VANCOTROUGH, VANCOPEAK, VANCORANDOM, GENTTROUGH, GENTPEAK, GENTRANDOM, TOBRATROUGH, TOBRAPEAK, TOBRARND, AMIKACINPEAK, AMIKACINTROU, AMIKACIN in the last 72 hours.   Microbiology: No results found for this or any previous visit (from the past 720 hour(s)).  Medical History: Past Medical History:  Diagnosis Date  . Arthritis   . Asthma   . Cancer [facility])    prostate cancer-treated medically  . COPD (chronic obstructive pulmonary disease) ([place])   . Foot drop, left   . Hernia, inguinal, left   . HOH (hard of hearing)   . Tremors of nervous system     Medications:   (Not in a hospital admission) Assessment: CrCl = 27.4 ml/min Ke = 0.028 hr-1 T1/2 = 24.8 hrs Vd = 56.5 L   Goal of Therapy:  Vancomycin trough level 15-20 mcg/ml  Plan:  Expected duration 7 days with resolution of temperature and/or normalization of WBC   Vancomycin 1 gm IV X 1 given on 1/20 @ 14:00. Vancomycin 1 gm IV Q24H ordered to start on 1/20 @ 20:00, ~ 6 hrs after 1st dose (stacked dosing). This pt will reach Css by 1/25 @ 14:00. Will draw 1st trough on 1/24 @ 19:30, which will be approaching Css.   Zosyn 3.375 gm IV Q8H EI to start on 19:00.   Steven Greene  D 03/26/2017,2:45 PM

## 2017-04-12 NOTE — ED Notes (Signed)
Pt unable to void at this time. Aware that we need urine specimen.

## 2017-04-12 NOTE — ED Provider Notes (Signed)
[facility] Emergency Department Provider Note ____________________________________________   I have reviewed the triage vital signs and the triage nursing note.  HISTORY  Chief Complaint Weakness and Emesis   Historian Level 5 Caveat History Limited by patient is a poor historian Patient's daughter and neighbor provide additional history  HPI Steven Greene is a 84 y.o. male who lives alone but is checked on frequently by close neighbor as well as his daughter, has not been eating or drinking well it sounds like probably over a week or 2.  Patient has also been quite nauseated over this period of time, somewhat worse over the past 2 days.  Patient states he may have had some mild vomiting.  Denies diarrhea.  He is been just extremely weak, been having trouble walking due to his legs "giving out.  "  They have been swollen, the family is not quite sure if they are worse or typical.  No known history of cardiac arrhythmia or A. Fib.  Daughter states patient followed by cancer center for aggressive B-cell lymphoma, patient has actually survive longer than they thought he would and has been doing relatively well.  Last chemo was in October.  They were going to discuss hospice services tomorrow.   Past Medical History:  Diagnosis Date  . Arthritis   . Asthma   . Cancer [facility])    prostate cancer-treated medically  . COPD (chronic obstructive pulmonary disease) ([place])   . Foot drop, left   . Hernia, inguinal, left   . HOH (hard of hearing)   . Tremors of nervous system     Patient Active Problem List   Diagnosis Date Noted  . DLBCL (diffuse large B cell lymphoma) ([place]) 04/20/2016  . Hypertension 12/08/2014  . Chronic obstructive airway disease ([place]) 12/08/2014  . Asthma 12/08/2014  . GERD (gastroesophageal reflux disease) 12/08/2014  . Osteoarthritis 12/08/2014  . Chronic back pain 12/08/2014  . BPH (benign prostatic hypertrophy) 12/08/2014  . Tremor  12/08/2014  . Prostate cancer metastatic to bone [facility]) 08/31/2014    Past Surgical History:  Procedure Laterality Date  . ANKLE FRACTURE SURGERY  2009   lt  . ANKLE FRACTURE SURGERY     rt  . EYE SURGERY     both cataracts  . HEMORRHOID SURGERY    . JOINT REPLACEMENT  2000   rt hip replacement  . WRIST FRACTURE SURGERY Bilateral     Prior to Admission medications   Medication Sig Start Date End Date Taking? Authorizing Provider  acetaminophen (TYLENOL) 500 MG tablet Take 500 mg by mouth every 6 (six) hours as needed.    [provider]  Fluticasone-Salmeterol (ADVAIR DISKUS) 100-50 MCG/DOSE AEPB Inhale 1 puff into the lungs 2 (two) times daily. Patient taking differently: Inhale 1 puff into the lungs daily.  12/01/16   Jerrol Banana., MD  predniSONE (DELTASONE) 20 MG tablet TAKE 1 TABLET BY MOUTH ONCE DAILY WITH  BREAKFAST 04/01/17   Lloyd Huger, MD  propranolol (INDERAL) 20 MG tablet TAKE ONE TABLET BY MOUTH ONCE DAILY 09/01/16   Jerrol Banana., MD  ranitidine (ZANTAC) 150 MG tablet Take 1 tablet (150 mg total) by mouth 2 (two) times daily. 01/20/17   Jerrol Banana., MD    No Known Allergies  Family History  Problem Relation Age of Onset  . Heart attack Father   . Tremor Father   . Cancer Sister   . Stomach cancer Brother   .  Heart attack Sister   . Heart attack Sister     Social History Social History   Tobacco Use  . Smoking status: Never Smoker  . Smokeless tobacco: Never Used  Substance Use Topics  . Alcohol use: No  . Drug use: No    Review of Systems  Constitutional: Negative for fever. Eyes: Negative for visual changes. ENT: Negative for sore throat. Cardiovascular: Negative for chest pain. Respiratory: Negative for shortness of breath. Gastrointestinal: Negative for abdominal pain, or diarrhea. Genitourinary: Negative for dysuria. Musculoskeletal: Negative for back pain. Skin: Negative for rash. Neurological:  Negative for headache.  ____________________________________________   PHYSICAL EXAM:  VITAL SIGNS: ED Triage Vitals [04/21/2017 1045]  Enc Vitals Group     BP 104/81     Pulse Rate (!) 135     Resp (!) 24     Temp 98 F (36.7 C)     Temp Source Oral     SpO2 95 %     Weight      Height      Head Circumference      Peak Flow      Pain Score      Pain Loc      Pain Edu?      Excl. in [place]?      Constitutional: Alert and cooperative, somewhat poor historian.  In no acute distress. HEENT   Head: Normocephalic and atraumatic.      Eyes: Conjunctivae are normal. Pupils equal and round.       Ears:         Nose: No congestion/rhinnorhea.   Mouth/Throat: Mucous membranes are mildly dry.   Neck: No stridor. Cardiovascular/Chest: Tachycardic and irregularly irregular.  No murmurs, rubs, or gallops. Respiratory: Normal respiratory effort without tachypnea nor retractions. Breath sounds are clear and equal bilaterally. No wheezes/rales/rhonchi. Gastrointestinal: Soft. No distention, no guarding, no rebound. Nontender.  Genitourinary/rectal:Deferred Musculoskeletal: Nontender with normal range of motion in all extremities.  No lower extreme tenderness.  He does have 4+ pitting edema bilateral lower extremities into the feet.  He has chronic skin changes without warmth.  Strong dorsal pedis pulse. Neurologic:  Normal speech and language. No gross or focal neurologic deficits are appreciated. Skin:  Skin is warm, dry and intact.  Chronic skin discoloration to the lower legs/ankles both sides. Psychiatric: Mood and affect are normal. Speech and behavior are normal. Patient exhibits appropriate insight and judgment.   ____________________________________________  LABS (pertinent positives/negatives) I, Lisa Roca, MD the attending physician have reviewed the labs noted below.  Labs Reviewed  LIPASE, BLOOD - Abnormal; Notable for the following components:      Result Value    Lipase 52 (*)    All other components within normal limits  COMPREHENSIVE METABOLIC PANEL - Abnormal; Notable for the following components:   CO2 20 (*)    Glucose, Bld 145 (*)    BUN 48 (*)    Creatinine, Ser 1.72 (*)    Total Protein 6.1 (*)    Albumin 3.3 (*)    GFR calc non Af Amer 32 (*)    GFR calc Af Amer 37 (*)    All other components within normal limits  CBC - Abnormal; Notable for the following components:   RBC 3.79 (*)    Hemoglobin 11.0 (*)    HCT 34.5 (*)    RDW 17.3 (*)    Platelets 9 (*)    All other components within normal limits  TROPONIN I -  Abnormal; Notable for the following components:   Troponin I 0.34 (*)    All other components within normal limits  BRAIN NATRIURETIC PEPTIDE - Abnormal; Notable for the following components:   B Natriuretic Peptide 1,448.0 (*)    All other components within normal limits  LACTIC ACID, PLASMA - Abnormal; Notable for the following components:   Lactic Acid, Venous 2.9 (*)    All other components within normal limits  CULTURE, BLOOD (ROUTINE X 2)  CULTURE, BLOOD (ROUTINE X 2)  URINE CULTURE  URINALYSIS, COMPLETE (UACMP) WITH MICROSCOPIC  LACTIC ACID, PLASMA    ____________________________________________    EKG I, Lisa Roca, MD, the attending physician have personally viewed and interpreted all ECGs.  138 bpm.  Atrial fibrillation with rapid ventricular response.  Narrow QRS.  Normal axis.  Nonspecific ST and T wave ____________________________________________  RADIOLOGY All Xrays were viewed by me.  Imaging interpreted by Radiologist, and I, Lisa Roca, MD the attending physician have reviewed the radiologist interpretation noted below.  Chest x-ray portable:  IMPRESSION: Aortic atherosclerosis. Atelectasis right base. No edema or consolidation. Heart mildly enlarged.  Aortic Atherosclerosis (ICD10-I70.0). __________________________________________  PROCEDURES  Procedure(s) performed:  None  Critical Care performed: CRITICAL CARE Performed by: Lisa Roca   Total critical care time: 45 minutes  Critical care time was exclusive of separately billable procedures and treating other patients.  Critical care was necessary to treat or prevent imminent or life-threatening deterioration.  Critical care was time spent personally by me on the following activities: development of treatment plan with patient and/or surrogate as well as nursing, discussions with consultants, evaluation of patient's response to treatment, examination of patient, obtaining history from patient or surrogate, ordering and performing treatments and interventions, ordering and review of laboratory studies, ordering and review of radiographic studies, pulse oximetry and re-evaluation of patient's condition.    ____________________________________________  ED COURSE / ASSESSMENT AND PLAN  Pertinent labs & imaging results that were available during my care of the patient were reviewed by me and considered in my medical decision making (see chart for details).   Patient has new onset rapid A. fib, he is not having any difficulty breathing.  He is giving a history of decreased p.o. intake and also nausea and some vomiting over the past 2 or 3 days, and seems most consistent with volume depletion.  Starting patient on IV fluid bolus.  He does not have a history of congestive heart failure, however he does have lower extremity swelling.  Will reevaluate with chest x-ray and laboratory studies and how he responds to fluids with respect to other treatments for rapid A. fib.  It sound like he is definitely been declining at home, he does have a what sounds like concerning terminal diagnosis with his B-cell lymphoma.  Initial blood pressure was systolic above 016, second was in the 90s.  It again I feel like this is probably most likely related to volume depletion, will start there.  Patient found to have  multiple laboratory abnormalities.  Patient in acute renal failure, consistent with my concern about hypovolemic status.  Patient started on second liter of IV fluids.  Chest x-ray without pneumonia or frank pulmonary edema.  BNP did come elevated at 1440, so I am concerned about CHF as well.  He also has a mildly elevated troponin.  I am concerned about giving him aspirin or other blood thinner right now because of finding a new serious thrombocytopenia.  He is not having active bleeding, but  I am a little concerned about adding additional blood thinner.  His EKG does not show acute ischemic findings.  I suspect the troponin elevation is more likely related to supply demand mismatch with the elevated heart rate.  Patient hovering around 834 systolic, down to 90 and 80 systolic.  His map has been above 65.  Initially I was treating mostly for hypovolemia as a source of his low blood pressure.  However I am also going to go ahead and place patient on treatment for possible sepsis as a cause of his hypotension although I do not really have a source.  I am also going to consider his tachycardia as a possibility of causing low blood pressure.  He is overall stable, and I am going to try diltiazem rather than cardioversion.  I had a very frank discussion with his daughter and the patient about the severity of illness right now.  They are quite aware how ill he is and certainly on his prognosis from the standpoint of the underlying condition with the B-cell lymphoma.  At this point I am going to discuss with the hospitalist and leave decision with regard to platelet transfusion as well as use of aspirin for mildly elevated troponin with the hospitalist service.  DIFFERENTIAL DIAGNOSIS: Including but not limited to dehydration, sepsis, new onset atrial fibrillation, MI, congestive heart failure, pneumonia, urinary tract infection, electrolyte disturbance, lymphoma complication, etc.  CONSULTATIONS:    Hospitalist for admission.   Patient / Family / Caregiver informed of clinical course, medical decision-making process, and agree with plan.   ___________________________________________   FINAL CLINICAL IMPRESSION(S) / ED DIAGNOSES   Final diagnoses:  New onset atrial fibrillation ([place])  Atrial fibrillation with rapid ventricular response (H[facility])  Acute renal failure, unspecified acute renal failure type ([place])  Troponin level elevated  Thrombocytopenia ([place])      ___________________________________________        Note: This dictation was prepared with Dragon dictation. Any transcriptional errors that result from this process are unintentional    Lisa Roca, MD 03/30/2017 1537

## 2017-04-13 ENCOUNTER — Ambulatory Visit: Payer: Medicare Other | Admitting: Family Medicine

## 2017-04-13 ENCOUNTER — Ambulatory Visit: Payer: Self-pay

## 2017-04-13 ENCOUNTER — Inpatient Hospital Stay: Payer: Medicare Other

## 2017-04-13 ENCOUNTER — Telehealth: Payer: Self-pay | Admitting: Family Medicine

## 2017-04-13 ENCOUNTER — Inpatient Hospital Stay (HOSPITAL_COMMUNITY)
Admit: 2017-04-13 | Discharge: 2017-04-13 | Disposition: A | Discharge status: Home / Self Care | Payer: Medicare Other | Attending: Internal Medicine | Admitting: Internal Medicine

## 2017-04-13 DIAGNOSIS — C7951 Secondary malignant neoplasm of bone: Secondary | ICD-10-CM

## 2017-04-13 DIAGNOSIS — M79606 Pain in leg, unspecified: Secondary | ICD-10-CM

## 2017-04-13 DIAGNOSIS — C8330 Diffuse large B-cell lymphoma, unspecified site: Secondary | ICD-10-CM

## 2017-04-13 DIAGNOSIS — C8338 Diffuse large B-cell lymphoma, lymph nodes of multiple sites: Secondary | ICD-10-CM

## 2017-04-13 DIAGNOSIS — Z7189 Other specified counseling: Secondary | ICD-10-CM

## 2017-04-13 DIAGNOSIS — Z515 Encounter for palliative care: Secondary | ICD-10-CM

## 2017-04-13 DIAGNOSIS — D649 Anemia, unspecified: Secondary | ICD-10-CM

## 2017-04-13 DIAGNOSIS — D696 Thrombocytopenia, unspecified: Secondary | ICD-10-CM

## 2017-04-13 DIAGNOSIS — I4891 Unspecified atrial fibrillation: Principal | ICD-10-CM

## 2017-04-13 DIAGNOSIS — C61 Malignant neoplasm of prostate: Secondary | ICD-10-CM

## 2017-04-13 DIAGNOSIS — N179 Acute kidney failure, unspecified: Secondary | ICD-10-CM

## 2017-04-13 DIAGNOSIS — G8929 Other chronic pain: Secondary | ICD-10-CM

## 2017-04-13 DIAGNOSIS — R7989 Other specified abnormal findings of blood chemistry: Secondary | ICD-10-CM

## 2017-04-13 DIAGNOSIS — I351 Nonrheumatic aortic (valve) insufficiency: Secondary | ICD-10-CM

## 2017-04-13 LAB — COMPREHENSIVE METABOLIC PANEL
ALBUMIN: 2.9 g/dL — AB (ref 3.5–5.0)
ALK PHOS: 54 U/L (ref 38–126)
ALT: 35 U/L (ref 17–63)
AST: 60 U/L — ABNORMAL HIGH (ref 15–41)
Anion gap: 14 (ref 5–15)
BILIRUBIN TOTAL: 2.4 mg/dL — AB (ref 0.3–1.2)
BUN: 52 mg/dL — AB (ref 6–20)
CO2: 18 mmol/L — ABNORMAL LOW (ref 22–32)
Calcium: 8.7 mg/dL — ABNORMAL LOW (ref 8.9–10.3)
Chloride: 102 mmol/L (ref 101–111)
Creatinine, Ser: 1.92 mg/dL — ABNORMAL HIGH (ref 0.61–1.24)
GFR calc Af Amer: 33 mL/min — ABNORMAL LOW (ref >60)
GFR calc non Af Amer: 28 mL/min — ABNORMAL LOW (ref >60)
GLUCOSE: 126 mg/dL — AB (ref 65–99)
Potassium: 4.6 mmol/L (ref 3.5–5.1)
Sodium: 134 mmol/L — ABNORMAL LOW (ref 135–145)
TOTAL PROTEIN: 5.4 g/dL — AB (ref 6.5–8.1)

## 2017-04-13 LAB — ECHOCARDIOGRAM COMPLETE
Height: 71 in
WEIGHTICAEL: 3028.8 [oz_av]

## 2017-04-13 LAB — CBC
HCT: 28.2 % — ABNORMAL LOW (ref 40.0–52.0)
Hemoglobin: 9 g/dL — ABNORMAL LOW (ref 13.0–18.0)
MCH: 29.1 pg (ref 26.0–34.0)
MCHC: 32 g/dL (ref 32.0–36.0)
MCV: 90.9 fL (ref 80.0–100.0)
PLATELETS: 19 10*3/uL — AB (ref 150–440)
RBC: 3.1 MIL/uL — ABNORMAL LOW (ref 4.40–5.90)
RDW: 17.3 % — ABNORMAL HIGH (ref 11.5–14.5)
WBC: 10.8 10*3/uL — ABNORMAL HIGH (ref 3.8–10.6)

## 2017-04-13 LAB — TROPONIN I: TROPONIN I: 0.1 ng/mL — AB (ref <0.03)

## 2017-04-13 MED ORDER — SODIUM CHLORIDE 0.9 % IV SOLN
INTRAVENOUS | Status: DC
  Administered 2017-04-13: 11:00:00 via INTRAVENOUS

## 2017-04-13 MED ORDER — MORPHINE SULFATE (PF) 2 MG/ML IV SOLN
2.0000 mg | INTRAVENOUS | Status: DC | PRN
  Administered 2017-04-14 (×2): 2 mg via INTRAVENOUS
  Filled 2017-04-13 (×2): qty 1

## 2017-04-13 MED ORDER — LORAZEPAM BOLUS VIA INFUSION: 2.0000 mg | INTRAVENOUS | Status: DC | PRN

## 2017-04-13 MED ORDER — LORAZEPAM 2 MG/ML IJ SOLN
2.0000 mg | INTRAMUSCULAR | Status: DC | PRN
  Administered 2017-04-14 (×3): 2 mg via INTRAVENOUS
  Filled 2017-04-13 (×3): qty 1

## 2017-04-13 MED ORDER — HYDROCODONE-ACETAMINOPHEN 5-325 MG PO TABS
1.0000 | ORAL_TABLET | Freq: Four times a day (QID) | ORAL | Status: DC | PRN
  Administered 2017-04-13: 1 via ORAL
  Filled 2017-04-13: qty 1

## 2017-04-13 MED ORDER — DIGOXIN 0.25 MG/ML IJ SOLN
0.1250 mg | Freq: Every day | INTRAMUSCULAR | Status: DC
  Administered 2017-04-13: 0.125 mg via INTRAVENOUS
  Filled 2017-04-13: qty 0.5

## 2017-04-13 MED ORDER — MORPHINE BOLUS VIA INFUSION: 5.0000 mg | INTRAVENOUS | Status: DC | PRN

## 2017-04-13 NOTE — Care Management (Signed)
RNCM following for discharge disposition.  It is reported that daughter is requesting current level of care (IVF and abx) until tomorrow to reevaluate and determine if they would like to pursue Hospice at home.

## 2017-04-13 NOTE — Progress Notes (Signed)
   04/13/17 1330  Clinical Encounter Type  Visited With Patient;Family;Health care provider  Visit Type Initial  Referral From Physician   Chaplain responded to order received; other staff member meeting with patient/visitor; requested chaplain to return this afternoon to follow up.

## 2017-04-13 NOTE — Progress Notes (Signed)
Patient received platelet transfusion without any reactions. Rested well this shift without any incident. Still having dyspnea on exertion. 02 sat at bedtime was 88% on R//A. Patient was placed on 2L/[place]  and he's sating in the upper 90s. Will continue to monitor.

## 2017-04-13 NOTE — Progress Notes (Signed)
Family Meeting Note  Advance Directive:yes  Today a meeting took place with the Patient and son at bedside.  The following clinical team members were present during this meeting:MD and RN  The following were discussed:Patient's diagnosis:   84 y.o.malehas a past medical history significant forCOPD/asthma and b-cell lymphoma now with 5 day hx of weakness with N/V, SOB, and LE edema  * Atrial fibrillation with RVR ([place]) - initially changed to digoxin as BP running low and HR high, not ideal either due to ARF.  *stage III DLBCL (diffuse large B cell lymphoma)- palliative care approach per onco  *Thrombocytopenia ([place]): monitor  *ARF (acute renal failure) - Likely cardio renal syndrome.  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

## 2017-04-13 NOTE — Plan of Care (Signed)
Pt is A&Ox4. VSS . 2L O2 [place] . Family at bedside. Palliative team saw pt and family this shift. Will continue to monitor and report to oncoming RN .  Not Progressing Education: Knowledge of General Education information will improve 04/13/2017 1533 - Not Progressing by Aleen Campi, RN Health Behavior/Discharge Planning: Ability to manage health-related needs will improve 04/13/2017 1533 - Not Progressing by Aleen Campi, RN Clinical Measurements: Ability to maintain clinical measurements within normal limits will improve 04/13/2017 1533 - Not Progressing by Aleen Campi, RN Will remain free from infection 04/13/2017 1533 - Not Progressing by Aleen Campi, RN Diagnostic test results will improve 04/13/2017 1533 - Not Progressing by Aleen Campi, RN Respiratory complications will improve 04/13/2017 1533 - Not Progressing by Aleen Campi, RN Cardiovascular complication will be avoided 04/13/2017 1533 - Not Progressing by Aleen Campi, RN Activity: Risk for activity intolerance will decrease 04/13/2017 1533 - Not Progressing by Aleen Campi, RN Nutrition: Adequate nutrition will be maintained 04/13/2017 1533 - Not Progressing by Aleen Campi, RN Coping: Level of anxiety will decrease 04/13/2017 1533 - Not Progressing by Aleen Campi, RN Elimination: Will not experience complications related to bowel motility 04/13/2017 1533 - Not Progressing by Aleen Campi, RN Will not experience complications related to urinary retention 04/13/2017 1533 - Not Progressing by Aleen Campi, RN Pain Managment: General experience of comfort will improve 04/13/2017 1533 - Not Progressing by Aleen Campi, RN Safety: Ability to remain free from injury will improve 04/13/2017 1533 - Not Progressing by Aleen Campi, RN Skin Integrity: Risk for impaired skin integrity will decrease 04/13/2017 1533 - Not Progressing by Aleen Campi, RN

## 2017-04-13 NOTE — Progress Notes (Signed)
[facility] [place] NAME: Steven Greene    MR#:  [id number]  DATE OF BIRTH:  [birth year]  SUBJECTIVE:  CHIEF COMPLAINT:   Chief Complaint  Patient presents with  . Weakness  . Emesis  wanting to be comfortable only and not go through all this REVIEW OF SYSTEMS:  Review of Systems  Constitutional: Positive for malaise/fatigue. Negative for chills, fever and weight loss.  HENT: Negative for nosebleeds and sore throat.   Eyes: Negative for blurred vision.  Respiratory: Positive for shortness of breath. Negative for cough and wheezing.   Cardiovascular: Positive for leg swelling. Negative for chest pain, orthopnea and PND.  Gastrointestinal: Negative for abdominal pain, constipation, diarrhea, heartburn, nausea and vomiting.  Genitourinary: Negative for dysuria and urgency.  Musculoskeletal: Negative for back pain.  Skin: Negative for rash.  Neurological: Positive for weakness. Negative for dizziness, speech change, focal weakness and headaches.  Endo/Heme/Allergies: Does not bruise/bleed easily.  Psychiatric/Behavioral: Negative for depression.   DRUG ALLERGIES:  No Known Allergies VITALS:  Blood pressure (!) 103/54, pulse (!) 107, temperature 98.2 F (36.8 C), resp. rate 19, height 5\' 11"  (1.803 m), weight 85.9 kg (189 lb 4.8 oz), SpO2 100 %. PHYSICAL EXAMINATION:  Physical Exam  Constitutional: He is oriented to person, place, and time. He appears malnourished. He appears unhealthy. He has a sickly appearance.  HENT:  Head: Normocephalic and atraumatic.  Eyes: Conjunctivae and EOM are normal. Pupils are equal, round, and reactive to light.  Neck: Normal range of motion. Neck supple. No tracheal deviation present. No thyromegaly present.  Cardiovascular: Normal rate, regular rhythm and normal heart sounds.  Pulmonary/Chest: Effort normal and breath sounds normal. No respiratory distress. He has no wheezes. He exhibits no tenderness.    Abdominal: Soft. Bowel sounds are normal. He exhibits no distension. There is no tenderness.  Musculoskeletal: Normal range of motion. He exhibits edema.  Neurological: He is alert and oriented to person, place, and time. No cranial nerve deficit.  Skin: Skin is warm and dry. No rash noted.  Psychiatric: Mood and affect normal.   LABORATORY PANEL:  Male CBC Recent Labs  Lab 04/13/17 0359  WBC 10.8*  HGB 9.0*  HCT 28.2*  PLT 19*   ------------------------------------------------------------------------------------------------------------------ Chemistries  Recent Labs  Lab 04/13/17 0359  NA 134*  K 4.6  CL 102  CO2 18*  GLUCOSE 126*  BUN 52*  CREATININE 1.92*  CALCIUM 8.7*  AST 60*  ALT 35  ALKPHOS 54  BILITOT 2.4*   RADIOLOGY:  US Renal  Result Date: 04/13/2017 CLINICAL DATA:  Inpatient.  Acute renal failure.  Lymphoma. EXAM: RENAL / URINARY TRACT ULTRASOUND COMPLETE COMPARISON:  02/23/2017 PET-CT. FINDINGS: Right Kidney: Length: 9.4 cm. Echogenic and mildly atrophic right renal parenchyma. No right hydronephrosis. No right renal mass. Left Kidney: Length: 10.1 cm. Echogenic and mildly atrophic left renal parenchyma. No left hydronephrosis. Exophytic simple 2.0 x 1.8 x 2.0 cm interpolar left renal cyst. No additional left renal lesions. Bladder: Appears normal for degree of bladder distention. IMPRESSION: 1. No hydronephrosis. 2. Echogenic and mildly atrophic kidneys bilaterally, compatible with nonspecific acute on chronic renal parenchymal disease. 3. Benign-appearing interpolar left renal cyst. 4. Normal bladder. Electronically Signed   By: Ilona Sorrel M.D.   On: 04/13/2017 10:41   ASSESSMENT AND PLAN:  84 y.o. male has a past medical history significant for COPD/asthma and b-cell lymphoma now with 5 day hx of weakness with N/V, SOB, and  LE edema  * Atrial fibrillation with RVR ([facility]) - initially changed to digoxin as BP running low and HR high, not ideal either due  to ARF.  * stage III DLBCL (diffuse large B cell lymphoma)- palliative care approach per onco  * Thrombocytopenia ([place]): monitor  * ARF (acute renal failure) - Likely cardio renal syndrome.  * Progressive stage IV prostate cancer with bony metastasis:    * Anemia/Thrombocytopenia: Significantly worse.  Likely due to progressive disease.  Monitor.   He is very appropriate for Hospice at Home. Spent long time with patient, son and daughter at bedside and they're agreeable        All the records are reviewed and case discussed with Care Management/Social Worker. Management plans discussed with the patient, family (son, daughter at bedside) and they are in agreement.  CODE STATUS: DNR  TOTAL TIME TAKING CARE OF THIS PATIENT: 35 minutes.   More than 50% of the time was spent in counseling/coordination of care: YES  POSSIBLE D/C IN 1-2 DAYS, DEPENDING ON CLINICAL CONDITION.   Max Sane M.D on 04/13/2017 at 5:23 PM  Between 7am to 6pm - Pager - [phone]  After 6pm go to [email or web address] - password [email or web address] [facility]  [employer] [place]  Office  [phone]  CC: Primary care physician; Jerrol Banana., MD  Note: This dictation was prepared with Dragon dictation along with smaller phrase technology. Any transcriptional errors that result from this process are unintentional.

## 2017-04-13 NOTE — Consult Note (Signed)
[facility] CONSULT NOTE    Date: 04/13/2017                  Patient Name:  Steven Greene  MRN: [id number]  DOB: September 20, 1919  Age / Sex: 84 y.o., male         PCP: Maple Hudson., MD                 Service Requesting Consult: Hospitalist                 Reason for Consult: Acute renal failure            History of Present Illness: Patient is a 84 y.o. male with a PMHx of progressive B-cell lymphoma and prostate cancer, COPD, asthma, chronic bilateral lower extremity edema, left foot drop who was admitted to [facility] on 05/03/2017 for evaluation of decreased p.o. intake and malaise.  Patient has known underlying progressive B-cell lymphoma as well as underlying prostate cancer.  After discussion with hematology it appears that the patient has been declining.  Patient and his son report that he has had decreased p.o. intake and appetite over the past week.  Patient has also had periods of nausea, vomiting, and diarrhea.  We are asked to see him for acute renal failure.  Patient's baseline creatinine appears to be 1.06.  BUN is currently up to 52 with a creatinine of 1.92.  In addition patient was given a dose of Lasix yesterday.  It appears that Dr. Sherryll Burger has met with the family and they are currently considering palliative and hospice care.   Medications: Outpatient medications: Medications Prior to Admission  Medication Sig Dispense Refill Last Dose  . acetaminophen (TYLENOL) 500 MG tablet Take 500 mg by mouth every 6 (six) hours as needed.   PRN at PRN  . Fluticasone-Salmeterol (ADVAIR DISKUS) 100-50 MCG/DOSE AEPB Inhale 1 puff into the lungs 2 (two) times daily. (Patient taking differently: Inhale 1 puff into the lungs daily. ) 60 each 12 03-May-2017 at AM  . pantoprazole (PROTONIX) 40 MG tablet Take 40 mg by mouth daily.   PRN at PRN  . predniSONE (DELTASONE) 20 MG tablet TAKE 1 TABLET BY MOUTH ONCE DAILY WITH  BREAKFAST 30 tablet 1 03-May-2017 at AM  .  propranolol (INDERAL) 20 MG tablet TAKE ONE TABLET BY MOUTH ONCE DAILY 90 tablet 3 03-May-2017 at AM  . ranitidine (ZANTAC) 150 MG tablet Take 1 tablet (150 mg total) by mouth 2 (two) times daily. (Patient not taking: Reported on 05-03-17) 60 tablet 11 -- at --    Current medications: Current Facility-Administered Medications  Medication Dose Route Frequency Provider Last Rate Last Dose  . 0.9 %  sodium chloride infusion  250 mL Intravenous PRN Marguarite Arbour, MD      . 0.9 %  sodium chloride infusion   Intravenous Continuous Etan Vasudevan, MD 40 mL/hr at 04/13/17 1050    . acetaminophen (TYLENOL) tablet 650 mg  650 mg Oral Q6H PRN Marguarite Arbour, MD   650 mg at 04/13/17 1047   Or  . acetaminophen (TYLENOL) suppository 650 mg  650 mg Rectal Q6H PRN Marguarite Arbour, MD      . bisacodyl (DULCOLAX) suppository 10 mg  10 mg Rectal Daily PRN Marguarite Arbour, MD      . digoxin (LANOXIN) 0.25 MG/ML injection 0.125 mg  0.125 mg Intravenous Daily Delfino Lovett, MD   0.125 mg at 04/13/17 0959  .  diltiazem (CARDIZEM) tablet 30 mg  30 mg Oral Q6H Marguarite Arbour, MD   30 mg at 04/13/17 0500  . docusate sodium (COLACE) capsule 100 mg  100 mg Oral BID Marguarite Arbour, MD   100 mg at 04/13/17 1000  . ipratropium-albuterol (DUONEB) 0.5-2.5 (3) MG/3ML nebulizer solution 3 mL  3 mL Nebulization QID Marguarite Arbour, MD   3 mL at 04/13/17 0738  . mometasone-formoterol (DULERA) 100-5 MCG/ACT inhaler 2 puff  2 puff Inhalation BID Marguarite Arbour, MD   2 puff at 04/13/17 [phone]  . ondansetron (ZOFRAN) tablet 4 mg  4 mg Oral Q6H PRN Marguarite Arbour, MD       Or  . ondansetron (ZOFRAN) injection 4 mg  4 mg Intravenous Q6H PRN Marguarite Arbour, MD      . pantoprazole (PROTONIX) injection 40 mg  40 mg Intravenous Q12H Marguarite Arbour, MD   40 mg at 04/13/17 1000  . piperacillin-tazobactam (ZOSYN) IVPB 3.375 g  3.375 g Intravenous Q8H Marguarite Arbour, MD   Stopped at 04/13/17 [phone]  . predniSONE  (DELTASONE) tablet 20 mg  20 mg Oral Q breakfast Marguarite Arbour, MD   20 mg at 04/13/17 8119  . sodium chloride flush (NS) 0.9 % injection 3 mL  3 mL Intravenous Q12H Marguarite Arbour, MD   3 mL at 04/13/17 1003  . sodium chloride flush (NS) 0.9 % injection 3 mL  3 mL Intravenous PRN Marguarite Arbour, MD      . vancomycin (VANCOCIN) IVPB 1000 mg/200 mL premix  1,000 mg Intravenous Q24H Marguarite Arbour, MD   Stopped at 04/05/2017 2305      Allergies: No Known Allergies    Past Medical History: Past Medical History:  Diagnosis Date  . Arthritis   . Asthma   . Cancer [facility])    prostate cancer-treated medically  . COPD (chronic obstructive pulmonary disease) (HCC)   . Foot drop, left   . Hernia, inguinal, left   . HOH (hard of hearing)   . Tremors of nervous system      Past Surgical History: Past Surgical History:  Procedure Laterality Date  . ANKLE FRACTURE SURGERY  2009   lt  . ANKLE FRACTURE SURGERY     rt  . EYE SURGERY     both cataracts  . HEMORRHOID SURGERY    . JOINT REPLACEMENT  2000   rt hip replacement  . WRIST FRACTURE SURGERY Bilateral      Family History: Family History  Problem Relation Age of Onset  . Heart attack Father   . Tremor Father   . Cancer Sister   . Stomach cancer Brother   . Heart attack Sister   . Heart attack Sister      Social History: Social History   Socioeconomic History  . Marital status: Married    Spouse name: Not on file  . Number of children: Not on file  . Years of education: Not on file  . Highest education level: Not on file  Social Needs  . Financial resource strain: Not on file  . Food insecurity - worry: Not on file  . Food insecurity - inability: Not on file  . Transportation needs - medical: Not on file  . Transportation needs - non-medical: Not on file  Occupational History  . Not on file  Tobacco Use  . Smoking status: Never Smoker  . Smokeless tobacco: Never Used  Substance and Sexual  Activity   . Alcohol use: No  . Drug use: No  . Sexual activity: Not on file  Other Topics Concern  . Not on file  Social History Narrative  . Not on file     Review of Systems: Review of Systems  Constitutional: Positive for malaise/fatigue. Negative for chills and fever.  HENT: Positive for hearing loss. Negative for congestion and nosebleeds.   Eyes: Negative for blurred vision, double vision and photophobia.  Respiratory: Positive for shortness of breath. Negative for sputum production.   Cardiovascular: Positive for leg swelling. Negative for chest pain and palpitations.  Gastrointestinal: Positive for diarrhea, nausea and vomiting. Negative for heartburn.  Genitourinary: Negative for dysuria, frequency and urgency.  Musculoskeletal: Positive for joint pain and myalgias.  Skin: Positive for rash.  Neurological: Positive for weakness. Negative for dizziness and focal weakness.  Endo/Heme/Allergies: Negative for polydipsia. Does not bruise/bleed easily.  Psychiatric/Behavioral: Negative for depression. The patient is not nervous/anxious.      Vital Signs: Blood pressure (!) 103/54, pulse (!) 107, temperature 98.2 F (36.8 C), resp. rate 19, height 5\' 11"  (1.803 m), weight 85.9 kg (189 lb 4.8 oz), SpO2 100 %.  Weight trends: Filed Weights   Apr 27, 2017 1426 04/27/2017 1548 04/13/17 0352  Weight: 80.7 kg (178 lb) 85.7 kg (188 lb 14.4 oz) 85.9 kg (189 lb 4.8 oz)    Physical Exam: General: NAD, laying in bed  Head: Normocephalic, atraumatic.  Eyes: Anicteric, EOMI  Nose: Mucous membranes moist, not inflammed, nonerythematous.  Throat: Oropharynx nonerythematous, no exudate appreciated.   Neck: Supple, trachea midline.  Lungs:  Normal respiratory effort. Diminished at bases  Heart: Irregular, tachycardic  Abdomen:  BS normoactive. Soft, Nondistended, non-tender.  No masses or organomegaly.  Extremities: 3+ b/l LE edema with dependent rubor  Neurologic: A&O X3, Motor strength is 5/5 in  the all 4 extremities  Skin: Dependent rubor in b/l LE edemas    Lab results: Basic Metabolic Panel: Recent Labs  Lab 04-27-2017 1043 04/13/17 0359  NA 136 134*  K 5.1 4.6  CL 103 102  CO2 20* 18*  GLUCOSE 145* 126*  BUN 48* 52*  CREATININE 1.72* 1.92*  CALCIUM 9.2 8.7*    Liver Function Tests: Recent Labs  Lab April 27, 2017 1043 04/13/17 0359  AST 38 60*  ALT 21 35  ALKPHOS 61 54  BILITOT 1.2 2.4*  PROT 6.1* 5.4*  ALBUMIN 3.3* 2.9*   Recent Labs  Lab 2017-04-27 1043  LIPASE 52*   No results for input(s): AMMONIA in the last 168 hours.  CBC: Recent Labs  Lab 2017/04/27 1043 April 27, 2017 1421 04/13/17 0359  WBC 9.9  --  10.8*  HGB 11.0*  --  9.0*  HCT 34.5*  --  28.2*  MCV 91.1  --  90.9  PLT 9* 10* 19*    Cardiac Enzymes: Recent Labs  Lab 04/27/2017 1043 04-27-2017 1632 2017/04/27 2238 04/13/17 0359  TROPONINI 0.34* 0.09* 0.11* 0.10*    BNP: Invalid input(s): POCBNP  CBG: No results for input(s): GLUCAP in the last 168 hours.  Microbiology: Results for orders placed or performed during the hospital encounter of 27-Apr-2017  Culture, blood (routine x 2)     Status: None (Preliminary result)   Collection Time: April 27, 2017 11:20 AM  Result Value Ref Range Status   Specimen Description BLOOD LEFT ARM  Final   Special Requests   Final    BOTTLES DRAWN AEROBIC AND ANAEROBIC Blood Culture results may not be optimal due to an  excessive volume of blood received in culture bottles   Culture   Final    NO GROWTH < 24 HOURS Performed at [facility], [address] [place], [place] [address]    Report Status PENDING  Incomplete  Culture, blood (routine x 2)     Status: None (Preliminary result)   Collection Time: 04-27-17 11:21 AM  Result Value Ref Range Status   Specimen Description BLOOD RIGHT ARM  Final   Special Requests   Final    BOTTLES DRAWN AEROBIC AND ANAEROBIC Blood Culture adequate volume   Culture   Final    NO GROWTH < 24 HOURS Performed at  [facility], [address], [place], [place] [address]    Report Status PENDING  Incomplete    Coagulation Studies: No results for input(s): LABPROT, INR in the last 72 hours.  Urinalysis: Recent Labs    04/27/17 2030  COLORURINE YELLOW*  LABSPEC 1.009  PHURINE 5.0  GLUCOSEU NEGATIVE  HGBUR SMALL*  BILIRUBINUR NEGATIVE  KETONESUR NEGATIVE  PROTEINUR NEGATIVE  NITRITE NEGATIVE  LEUKOCYTESUR NEGATIVE      Imaging: US Renal  Result Date: 04/13/2017 CLINICAL DATA:  Inpatient.  Acute renal failure.  Lymphoma. EXAM: RENAL / URINARY TRACT ULTRASOUND COMPLETE COMPARISON:  02/23/2017 PET-CT. FINDINGS: Right Kidney: Length: 9.4 cm. Echogenic and mildly atrophic right renal parenchyma. No right hydronephrosis. No right renal mass. Left Kidney: Length: 10.1 cm. Echogenic and mildly atrophic left renal parenchyma. No left hydronephrosis. Exophytic simple 2.0 x 1.8 x 2.0 cm interpolar left renal cyst. No additional left renal lesions. Bladder: Appears normal for degree of bladder distention. IMPRESSION: 1. No hydronephrosis. 2. Echogenic and mildly atrophic kidneys bilaterally, compatible with nonspecific acute on chronic renal parenchymal disease. 3. Benign-appearing interpolar left renal cyst. 4. Normal bladder. Electronically Signed   By: Delbert Phenix M.D.   On: 04/13/2017 10:41   Dg Chest Port 1 View  Result Date: April 27, 2017 CLINICAL DATA:  Atrial fibrillation. History of prostate cancer and B-cell lymphoma. EXAM: PORTABLE CHEST 1 VIEW COMPARISON:  Chest radiograph April 12, 2015; PET-CT February 23, 2017 FINDINGS: There is atelectatic change in the right base. There is no edema or consolidation. Heart is mildly enlarged with pulmonary vascularity normal. No adenopathy is evident by radiography. There is aortic atherosclerosis. There is degenerative change in each shoulder. IMPRESSION: Aortic atherosclerosis. Atelectasis right base. No edema or consolidation. Heart mildly enlarged.  Aortic Atherosclerosis (ICD10-I70.0). Electronically Signed   By: Bretta Bang III M.D.   On: 04/27/2017 11:56      Assessment & Plan: Pt is a 84 y.o. male with a PMHx of progressive B-cell lymphoma and prostate cancer, COPD, asthma, chronic bilateral lower extremity edema, left foot drop who was admitted to [facility] on 04-27-2017 for evaluation of decreased p.o. intake and malaise.  Patient found to have acute renal failure as well as thrombocytopenia.  1.  Acute renal failure suspect secondary to decreased p.o. intake. 2.  Anemia unspecified, thrombocytopenia. 3.  Bilateral lower extremity edema with erythematous lower extremities. 4.  Progressive B-cell lymphoma as well as prostate cancer.  Plan: We were asked to see the patient for evaluation management of acute renal failure.  His chest x-ray did not show obvious pulmonary edema.  He is also had decreased p.o. intake and has relatively low blood pressure.  Therefore we will proceed with cautious IV fluid hydration with 0.9 normal saline at 50 cc/h.  We will also obtain a renal ultrasound.  Given his  underlying comorbidities however we would not recommend hemodialysis should his renal function continue to worsen.  He is noted is having thrombocytopenia with platelets of 19,000.  I did discuss the case with Dr. Orlie Dakin who did not feel that the patient has underlying TTP.  Most likely related to his progressive B-cell lymphoma.  Further plan as patient progresses however we agreed that patient is likely best served with palliative and hospice care services.

## 2017-04-13 NOTE — Progress Notes (Deleted)
Platelet transfusion completed without any reactions. Will continue to monitor.

## 2017-04-13 NOTE — Telephone Encounter (Signed)
Dtr notified me this am that he was taken to ER over weekend and she has also cancelled appt f/u with you today.  Disregard message.  Thanks!

## 2017-04-13 NOTE — Progress Notes (Signed)
*  PRELIMINARY RESULTS* Echocardiogram 2D Echocardiogram has been performed.  Steven Greene 04/13/2017, 11:50 AM

## 2017-04-13 NOTE — Progress Notes (Signed)
Patient presently resting in the bed, patient alert and oriented, denies any pain at this time, family at bedside, patient on comfort care at [facility]  Time, no distress noted

## 2017-04-13 NOTE — Consult Note (Signed)
Consultation Note Date: 04/13/2017   Patient Name: Steven Greene  DOB: 17-Nov-1919  MRN: [id number]  Age / Sex: 84 y.o., male  PCP: Jerrol Banana., MD Referring Physician: Max Sane, MD  Reason for Consultation: Establishing goals of care  HPI/Patient Profile: Steven Greene is a 84 y.o. male has a past medical history significant for COPD/asthma and b-cell lymphoma now with 5 day hx of weakness with N/V, SOB, and LE edema.   Clinical Assessment and Goals of Care: Steven Greene is resting in bed. He lives at home alone and his family comes to see him and helps with shopping. His daughter who is POA is at bedside. Per his daughter, his oral intake has declined and his physical status has declined as he has been progressively weaker.    We discussed diagnosis, prognosis, GOC, EOL wishes disposition and options. We discussed oncology notes and other specialty notes.   A detailed discussion was had today regarding advanced directives.  Concepts specific to code status, artifical feeding and hydration, continued IV antibiotics and rehospitalization was had.  The difference between an aggressive medical intervention path and a hospice comfort care path for this patient at this time was discussed.  Values and goals of care important to patient and family were attempted to be elicited.  Steven Greene states multiple times he does not want to hang on for the sake of hanging on, and does not want to continue aggressive care for just for a few more days of life. His daughter is concerned he does not understand. She asked him if he understood how sick he was and he stated he did. She asked him if he wanted to come home and just be comfortable and he stated if did. She remains unsure of his understanding. She is requesting current level of care (IVF and abx) until tomorrow to reevaluate.   Called and spoke with Dr. Manuella Ghazi, he  states he will speak with family.    SUMMARY OF RECOMMENDATIONS    Dr. Manuella Ghazi states he will speak with family and move forward with conversations.    Code Status/Advance Care Planning:  DNR   Palliative Prophylaxis:   Oral Care    Psycho-social/Spiritual:   Steven Greene at bedside.  Prognosis:   < 2 weeks Elevated creatinine, dehydration, poor oral intake. Progressvie cancer.   Discharge Planning: To Be Determined      Primary Diagnoses: Present on Admission: . DLBCL (diffuse large B cell lymphoma) ([place])   I have reviewed the medical record, interviewed the patient and family, and examined the patient. The following aspects are pertinent.  Past Medical History:  Diagnosis Date  . Arthritis   . Asthma   . Cancer [facility])    prostate cancer-treated medically  . COPD (chronic obstructive pulmonary disease) ([place])   . Foot drop, left   . Hernia, inguinal, left   . HOH (hard of hearing)   . Tremors of nervous system    Social History   Socioeconomic History  . Marital  status: Married    Spouse name: None  . Number of children: None  . Years of education: None  . Highest education level: None  Social Needs  . Financial resource strain: None  . Food insecurity - worry: None  . Food insecurity - inability: None  . Transportation needs - medical: None  . Transportation needs - non-medical: None  Occupational History  . None  Tobacco Use  . Smoking status: Never Smoker  . Smokeless tobacco: Never Used  Substance and Sexual Activity  . Alcohol use: No  . Drug use: No  . Sexual activity: None  Other Topics Concern  . None  Social History Narrative  . None   Family History  Problem Relation Age of Onset  . Heart attack Father   . Tremor Father   . Cancer Sister   . Stomach cancer Brother   . Heart attack Sister   . Heart attack Sister    Scheduled Meds: . docusate sodium  100 mg Oral BID  . pantoprazole (PROTONIX) IV  40 mg Intravenous Q12H    Continuous Infusions: PRN Meds:.acetaminophen **OR** acetaminophen, bisacodyl, LORazepam, morphine injection, ondansetron **OR** ondansetron (ZOFRAN) IV Medications Prior to Admission:  Prior to Admission medications   Medication Sig Start Date End Date Taking? Authorizing Provider  acetaminophen (TYLENOL) 500 MG tablet Take 500 mg by mouth every 6 (six) hours as needed.   Yes [provider]  Fluticasone-Salmeterol (ADVAIR DISKUS) 100-50 MCG/DOSE AEPB Inhale 1 puff into the lungs 2 (two) times daily. Patient taking differently: Inhale 1 puff into the lungs daily.  12/01/16  Yes Jerrol Banana., MD  pantoprazole (PROTONIX) 40 MG tablet Take 40 mg by mouth daily.   Yes [provider]  predniSONE (DELTASONE) 20 MG tablet TAKE 1 TABLET BY MOUTH ONCE DAILY WITH  BREAKFAST 04/01/17  Yes Lloyd Huger, MD  propranolol (INDERAL) 20 MG tablet TAKE ONE TABLET BY MOUTH ONCE DAILY 09/01/16  Yes Jerrol Banana., MD  ranitidine (ZANTAC) 150 MG tablet Take 1 tablet (150 mg total) by mouth 2 (two) times daily. Patient not taking: Reported on 04/05/2017 01/20/17   Jerrol Banana., MD   No Known Allergies Review of Systems  Neurological: Positive for weakness.    Physical Exam  Constitutional: No distress.  HENT:  Head: Normocephalic.  Pulmonary/Chest: Effort normal.  Neurological: He is alert.  Oriented.  Skin: Skin is warm and dry.    Vital Signs: BP (!) 103/54 (BP Location: Right Arm)   Pulse (!) 107   Temp 98.2 F (36.8 C)   Resp 19   Ht 5\' 11"  (1.803 m)   Wt 85.9 kg (189 lb 4.8 oz)   SpO2 100%   BMI 26.40 kg/m  Pain Assessment: 0-10   Pain Score: 6    SpO2: SpO2: 100 % O2 Device:SpO2: 100 % O2 Flow Rate: .O2 Flow Rate (L/min): 2 L/min  IO: Intake/output summary:   Intake/Output Summary (Last 24 hours) at 04/13/2017 1448 Last data filed at 04/13/2017 1403 Gross per 24 hour  Intake 2342 ml  Output 525 ml  Net 1817 ml    LBM: Last  BM Date: 04/05/2017 Baseline Weight: Weight: 80.7 kg (178 lb) Most recent weight: Weight: 85.9 kg (189 lb 4.8 oz)     Palliative Assessment/Data:20%     Time In: 1:50 Time Out:2:00 Time Total: 70 min Greater than 50%  of this time was spent counseling and coordinating care related to the above  assessment and plan.  Signed by: Asencion Gowda, NP   Please contact Palliative Medicine Team phone at [phone] for questions and concerns.  For individual provider: See Shea Evans

## 2017-04-13 NOTE — Consult Note (Signed)
Cardiology Consultation Note    Patient ID: Steven Greene, MRN: [id number], DOB/AGE: [birth year] 84 y.o. Admit date: 04/20/2017   Date of Consult: 04/13/2017 Primary Physician: Jerrol Banana., MD Primary Cardiologist: none  Chief Complaint: weakness Reason for Consultation: afib Requesting MD: Dr. Carlynn Spry  HPI: Steven Greene is a 84 y.o. male with history of chronic COPD as well as B-cell lymphoma who was admitted after presenting to the emergency room with 1 week history of nausea vomiting shortness of breath and lower extremity edema.  In the emergency room he was noted to be in atrial fibrillation with rapid ventricular response with associated hypotension.  He was also severely thrombocytopenic with a platelet count of 9000.  He was not hypoxic.  His chest x-ray showed no pulmonary edema.  Serum troponin was mildly elevated at 0.34.  Values are 0.09, 0.11, 0.10.  BNP was markedly elevated 1448.  CBC revealed hematocrit of 11.  Most recent electrocardiograms available have revealed sinus rhythm with no previous atrial fibrillation.  Not a good historian but per the chart, family members have reported both food and water over the past several weeks.  Cell lymphoma.  His last chemotherapy was in October.  He was placed on diltiazem for rate control.  Past Medical History:  Diagnosis Date  . Arthritis   . Asthma   . Cancer [facility])    prostate cancer-treated medically  . COPD (chronic obstructive pulmonary disease) ([place])   . Foot drop, left   . Hernia, inguinal, left   . HOH (hard of hearing)   . Tremors of nervous system       Surgical History:  Past Surgical History:  Procedure Laterality Date  . ANKLE FRACTURE SURGERY  2009   lt  . ANKLE FRACTURE SURGERY     rt  . EYE SURGERY     both cataracts  . HEMORRHOID SURGERY    . JOINT REPLACEMENT  2000   rt hip replacement  . WRIST FRACTURE SURGERY Bilateral      Home Meds: Prior to Admission medications   Medication Sig  Start Date End Date Taking? Authorizing Provider  acetaminophen (TYLENOL) 500 MG tablet Take 500 mg by mouth every 6 (six) hours as needed.   Yes [provider]  Fluticasone-Salmeterol (ADVAIR DISKUS) 100-50 MCG/DOSE AEPB Inhale 1 puff into the lungs 2 (two) times daily. Patient taking differently: Inhale 1 puff into the lungs daily.  12/01/16  Yes Jerrol Banana., MD  pantoprazole (PROTONIX) 40 MG tablet Take 40 mg by mouth daily.   Yes [provider]  predniSONE (DELTASONE) 20 MG tablet TAKE 1 TABLET BY MOUTH ONCE DAILY WITH  BREAKFAST 04/01/17  Yes Lloyd Huger, MD  propranolol (INDERAL) 20 MG tablet TAKE ONE TABLET BY MOUTH ONCE DAILY 09/01/16  Yes Jerrol Banana., MD  ranitidine (ZANTAC) 150 MG tablet Take 1 tablet (150 mg total) by mouth 2 (two) times daily. Patient not taking: Reported on 03/27/2017 01/20/17   Jerrol Banana., MD    Inpatient Medications:  . diltiazem  30 mg Oral Q6H  . docusate sodium  100 mg Oral BID  . ipratropium-albuterol  3 mL Nebulization QID  . mometasone-formoterol  2 puff Inhalation BID  . pantoprazole (PROTONIX) IV  40 mg Intravenous Q12H  . predniSONE  20 mg Oral Q breakfast  . sodium chloride flush  3 mL Intravenous Q12H   . sodium chloride    .  piperacillin-tazobactam (ZOSYN)  IV 3.375 g (04/13/17 0500)  . vancomycin Stopped (04/01/2017 2305)    Allergies: No Known Allergies  Social History   Socioeconomic History  . Marital status: Married    Spouse name: Not on file  . Number of children: Not on file  . Years of education: Not on file  . Highest education level: Not on file  Social Needs  . Financial resource strain: Not on file  . Food insecurity - worry: Not on file  . Food insecurity - inability: Not on file  . Transportation needs - medical: Not on file  . Transportation needs - non-medical: Not on file  Occupational History  . Not on file  Tobacco Use  . Smoking status: Never Smoker  .  Smokeless tobacco: Never Used  Substance and Sexual Activity  . Alcohol use: No  . Drug use: No  . Sexual activity: Not on file  Other Topics Concern  . Not on file  Social History Narrative  . Not on file     Family History  Problem Relation Age of Onset  . Heart attack Father   . Tremor Father   . Cancer Sister   . Stomach cancer Brother   . Heart attack Sister   . Heart attack Sister      Review of Systems: A 12-system review of systems was performed and is negative except as noted in the HPI.  Labs: Recent Labs    04/03/2017 1043 04/23/2017 1632 03/26/2017 2238 04/13/17 0359  TROPONINI 0.34* 0.09* 0.11* 0.10*   Lab Results  Component Value Date   WBC 10.8 (H) 04/13/2017   HGB 9.0 (L) 04/13/2017   HCT 28.2 (L) 04/13/2017   MCV 90.9 04/13/2017   PLT 19 (LL) 04/13/2017    Recent Labs  Lab 04/13/17 0359  NA 134*  K 4.6  CL 102  CO2 18*  BUN 52*  CREATININE 1.92*  CALCIUM 8.7*  PROT 5.4*  BILITOT 2.4*  ALKPHOS 54  ALT 35  AST 60*  GLUCOSE 126*   No results found for: CHOL, HDL, LDLCALC, TRIG No results found for: DDIMER  Radiology/Studies:  Dg Chest Port 1 View  Result Date: 04/19/2017 CLINICAL DATA:  Atrial fibrillation. History of prostate cancer and B-cell lymphoma. EXAM: PORTABLE CHEST 1 VIEW COMPARISON:  Chest radiograph April 12, 2015; PET-CT February 23, 2017 FINDINGS: There is atelectatic change in the right base. There is no edema or consolidation. Heart is mildly enlarged with pulmonary vascularity normal. No adenopathy is evident by radiography. There is aortic atherosclerosis. There is degenerative change in each shoulder. IMPRESSION: Aortic atherosclerosis. Atelectasis right base. No edema or consolidation. Heart mildly enlarged. Aortic Atherosclerosis (ICD10-I70.0). Electronically Signed   By: Lowella Grip III M.D.   On: 04/09/2017 11:56    Wt Readings from Last 3 Encounters:  04/13/17 85.9 kg (189 lb 4.8 oz)  02/25/17 80.7 kg (178 lb)   01/20/17 80.7 kg (178 lb)    EKG: Atrial fibrillation with rapid ventricular response  Physical Exam: Blood pressure 93/65, pulse (!) 143, temperature 97.8 F (36.6 C), resp. rate 18, height 5\' 11"  (1.803 m), weight 85.9 kg (189 lb 4.8 oz), SpO2 97 %. Body mass index is 26.4 kg/m. General: No acute distress Head: Normocephalic, atraumatic, sclera non-icteric, no xanthomas, nares are without discharge.  Neck: Negative for carotid bruits. JVD not elevated. Lungs: Clear bilaterally to auscultation without wheezes, rales, or rhonchi. Breathing is unlabored. Heart: Irregular irregular rhythm with S1 S2. Marland Kitchen  Abdomen: Soft, non-tender, non-distended with normoactive bowel sounds. No hepatomegaly. No rebound/guarding. No obvious abdominal masses. Msk:  Strength and tone appear normal for age. Extremities: No clubbing or cyanosis. No edema.  Distal pedal pulses are 2+ and equal bilaterally. Neuro: Alert and oriented X 3. No facial asymmetry. No focal deficit. Moves all extremities spontaneously. Psych: Somewhat slowed mentation.       Assessment and Plan  84 year old male to lymphoma treated with chemotherapy, history of COPD but no obvious history of atrial fibrillation.  He was admitted after apparent 1-2 weeks of increasing weakness and fatigue.  He has reported p.o. intake which was decreased.  Patient is a very difficult historian.  He denies chest pain or shortness of breath.  Currently his creatinine is 1.92.  He remains in atrial fibrillation with fairly rapid ventricular response.  Limitations regarding rate control have been his blood pressure.  He is not a candidate for anticoagulation due to profound thrombocytopenia.  TSH is 0.7.  His most recent platelet count was 19,000 after transfusion.  Hemoglobin is 9.0.  Continue to attempt rate control with Cardizem 30 mg every 6 hours following hemodynamics and heart rate response.  We will need to consider using digoxin judiciously given renal  function and stage however may need this for rate control.  Again would defer chronic anticoagulation due to thrombocytopenia for now.  Chads score is at least 2 for age.  Echocardiogram is pending to evaluate LV function.  No CHF on chest x-ray.His HAS-BLED bleeding risk score is 3 which is high risk.  Signed, Teodoro Spray MD 04/13/2017, 7:12 AM Pager: [phone]

## 2017-04-13 NOTE — Progress Notes (Signed)
Per orders pt comfort care, but per Dr. Manuella Ghazi can keep IVF running overnight and he will reevaluate with pt and family in morning.

## 2017-04-13 NOTE — Progress Notes (Signed)
Family Meeting Note  Advance Directive:yes  Today a meeting took place with the Patient and son and daughter, 2 neighbors  in family room.  The following clinical team members were present during this meeting:MD and RN  The following were discussed:Patient's diagnosis:   84 y.o.malehas a past medical history significant forCOPD/asthma and b-cell lymphoma now with 5 day hx of weakness with N/V, SOB, and LE edema  * Atrial fibrillation with RVR ([place]) - initially changed to digoxin as BP running low and HR high, not ideal either due to ARF.  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 She had lots of questions which were all answered and she and rest of the family agreeable with [facility].  Time spent during discussion:30 minutes  Max Sane, MD
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
## 2017-04-13 NOTE — Consult Note (Signed)
[place]  [phone] [phone]  ID: [name] OB: [birth year]  MR#: [id number]  GGE#:[id number]  Patient Care Team: Jerrol Banana., MD as PCP - General (Unknown Physician Specialty)  CHIEF COMPLAINT: Progressive diffuse large B-cell lymphoma, progressive stage IV prostate cancer, thrombocytopenia, atrial fibrillation with RVR.  INTERVAL HISTORY: Patient is a 84 year old male who was initially evaluated in the emergency room and noted to have rapid A. fib with hypotension.  Blood work also revealed a platelet count of 9.  Currently, there are no family members in the room and patient is a poor historian.  He states he feels improved since admission and nearly back to his baseline.  He has increased weakness and fatigue.  He has chronic leg pain.  He has no neurologic complaints.  He denies any recent fevers.  He has a good appetite and denies weight loss.  He has no chest pain, shortness of breath, cough, or hemoptysis.  He denies any nausea, vomiting, constipation, or diarrhea.  He has no melena or hematochezia.  He has no urinary complaints.  Patient offers no further specific complaints today.  REVIEW OF SYSTEMS:   Review of Systems  Constitutional: Positive for malaise/fatigue. Negative for fever and weight loss.  Respiratory: Negative.  Negative for cough, hemoptysis and shortness of breath.   Cardiovascular: Negative.  Negative for chest pain.  Gastrointestinal: Negative.  Negative for abdominal pain, blood in stool and melena.  Genitourinary: Negative.   Musculoskeletal: Positive for joint pain.  Skin: Negative.  Negative for rash.  Neurological: Positive for weakness.  Endo/Heme/Allergies: Does not bruise/bleed easily.  Psychiatric/Behavioral: Positive for memory loss. The patient is not nervous/anxious.     As per HPI. Otherwise, a complete review of systems is negative.  PAST MEDICAL HISTORY: Past Medical History:    Diagnosis Date  . Arthritis   . Asthma   . Cancer [facility])    prostate cancer-treated medically  . COPD (chronic obstructive pulmonary disease) ([place])   . Foot drop, left   . Hernia, inguinal, left   . HOH (hard of hearing)   . Tremors of nervous system     PAST SURGICAL HISTORY: Past Surgical History:  Procedure Laterality Date  . ANKLE FRACTURE SURGERY  2009   lt  . ANKLE FRACTURE SURGERY     rt  . EYE SURGERY     both cataracts  . HEMORRHOID SURGERY    . JOINT REPLACEMENT  2000   rt hip replacement  . WRIST FRACTURE SURGERY Bilateral     FAMILY HISTORY: Family History  Problem Relation Age of Onset  . Heart attack Father   . Tremor Father   . Cancer Sister   . Stomach cancer Brother   . Heart attack Sister   . Heart attack Sister     ADVANCED DIRECTIVES (Y/N):  @ADVDIR @  HEALTH MAINTENANCE: Social History   Tobacco Use  . Smoking status: Never Smoker  . Smokeless tobacco: Never Used  Substance Use Topics  . Alcohol use: No  . Drug use: No     Colonoscopy:  PAP:  Bone density:  Lipid panel:  No Known Allergies  Current Facility-Administered Medications  Medication Dose Route Frequency Provider Last Rate Last Dose  . acetaminophen (TYLENOL) tablet 650 mg  650 mg Oral Q6H PRN Idelle Crouch, MD   650 mg at 04/13/17 1047   Or  . acetaminophen (TYLENOL) suppository 650 mg  650 mg Rectal Q6H PRN Fulton Reek  D, MD      . bisacodyl (DULCOLAX) suppository 10 mg  10 mg Rectal Daily PRN Idelle Crouch, MD      . docusate sodium (COLACE) capsule 100 mg  100 mg Oral BID Idelle Crouch, MD   100 mg at 04/13/17 1000  . LORazepam (ATIVAN) injection 2 mg  2 mg Intravenous Q2H PRN Max Sane, MD      . morphine 2 MG/ML injection 2 mg  2 mg Intravenous Q1H PRN Manuella Ghazi, Vipul, MD      . ondansetron (ZOFRAN) tablet 4 mg  4 mg Oral Q6H PRN Idelle Crouch, MD       Or  . ondansetron (ZOFRAN) injection 4 mg  4 mg Intravenous Q6H PRN Idelle Crouch, MD       . pantoprazole (PROTONIX) injection 40 mg  40 mg Intravenous Q12H Idelle Crouch, MD   40 mg at 04/13/17 1000    OBJECTIVE: Vitals:   04/13/17 0739 04/13/17 0749  BP:  (!) 103/54  Pulse:  (!) 107  Resp:  19  Temp:  98.2 F (36.8 C)  SpO2: 92% 100%     Body mass index is 26.4 kg/m.    ECOG FS:2 - Symptomatic, <50% confined to bed  General: Well-developed, well-nourished, no acute distress. Eyes: Pink conjunctiva, anicteric sclera. HEENT: Normocephalic, moist mucous membranes, clear oropharnyx. Lungs: Clear to auscultation bilaterally. Heart: Regular rate and rhythm. No rubs, murmurs, or gallops. Abdomen: Soft, nontender, nondistended. No organomegaly noted, normoactive bowel sounds. Musculoskeletal: No edema, cyanosis, or clubbing. Neuro: Alert, answering all questions appropriately. Cranial nerves grossly intact. Skin: No rashes or petechiae noted. Psych: Normal affect. Lymphatics: No cervical, calvicular, axillary or inguinal LAD.   LAB RESULTS:  Lab Results  Component Value Date   NA 134 (L) 04/13/2017   K 4.6 04/13/2017   CL 102 04/13/2017   CO2 18 (L) 04/13/2017   GLUCOSE 126 (H) 04/13/2017   BUN 52 (H) 04/13/2017   CREATININE 1.92 (H) 04/13/2017   CALCIUM 8.7 (L) 04/13/2017   PROT 5.4 (L) 04/13/2017   ALBUMIN 2.9 (L) 04/13/2017   AST 60 (H) 04/13/2017   ALT 35 04/13/2017   ALKPHOS 54 04/13/2017   BILITOT 2.4 (H) 04/13/2017   GFRNONAA 28 (L) 04/13/2017   GFRAA 33 (L) 04/13/2017    Lab Results  Component Value Date   WBC 10.8 (H) 04/13/2017   NEUTROABS 7.7 (H) 02/25/2017   HGB 9.0 (L) 04/13/2017   HCT 28.2 (L) 04/13/2017   MCV 90.9 04/13/2017   PLT 19 (LL) 04/13/2017     STUDIES: US Renal  Result Date: 04/13/2017 CLINICAL DATA:  Inpatient.  Acute renal failure.  Lymphoma. EXAM: RENAL / URINARY TRACT ULTRASOUND COMPLETE COMPARISON:  02/23/2017 PET-CT. FINDINGS: Right Kidney: Length: 9.4 cm. Echogenic and mildly atrophic right renal parenchyma.  No right hydronephrosis. No right renal mass. Left Kidney: Length: 10.1 cm. Echogenic and mildly atrophic left renal parenchyma. No left hydronephrosis. Exophytic simple 2.0 x 1.8 x 2.0 cm interpolar left renal cyst. No additional left renal lesions. Bladder: Appears normal for degree of bladder distention. IMPRESSION: 1. No hydronephrosis. 2. Echogenic and mildly atrophic kidneys bilaterally, compatible with nonspecific acute on chronic renal parenchymal disease. 3. Benign-appearing interpolar left renal cyst. 4. Normal bladder. Electronically Signed   By: Ilona Sorrel M.D.   On: 04/13/2017 10:41   Dg Chest Port 1 View  Result Date: 04/16/2017 CLINICAL DATA:  Atrial fibrillation. History of prostate cancer and B-cell  lymphoma. EXAM: PORTABLE CHEST 1 VIEW COMPARISON:  Chest radiograph April 12, 2015; PET-CT February 23, 2017 FINDINGS: There is atelectatic change in the right base. There is no edema or consolidation. Heart is mildly enlarged with pulmonary vascularity normal. No adenopathy is evident by radiography. There is aortic atherosclerosis. There is degenerative change in each shoulder. IMPRESSION: Aortic atherosclerosis. Atelectasis right base. No edema or consolidation. Heart mildly enlarged. Aortic Atherosclerosis (ICD10-I70.0). Electronically Signed   By: Lowella Grip III M.D.   On: 04/05/2017 11:56    ASSESSMENT: Progressive diffuse large B-cell lymphoma, progressive stage IV prostate cancer, thrombocytopenia, atrial fibrillation with RVR.  PLAN:    1. At least stage III diffuse large B-cell lymphoma: PET scan results from February 23, 2017 reviewed independently with essentially equivocal disease.  Patient declined any additional treatment at that time and has repeatedly refused aggressive chemotherapy.  Also given advanced age and decreased performance status, he likely would not tolerate treatment.  I suspect his worsening thrombocytopenia is secondary to disease progression.  Patient  can continue taking prednisone, but is unclear what dose he is taking at this point.  Palliative care and hospice are reasonable options at this point pending agreement with patient's family.  No follow-up is necessary in the cancer center, but he can keep his previously scheduled follow-up in March 2019 if desired.  2. 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 Patient's most recent PSA is trending up from 32.88 to 40.48.  His daughter expressed interest in reinitiating Xgeva, but if patient decides to pursue comfort care and hospice this will not be necessary.   3. Anemia: Patient's hemoglobin has decreased to his baseline.  Possibly secondary to progressive disease.  Will get hemolysis labs for completeness.  He does not require transfusion at this time.   4. Thrombocytopenia:  Significantly worse.  Likely due to progressive disease.  Monitor. 5. Bony metastasis: PET scan results from February 23, 2017 reviewed independently. Daughter previously requested to reinitiate Delton See, but this will be not be necessary if patient pursues comfort care and hospice. 6.  Rapid A. fib: Appreciate cardiology input.  Patient is not a good candidate for anticoagulation. 7.  Worsening creatinine: Likely secondary to dehydration, appreciate nephrology input.  Appreciate consult, will follow.  Lloyd Huger, MD   04/13/2017 1:45 PM

## 2017-04-13 NOTE — Progress Notes (Signed)
Patient's BP is running soft and is 93/65 this morning while his HR is running in the 130 s. Patient has Cardizem 30 mg oral this morning to take. Dr. Estanislado Pandy notified with a new order to go ahead and administer the Cardizem. No acute distress noted. Will continue to monitor.

## 2017-04-14 LAB — URINE CULTURE

## 2017-04-14 MED ORDER — SODIUM CHLORIDE 0.9 % IV SOLN
Freq: Once | INTRAVENOUS | Status: AC
  Administered 2017-04-14: 12:00:00 via INTRAVENOUS

## 2017-04-14 NOTE — Progress Notes (Signed)
Upon arrival nurse confirmed with family regarding spiritual care support. Chaplain was outside room and had discussion with son-in-law and a pastoral friend. Both confirm awareness of patients decline. Nurse reported daughter does not desire spiritual care support. Chaplain offered explanation of role of chaplain to son-in-law and pastoral friend. Spiritual care is available upon request.     04/14/17 1600  Clinical Encounter Type  Visited With Health care provider;Family  Visit Type Follow-up  Spiritual Encounters  Spiritual Needs Emotional

## 2017-04-14 NOTE — Progress Notes (Signed)
[facility] [place] NAME: Steven Greene    MR#:  [id number]  DATE OF BIRTH:  [birth year]  SUBJECTIVE:  CHIEF COMPLAINT:   Chief Complaint  Patient presents with  . Weakness  . Emesis  Seem to have more acute decline today versus yesterday.  Seem to be actively dying REVIEW OF SYSTEMS:  Review of Systems  Unable to perform ROS: Acuity of condition   DRUG ALLERGIES:  No Known Allergies VITALS:  Blood pressure 106/87, pulse 70, temperature 97.8 F (36.6 C), resp. rate (!) 24, height 5\' 11"  (1.803 m), weight 86.4 kg (190 lb 6.4 oz), SpO2 94 %. PHYSICAL EXAMINATION:  Physical Exam  Constitutional: He appears malnourished. He appears unhealthy. He has a sickly appearance.  HENT:  Head: Normocephalic and atraumatic.  Eyes: Conjunctivae and EOM are normal. Pupils are equal, round, and reactive to light.  Neck: Normal range of motion. Neck supple. No tracheal deviation present. No thyromegaly present.  Cardiovascular: Normal rate, regular rhythm and normal heart sounds.  Pulmonary/Chest: Accessory muscle usage present. Tachypnea noted. He is in respiratory distress. He has decreased breath sounds. He has no wheezes. He exhibits no tenderness.  Abdominal: Soft. Bowel sounds are normal. He exhibits no distension. There is no tenderness.  Musculoskeletal: Normal range of motion. He exhibits edema.  Neurological: No cranial nerve deficit.  comatose  Skin: Skin is warm and dry. No rash noted.  Psychiatric:  comatose  He seemed to be actively dying LABORATORY PANEL:  Male CBC Recent Labs  Lab 04/13/17 0359  WBC 10.8*  HGB 9.0*  HCT 28.2*  PLT 19*   ------------------------------------------------------------------------------------------------------------------ Chemistries  Recent Labs  Lab 04/13/17 0359  NA 134*  K 4.6  CL 102  CO2 18*  GLUCOSE 126*  BUN 52*  CREATININE 1.92*  CALCIUM 8.7*  AST 60*  ALT 35  ALKPHOS 54    BILITOT 2.4*   RADIOLOGY:  No results found. ASSESSMENT AND PLAN:  84 y.o. 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  Likely due to progressive disease.  Monitor.   He is very appropriate for Hospice. Spent long time with patient's son again today at bedside    We will keep him here for another 24 hours to see his clinical condition as he seemed to be actively dying here may not survive transportation.  If he is still alive we will get hospice to evaluate him for possible hospice home placement   All the records are reviewed and case discussed with Care Management/Social Worker. Management plans discussed with the patient, family (son, daughter at bedside) and they are in agreement.  CODE STATUS: DNR  TOTAL TIME TAKING CARE OF THIS PATIENT: 35 minutes.   More than 50% of the time was spent in counseling/coordination of care: YES  POSSIBLE D/C IN 1-2 DAYS, DEPENDING ON CLINICAL CONDITION.   Max Sane M.D on 04/14/2017 at 4:05 PM  Between 7am to 6pm - Pager - [phone]  After 6pm go to [email or web address] - Technical [employer]wer [facility]  Office  [phone]  CC: Primary care physician;  Jerrol Banana., MD  Note: This dictation was prepared with Dragon dictation along with smaller phrase technology. Any transcriptional errors that result from this process are unintentional.

## 2017-04-14 NOTE — Progress Notes (Signed)
Family Meeting Note  Advance Directive:yes  Today a meeting took place with the Patient and son, daughter at bedside.  Patient is unable to participate due DG:LOVFIE capacity Comatose   The following clinical team members were present during this meeting:MD  The following were discussed:Patient's diagnosis:   Patient has acute clinical decline since y'day and seem to be actively dying. All family is in agreement with comfort care and keep him here. If he survives till tomorrow, consider Hospice Home then.  Patient's progosis: < 2 weeks and Goals for treatment: DNR  Additional follow-up to be provided: comfort care only  Time spent during discussion:20 minutes  Max Sane, MD

## 2017-04-14 NOTE — Progress Notes (Signed)
[facility]  ROUNDING NOTE   Subjective:  Patient resting comfortably in bed. Remains on IV fluids at the moment. Family has opted for hospice at home.   Objective:  Vital signs in last 24 hours:  Temp:  [97.7 F (36.5 C)-97.8 F (36.6 C)] 97.8 F (36.6 C) (01/22 0331) Pulse Rate:  [70-94] 70 (01/22 0331) Resp:  [18-24] 24 (01/22 0331) BP: (106-115)/(84-87) 106/87 (01/22 0331) SpO2:  [94 %-95 %] 94 % (01/22 0331) Weight:  [86.4 kg (190 lb 6.4 oz)] 86.4 kg (190 lb 6.4 oz) (01/22 0331)  Weight change: 5.625 kg (12 lb 6.4 oz) Filed Weights   04/01/2017 1548 04/13/17 0352 04/14/17 0331  Weight: 85.7 kg (188 lb 14.4 oz) 85.9 kg (189 lb 4.8 oz) 86.4 kg (190 lb 6.4 oz)    Intake/Output: I/O last 3 completed shifts: In: 1298 [P.O.:480; I.V.:309; Blood:309; IV Piggyback:200] Out: 400 [Urine:400]   Intake/Output this shift:  No intake/output data recorded.  Physical Exam: General: No acute distress  Head: Normocephalic, atraumatic. Moist oral mucosal membranes  Eyes: Anicteric  Neck: Supple, trachea midline  Lungs:  Clear to auscultation, normal effort  Heart: S1S2 irregular  Abdomen:  Soft, nontender, bowel sounds present  Extremities: 2+ peripheral edema, dependent rubor  Neurologic: Resting comfortably  Skin: Dependent rubor bilateral LE's       Basic Metabolic Panel: Recent Labs  Lab 04/18/2017 1043 04/13/17 0359  NA 136 134*  K 5.1 4.6  CL 103 102  CO2 20* 18*  GLUCOSE 145* 126*  BUN 48* 52*  CREATININE 1.72* 1.92*  CALCIUM 9.2 8.7*    Liver Function Tests: Recent Labs  Lab 03/24/2017 1043 04/13/17 0359  AST 38 60*  ALT 21 35  ALKPHOS 61 54  BILITOT 1.2 2.4*  PROT 6.1* 5.4*  ALBUMIN 3.3* 2.9*   Recent Labs  Lab 03/25/2017 1043  LIPASE 52*   No results for input(s): AMMONIA in the last 168 hours.  CBC: Recent Labs  Lab 03/28/2017 1043 03/24/2017 1421 04/13/17 0359  WBC 9.9  --  10.8*  HGB 11.0*  --  9.0*  HCT 34.5*  --  28.2*   MCV 91.1  --  90.9  PLT 9* 10* 19*    Cardiac Enzymes: Recent Labs  Lab 03/24/2017 1043 04/13/2017 1632 04/22/2017 2238 04/13/17 0359  TROPONINI 0.34* 0.09* 0.11* 0.10*    BNP: Invalid input(s): POCBNP  CBG: No results for input(s): GLUCAP in the last 168 hours.  Microbiology: Results for orders placed or performed during the hospital encounter of 04/10/2017  Culture, blood (routine x 2)     Status: None (Preliminary result)   Collection Time: 04/03/2017 11:20 AM  Result Value Ref Range Status   Specimen Description BLOOD LEFT ARM  Final   Special Requests   Final    BOTTLES DRAWN AEROBIC AND ANAEROBIC Blood Culture results may not be optimal due to an excessive volume of blood received in culture bottles   Culture   Final    NO GROWTH 2 DAYS Performed at [facility], [address], [place], [place] [address]    Report Status PENDING  Incomplete  Culture, blood (routine x 2)     Status: None (Preliminary result)   Collection Time: 04/17/2017 11:21 AM  Result Value Ref Range Status   Specimen Description BLOOD RIGHT ARM  Final   Special Requests   Final    BOTTLES DRAWN AEROBIC AND ANAEROBIC Blood Culture adequate volume   Culture  Final    NO GROWTH 2 DAYS Performed at [facility], [address] [place], [place] [address]    Report Status PENDING  Incomplete  Urine culture     Status: Abnormal   Collection Time: 04/03/2017  8:30 PM  Result Value Ref Range Status   Specimen Description   Final    URINE, RANDOM Performed at [facility], [address] [place], [place] [address]    Special Requests   Final    NONE Performed at [facility], [address] [place], [place] [address]    Culture (A)  Final    <10,000 COLONIES/mL INSIGNIFICANT GROWTH Performed at [facility], [address] [place], [place] [address]    Report Status 04/14/2017 FINAL  Final    Coagulation Studies: No results for input(s): LABPROT,  INR in the last 72 hours.  Urinalysis: Recent Labs    03/24/2017 2030  COLORURINE YELLOW*  LABSPEC 1.009  PHURINE 5.0  GLUCOSEU NEGATIVE  HGBUR SMALL*  BILIRUBINUR NEGATIVE  KETONESUR NEGATIVE  PROTEINUR NEGATIVE  NITRITE NEGATIVE  LEUKOCYTESUR NEGATIVE      Imaging: US Renal  Result Date: 04/13/2017 CLINICAL DATA:  Inpatient.  Acute renal failure.  Lymphoma. EXAM: RENAL / URINARY TRACT ULTRASOUND COMPLETE COMPARISON:  02/23/2017 PET-CT. FINDINGS: Right Kidney: Length: 9.4 cm. Echogenic and mildly atrophic right renal parenchyma. No right hydronephrosis. No right renal mass. Left Kidney: Length: 10.1 cm. Echogenic and mildly atrophic left renal parenchyma. No left hydronephrosis. Exophytic simple 2.0 x 1.8 x 2.0 cm interpolar left renal cyst. No additional left renal lesions. Bladder: Appears normal for degree of bladder distention. IMPRESSION: 1. No hydronephrosis. 2. Echogenic and mildly atrophic kidneys bilaterally, compatible with nonspecific acute on chronic renal parenchymal disease. 3. Benign-appearing interpolar left renal cyst. 4. Normal bladder. Electronically Signed   By: Delbert Phenix M.D.   On: 04/13/2017 10:41     Medications:    . docusate sodium  100 mg Oral BID  . pantoprazole (PROTONIX) IV  40 mg Intravenous Q12H   acetaminophen **OR** acetaminophen, bisacodyl, LORazepam, morphine injection, ondansetron **OR** ondansetron (ZOFRAN) IV  Assessment/ Plan:  84 y.o. male with a PMHx of progressive B-cell lymphoma and prostate cancer, COPD, asthma, chronic bilateral lower extremity edema, left foot drop who was admitted to [facility] on 03/28/2017 for evaluation of decreased p.o. intake and malaise.  Patient found to have acute renal failure as well as thrombocytopenia.  1.  Acute renal failure suspect secondary to decreased p.o. intake. 2.  Anemia unspecified, thrombocytopenia. 3.  Bilateral lower extremity edema with erythematous lower extremities. 4.  Progressive B-cell  lymphoma as well as prostate cancer.  Plan: Patient seen at bedside.  He appears to be comfortable at the moment.  No new blood draws have been performed as the patient being transitioned to hospice.  Patient now taken off of IV fluids.  Case was discussed with the patient's son and we advised him as to what to expect going forward.  We wish the patient and his family the best.  Thanks for allowing [place] to participate, will sign off.      LOS: 2 Steven Greene 1/22/201911:48 AM

## 2017-04-14 NOTE — Progress Notes (Signed)
Grievance cart ordered for patient's family at start of shift and IVF discontinued per family request. PRN morphine and ativan given to keep patient comfortable. Family at bedside. No S/S of distress noted at this time and patient is resting comfortably in bed. Nursing staff will continue to monitor for any changes in patient status. Earleen Reaper, RN

## 2017-04-14 NOTE — Care Management (Signed)
Notified by staff this morning that family was requesting resources for 24 hours care in the home.  Per MD patient status is declining and is actively dying.  RNCM will follow to determine if resources are appropriate.

## 2017-04-17 ENCOUNTER — Other Ambulatory Visit: Payer: Self-pay | Admitting: Nurse Practitioner

## 2017-04-17 LAB — CULTURE, BLOOD (ROUTINE X 2)
Culture: NO GROWTH
Culture: NO GROWTH
Special Requests: ADEQUATE

## 2017-04-24 NOTE — Death Summary Note (Signed)
DEATH SUMMARY   Patient Details  Name: Steven Greene MRN: [id number] DOB: 03-07-1920  Admission/Discharge Information   Admit Date:  Apr 24, 2017  Date of Death: Date of Death: 04/27/17  Time of Death: Time of Death: 2023-02-13  Length of Stay: 3  Referring Physician: Maple Hudson., MD   Reason(s) for Hospitalization  SOB, N/V and LE edema  Diagnoses  Preliminary cause of death: Lymphoma Secondary Diagnoses (including complications and co-morbidities):  Principal Problem:   Atrial fibrillation with RVR (HCC) Active Problems:   DLBCL (diffuse large B cell lymphoma) (HCC)   Thrombocytopenia (HCC)   ARF (acute renal failure) [facility])   Brief Hospital Course (including significant findings, care, treatment, and services provided and events leading to death)  Steven Greene is a 84 y.o. 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 Likely due to progressive disease.  Pertinent Labs and Studies  Significant Diagnostic Studies US Renal  Result Date: 04/13/2017 CLINICAL DATA:  Inpatient.  Acute renal failure.  Lymphoma. EXAM: RENAL / URINARY TRACT ULTRASOUND COMPLETE COMPARISON:  02/23/2017 PET-CT. FINDINGS: Right Kidney: Length: 9.4 cm. Echogenic and mildly atrophic right renal parenchyma. No right hydronephrosis. No right renal mass. Left Kidney: Length: 10.1 cm. Echogenic and mildly atrophic left renal parenchyma. No left hydronephrosis. Exophytic simple 2.0 x 1.8 x 2.0 cm interpolar left renal cyst. No additional left renal lesions. Bladder: Appears normal for degree of bladder distention. IMPRESSION: 1. No  hydronephrosis. 2. Echogenic and mildly atrophic kidneys bilaterally, compatible with nonspecific acute on chronic renal parenchymal disease. 3. Benign-appearing interpolar left renal cyst. 4. Normal bladder. Electronically Signed   By: Delbert Phenix M.D.   On: 04/13/2017 10:41   Dg Chest Port 1 View  Result Date: Apr 24, 2017 CLINICAL DATA:  Atrial fibrillation. History of prostate cancer and B-cell lymphoma. EXAM: PORTABLE CHEST 1 VIEW COMPARISON:  Chest radiograph April 12, 2015; PET-CT February 23, 2017 FINDINGS: There is atelectatic change in the right base. There is no edema or consolidation. Heart is mildly enlarged with pulmonary vascularity normal. No adenopathy is evident by radiography. There is aortic atherosclerosis. There is degenerative change in each shoulder. IMPRESSION: Aortic atherosclerosis. Atelectasis right base. No edema or consolidation. Heart mildly enlarged. Aortic Atherosclerosis (ICD10-I70.0). Electronically Signed   By: Bretta Bang III M.D.   On: 2017-04-24 11:56    Microbiology Recent Results (from the past 240 hour(s))  Culture, blood (routine x 2)     Status: None   Collection Time: 2017-04-24 11:20 AM  Result Value Ref Range Status   Specimen Description BLOOD LEFT ARM  Final   Special Requests   Final    BOTTLES DRAWN AEROBIC AND ANAEROBIC Blood Culture results may not be optimal due to an excessive volume of blood received in culture bottles   Culture   Final    NO GROWTH 5 DAYS Performed at [facility], [address] [place], [place] [address]    Report Status 04/17/2017 FINAL  Final  Culture, blood (routine x 2)     Status: None   Collection Time: Apr 24, 2017 11:21 AM  Result Value Ref Range Status   Specimen Description BLOOD RIGHT  ARM  Final   Special Requests   Final    BOTTLES DRAWN AEROBIC AND ANAEROBIC Blood Culture adequate volume   Culture   Final    NO GROWTH 5 DAYS Performed at [facility], [address]  [place], [place] [address]    Report Status 04/17/2017 FINAL  Final  Urine culture     Status: Abnormal   Collection Time: 04/07/2017  8:30 PM  Result Value Ref Range Status   Specimen Description   Final    URINE, RANDOM Performed at [facility], [address] [place], [place] [address]    Special Requests   Final    NONE Performed at [facility], [address], [place], [place] [address]    Culture (A)  Final    <10,000 COLONIES/mL INSIGNIFICANT GROWTH Performed at [facility], [address], [place], [place] [address]    Report Status 04/14/2017 FINAL  Final    Lab Basic Metabolic Panel: Recent Labs  Lab 04/05/2017 1043 04/13/17 0359  NA 136 134*  K 5.1 4.6  CL 103 102  CO2 20* 18*  GLUCOSE 145* 126*  BUN 48* 52*  CREATININE 1.72* 1.92*  CALCIUM 9.2 8.7*   Liver Function Tests: Recent Labs  Lab 04/14/2017 1043 04/13/17 0359  AST 38 60*  ALT 21 35  ALKPHOS 61 54  BILITOT 1.2 2.4*  PROT 6.1* 5.4*  ALBUMIN 3.3* 2.9*   Recent Labs  Lab 03/29/2017 1043  LIPASE 52*   No results for input(s): AMMONIA in the last 168 hours. CBC: Recent Labs  Lab 04/10/2017 1043 04/02/2017 1421 04/13/17 0359  WBC 9.9  --  10.8*  HGB 11.0*  --  9.0*  HCT 34.5*  --  28.2*  MCV 91.1  --  90.9  PLT 9* 10* 19*   Cardiac Enzymes: Recent Labs  Lab 04/01/2017 1043 03/25/2017 1632 04/23/2017 2238 04/13/17 0359  TROPONINI 0.34* 0.09* 0.11* 0.10*   Sepsis Labs: Recent Labs  Lab 04/23/2017 1043 04/22/2017 1120 04/01/2017 1305 04/13/17 0359  WBC 9.9  --   --  10.8*  LATICACIDVEN  --  2.9* 2.5*  --     Procedures/Operations  none   Loredana Medellin Sherryll Burger 04/18/2017, 4:39 PM

## 2017-04-24 NOTE — H&P (Signed)
[place] at [facility]    Death Note  Pt examined and noted with HR 0; RR 0; BP 0. Asystole is noted on tele monitor.  In breif -please see Last Note for all details.  Last Note  [facility] NAME: Steven Greene    MR#:  [id number]  DATE OF BIRTH:  1919/07/28  SUBJECTIVE:  CHIEF COMPLAINT:      Chief Complaint  Patient presents with  . Weakness  . Emesis  Seem to have more acute decline today versus yesterday.  Seem to be actively dying REVIEW OF SYSTEMS:  Review of Systems  Unable to perform ROS: Acuity of condition   DRUG ALLERGIES:  No Known Allergies VITALS:  Blood pressure 106/87, pulse 70, temperature 97.8 F (36.6 C), resp. rate (!) 24, height 5\' 11"  (1.803 m), weight 86.4 kg (190 lb 6.4 oz), SpO2 94 %. PHYSICAL EXAMINATION:  Physical Exam  Constitutional: He appears malnourished. He appears unhealthy. He has a sickly appearance.  HENT:  Head: Normocephalic and atraumatic.  Eyes: Conjunctivae and EOM are normal. Pupils are equal, round, and reactive to light.  Neck: Normal range of motion. Neck supple. No tracheal deviation present. No thyromegaly present.  Cardiovascular: Normal rate, regular rhythm and normal heart sounds.  Pulmonary/Chest: Accessory muscle usage present. Tachypnea noted. He is in respiratory distress. He has decreased breath sounds. He has no wheezes. He exhibits no tenderness.  Abdominal: Soft. Bowel sounds are normal. He exhibits no distension. There is no tenderness.  Musculoskeletal: Normal range of motion. He exhibits edema.  Neurological: No cranial nerve deficit.  comatose  Skin: Skin is warm and dry. No rash noted.  Psychiatric:  comatose  He seemed to be actively dying LABORATORY PANEL:  Male CBC LastLabs     Recent Labs  Lab 04/13/17 0359  WBC 10.8*  HGB 9.0*  HCT 28.2*  PLT 19*      ------------------------------------------------------------------------------------------------------------------ Chemistries  LastLabs     Recent Labs  Lab 04/13/17 0359  NA 134*  K 4.6  CL 102  CO2 18*  GLUCOSE 126*  BUN 52*  CREATININE 1.92*  CALCIUM 8.7*  AST 60*  ALT 35  ALKPHOS 54  BILITOT 2.4*     RADIOLOGY:  No results found. 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 Likely due to progressive disease. Monitor.   Pronounced dead by Dr Duane Boston on 2017-05-01     @     00:10         Cause of death  Cardiac arrest   Amelia Jo M.D on 05-01-17 at 12:22 AM  [facility] [place]  Total clinical and documentation time for today Under 30 minutes

## 2017-04-24 NOTE — Progress Notes (Signed)
Patient passed with family at bedside. MD Duane Boston and [facility] notified; MD pronounced TOD at [facility] on 05-06-2017. [facility] notified and post-mortem checklist completed. Bed control notified to transport patient to the morgue. Earleen Reaper, RN

## 2017-04-24 DEATH — deceased

## 2017-05-26 ENCOUNTER — Other Ambulatory Visit: Payer: Self-pay

## 2017-05-26 ENCOUNTER — Ambulatory Visit: Payer: Self-pay | Admitting: Oncology

## 2017-05-26 ENCOUNTER — Ambulatory Visit: Payer: Self-pay

## 2017-06-11 LAB — BPAM PLATELET PHERESIS
Blood Product Expiration Date: 201901202359
ISSUE DATE / TIME: 201901201830
Unit Type and Rh: 5100

## 2017-06-11 LAB — PREPARE PLATELET PHERESIS: UNIT DIVISION: 0

## 2018-09-09 IMAGING — CT NM PET TUM IMG INITIAL (PI) SKULL BASE T - THIGH
10 series · 22 of 25 positions shown · non-contrast
Comparison: Bone scan of 04/15/2016. Chest radiograph of
04/12/2015.

CLINICAL DATA: Initial treatment strategy for diffuse large B-cell
lymphoma of axilla. Initial staging. Metastatic prostate cancer to
bone..

EXAM:
NUCLEAR MEDICINE PET SKULL BASE TO THIGH
TECHNIQUE: 13.1 mCi F-18 FDG was injected intravenously. Full-ring PET imaging
was performed from the skull base to thigh after the radiotracer. CT
data was obtained and used for attenuation correction and anatomic
localization.
FASTING BLOOD GLUCOSE:  Value: 110 mg/dl

[Series 3: ct wb 5.0 b30f · axial · 5.0mm · 0.98mm/px · z∈[-1386,-520]mm · 3 of 290 slices shown]
[im 1/290]
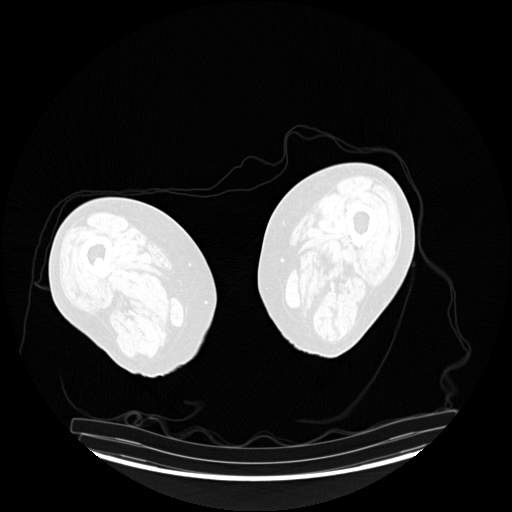
[im 145/290]
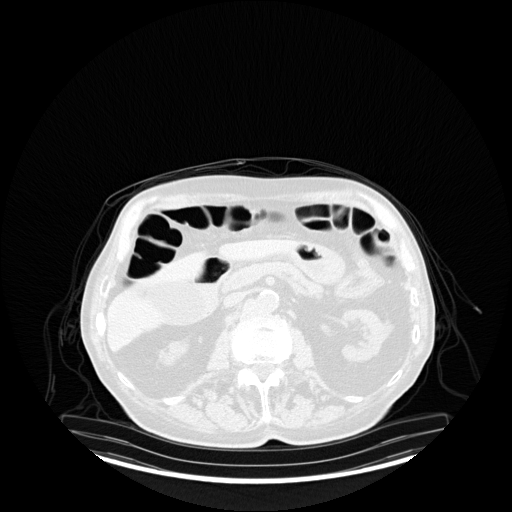
[im 290/290]
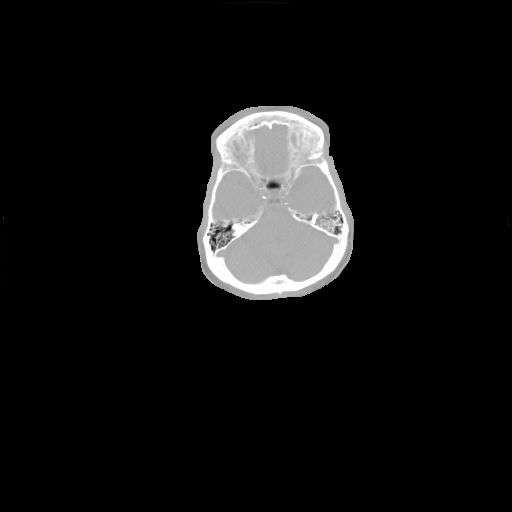

[Series 4: pet wb (ac) · axial · 5.0mm · 4.07mm/px · z∈[-1386,-520]mm · 2 of 290 slices shown]
[im 1/290]
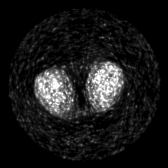
[im 290/290]
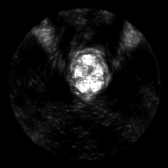

[Series 5: pet wb uncorrected (nac) · axial · 5.0mm · 4.07mm/px · z∈[-1386,-520]mm · 3 of 290 slices shown]
[im 1/290]
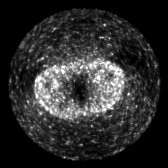
[im 145/290]
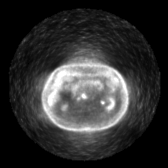
[im 290/290]
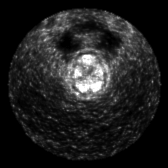

[Series 603: pet axial fused · 3 of 290 slices shown]
[im 1/290]
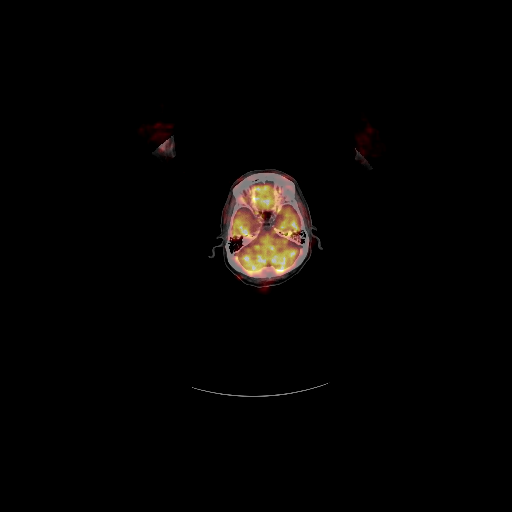
[im 145/290]
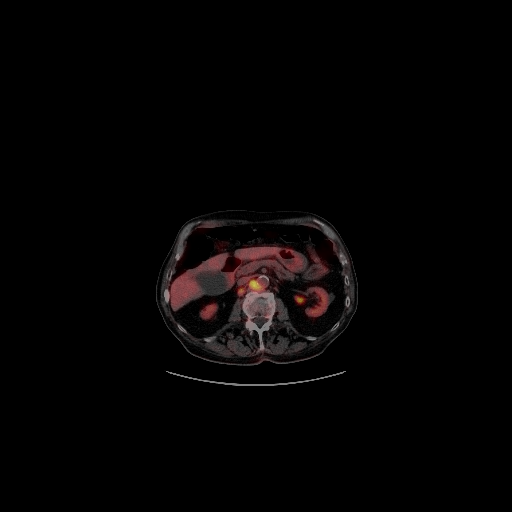
[im 290/290]
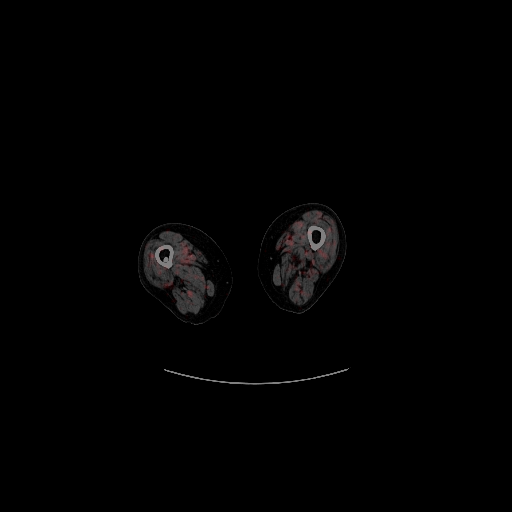

[Series 604: pet coronal fused · 1 of 129 slices shown]
[im 129/129]
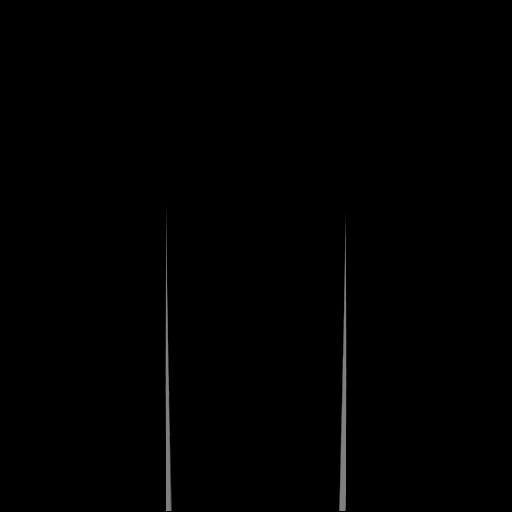

[Series 605: pet sagittal fused · 2 of 136 slices shown]
[im 1/136]
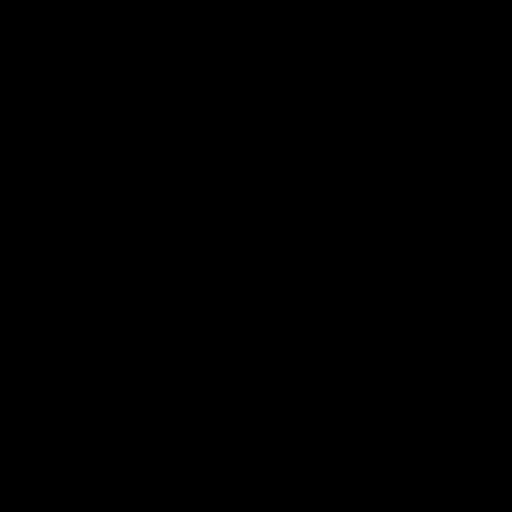
[im 136/136]
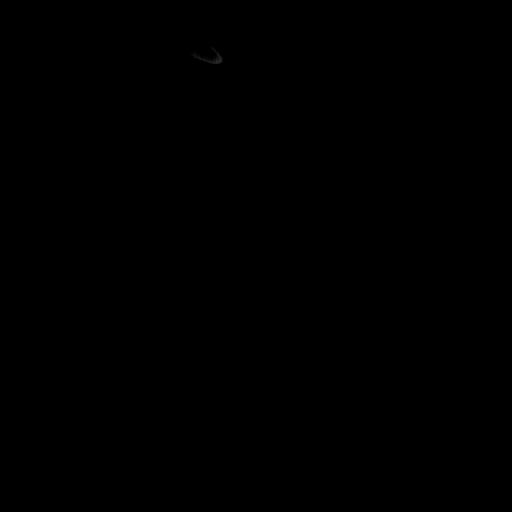

[Series 606: pet axial · 4 of 288 slices shown]
[im 1/288]
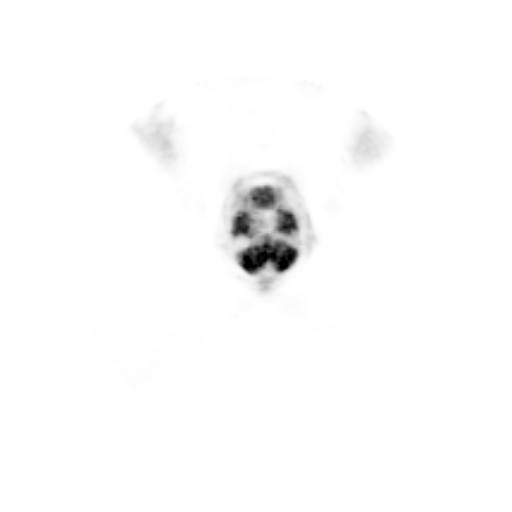
[im 96/288]
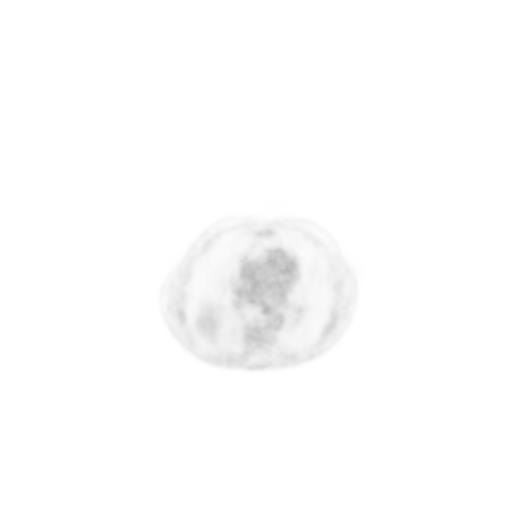
[im 192/288]
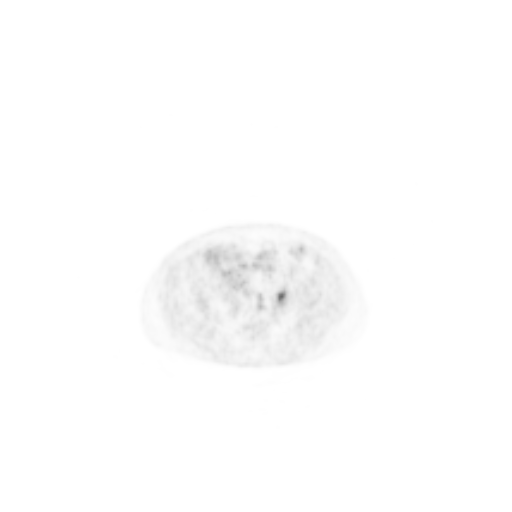
[im 288/288]
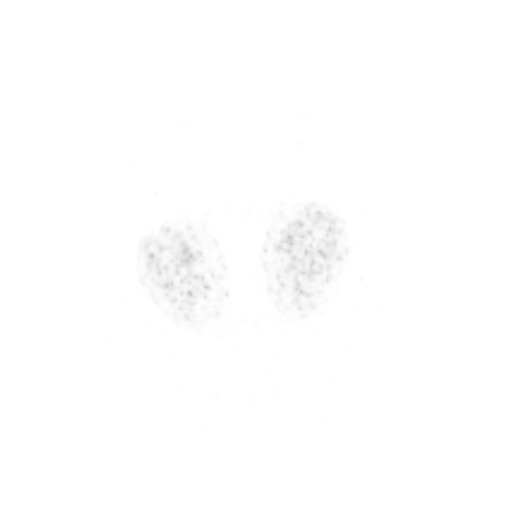

[Series 607: pet coronal · 1 of 143 slices shown]
[im 143/143]
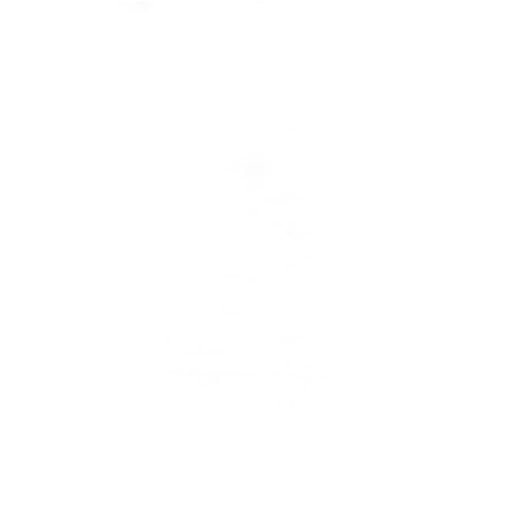

[Series 608: pet sagittal · 2 of 153 slices shown]
[im 1/153]
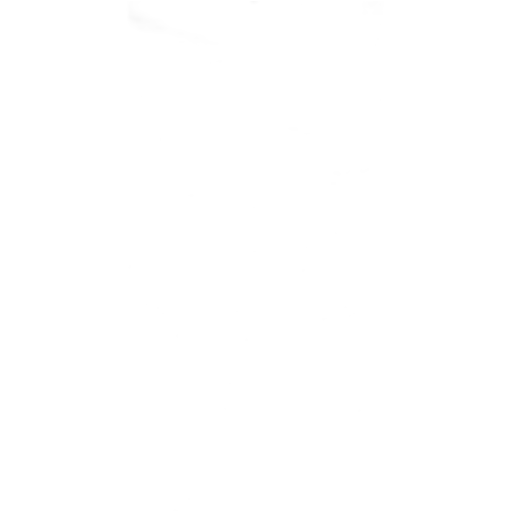
[im 153/153]
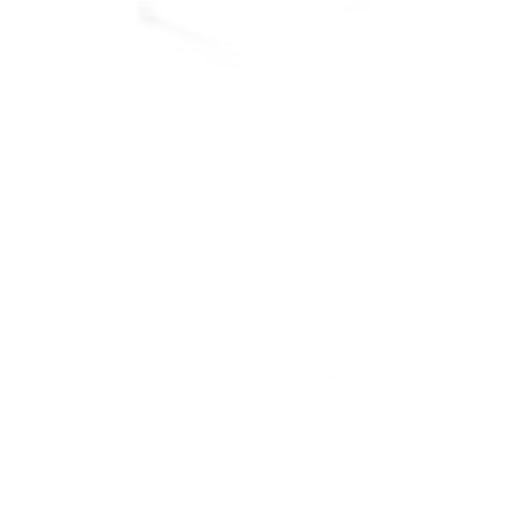

[Series 1046: results mm oncology reading · 3.0mm · 0.95mm/px · 1 of 9 slices shown]
[im 1/9]
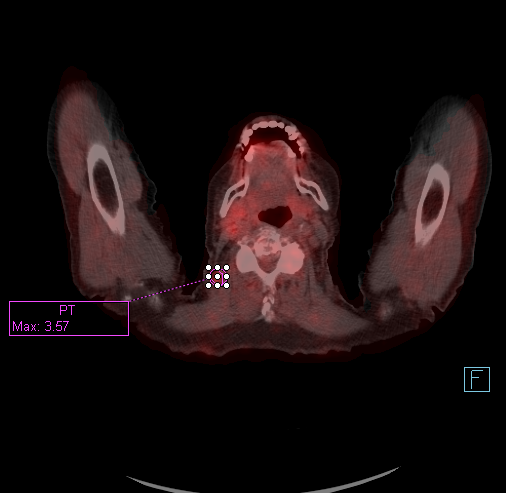

[22 of 25 positions shown; findings below may reference images not displayed]

FINDINGS: NECK

Small right sided cervical hypermetabolic nodes. Example right
posterior triangle node which measures 8 mm and a S.U.V. max of
on image 32/series 3.

Mucosal thickening of both maxillary sinuses. No cervical
adenopathy.

CHEST

Hypermetabolic bilateral axillary bulky adenopathy. Index left
axillary node measures 4.4 x 4.7 cm and a S.U.V. max of 15.3.

Mediastinal and right hilar nodal hypermetabolism, including at a
S.U.V. max of 5.0 within the right hilum.

Retrocrural node measures 1.8 cm and a S.U.V. max of 1 9.9 on image
119/series 3. Mild cardiomegaly. Aortic valvular calcifications. Lad
and probable left circumflex coronary artery atherosclerosis. Small
hiatal hernia. Bibasilar scarring.

ABDOMEN/PELVIS

Extensive abdominopelvic hypermetabolic adenopathy. Index aortocaval
node measures 1.7 cm a S.U.V. max of 11.9 on image 156/series 3.
Small bowel mesenteric node measures 1.8 cm and a S.U.V. max of
10.1.

Left greater than right pelvic hypermetabolic adenopathy, including
a left inguinal node which measures 2.4 cm and a S.U.V. max of 16.2.
Left renal cysts. Right worse than left renal atrophy. Normal
adrenal glands. Degraded evaluation of the pelvis, secondary to beam
hardening artifact from right hip arthroplasty. Moderate
prostatomegaly. Fat containing left inguinal hernia.

SKELETON

No abnormal marrow activity. Right hip arthroplasty. L2 and L3
compression deformities without ventral canal encroachment. Seventh
anterior left rib irregularity may correspond to the presumed
metastasis on prior bone scan. Right proximal femoral sclerosis is
also likely indicative of metastasis, given bone scan appearance.
IMPRESSION: 1. Hypermetabolic adenopathy in the neck, chest, abdomen, and
pelvis. Presumably all related to lymphoma. Nodal metastasis from
prostate carcinoma could look similar.
2.  Coronary artery atherosclerosis. Aortic atherosclerosis.
3. Aortic valvular calcifications. Consider echocardiography to
evaluation for valvular dysfunction.
4. Presumed osseous metastasis in the proximal right femur.
